# Patient Record
Sex: Male | Born: 1965 | Race: White | Hispanic: No | Marital: Single | Smoking: Current every day smoker
Health system: Southern US, Community
[De-identification: ages and names within clinical notes are randomized; demographics above are authoritative.]

## PROBLEM LIST (undated history)

## (undated) DIAGNOSIS — I509 Heart failure, unspecified: Secondary | ICD-10-CM

## (undated) DIAGNOSIS — I10 Essential (primary) hypertension: Secondary | ICD-10-CM

## (undated) DIAGNOSIS — G4730 Sleep apnea, unspecified: Secondary | ICD-10-CM

## (undated) HISTORY — DX: Heart failure, unspecified: I50.9

## (undated) HISTORY — PX: NO PAST SURGERIES: SHX2092

## (undated) HISTORY — DX: Essential (primary) hypertension: I10

## (undated) HISTORY — DX: Sleep apnea, unspecified: G47.30

---

## 2007-02-01 ENCOUNTER — Emergency Department (HOSPITAL_COMMUNITY): Admission: EM | Admit: 2007-02-01 | Discharge: 2007-02-01 | Payer: Self-pay | Admitting: Emergency Medicine

## 2007-02-01 IMAGING — CR DG HAND COMPLETE 3+V*R*
3 series · 3 of 3 positions shown · non-contrast
Comparison: none

CLINICAL DATA: Injury, pain

RIGHT HAND - 3 VIEW

[x hand pa right]
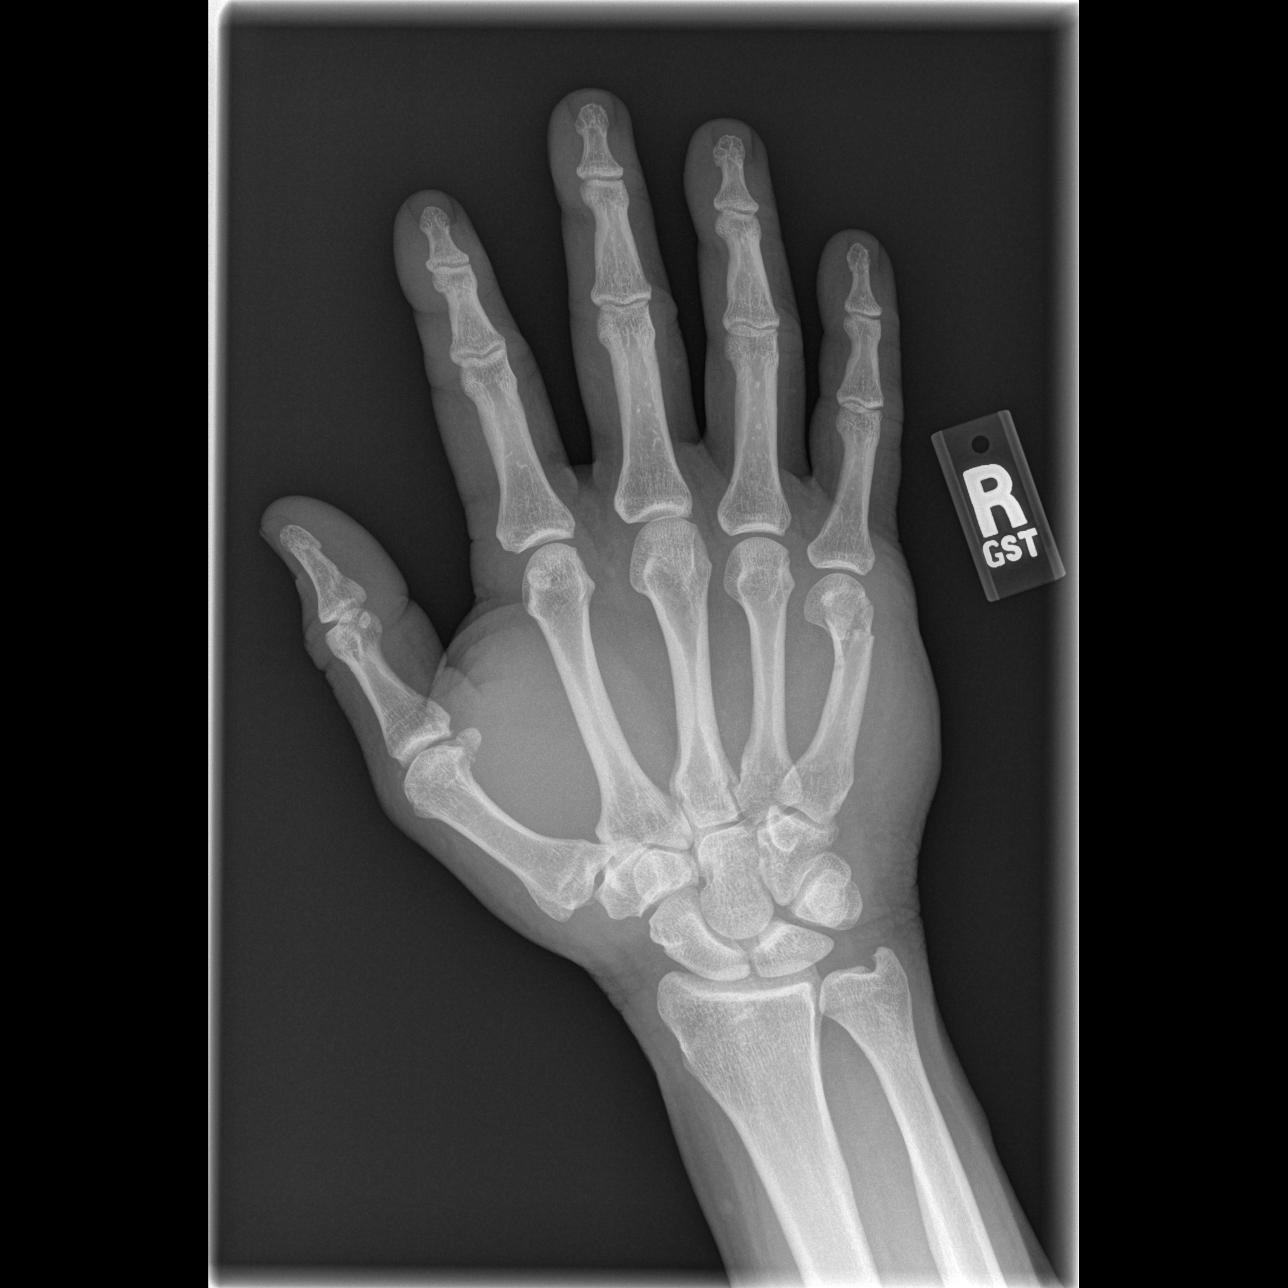

[x hand oblique right]
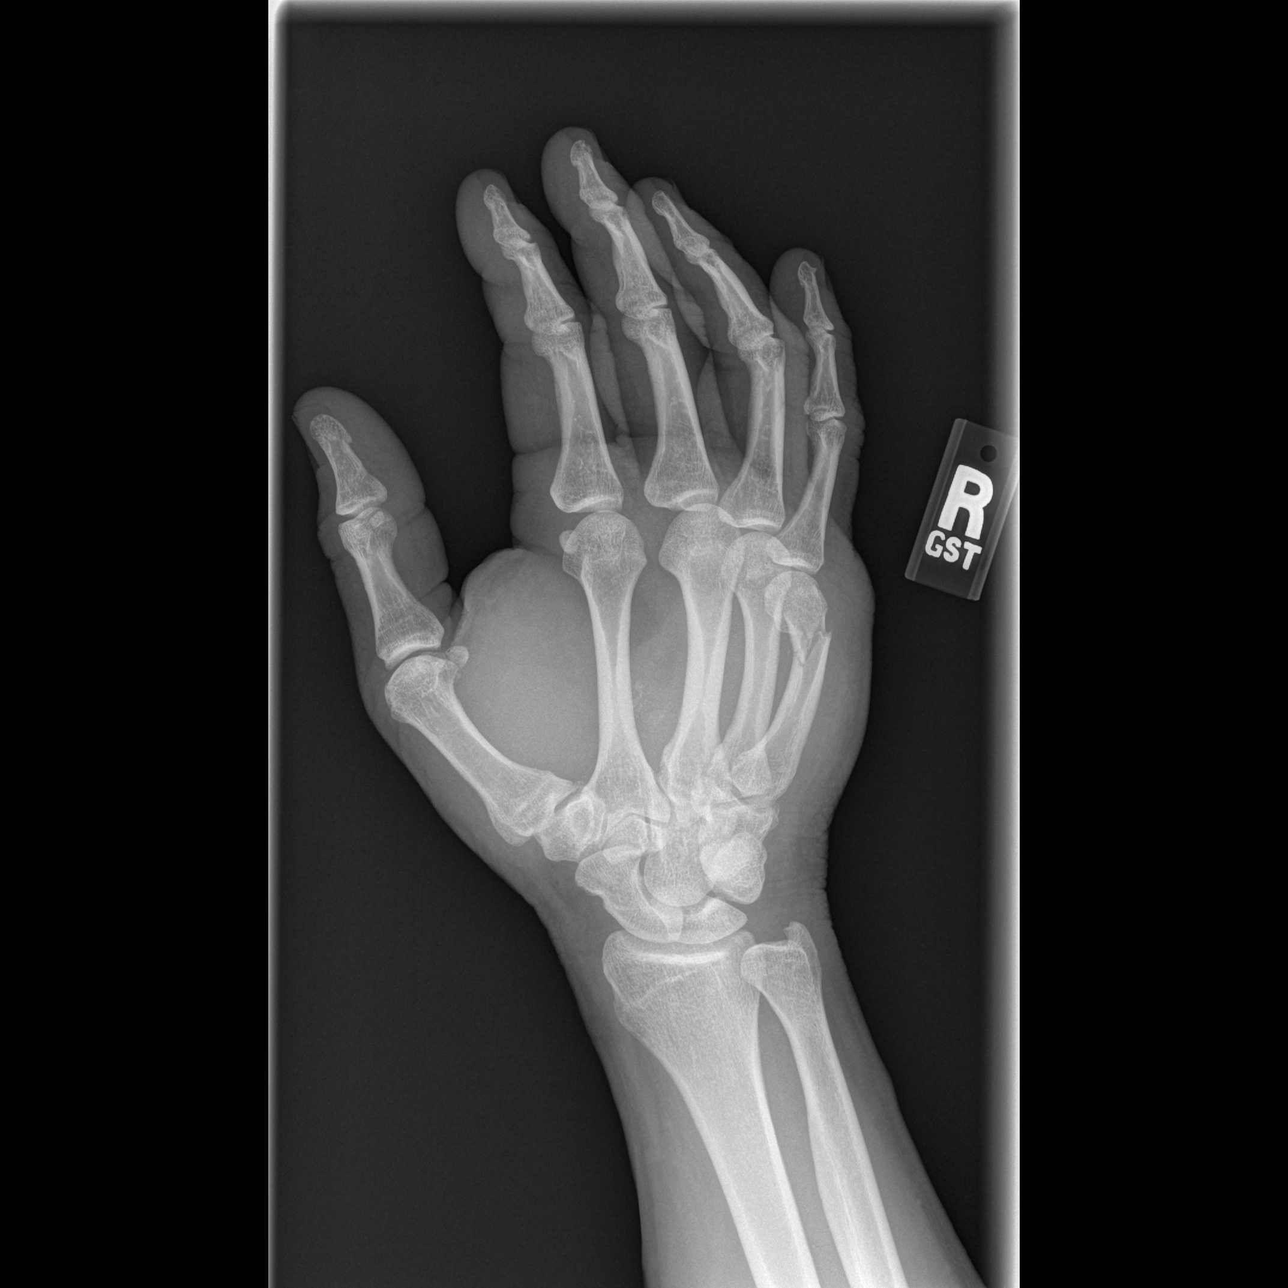

[x hand lat right]
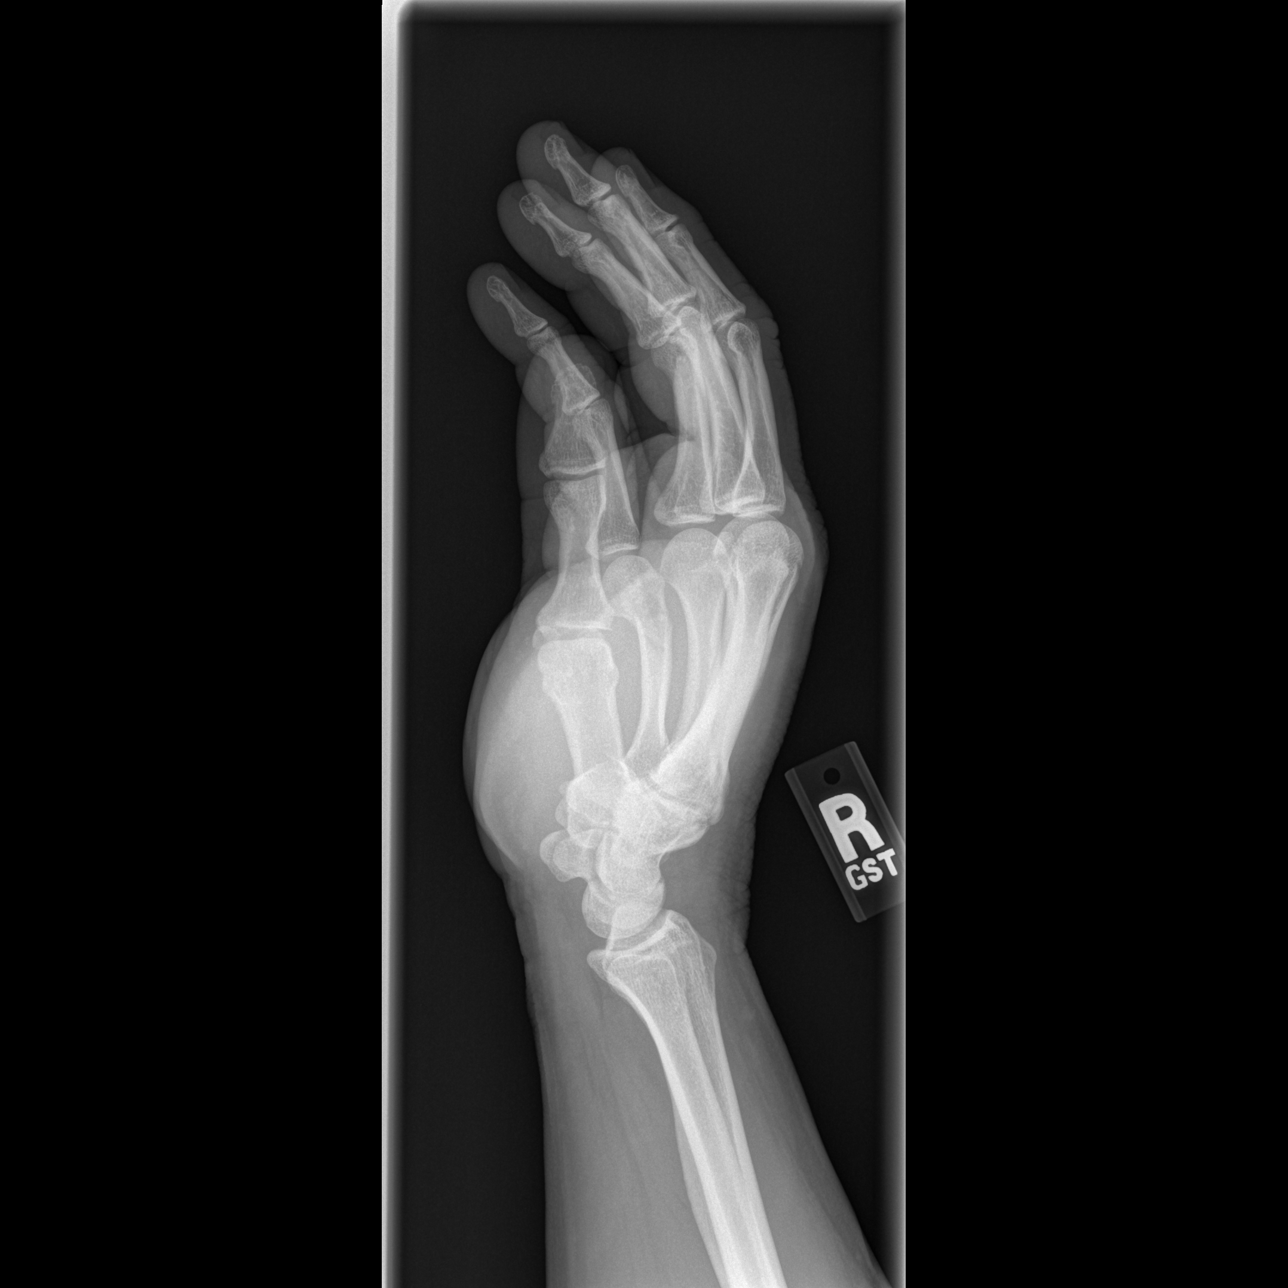

[3 of 3 positions shown; findings below may reference images not displayed]

FINDINGS: There is a comminuted fracture noted through the distal right fifth
metacarpal. Approximately 40 degrees of angulation. Overlying soft tissue
swelling. No acute bony abnormality.

IMPRESSION

Mildly comminuted and angulated distal right fifth metacarpal fracture.

## 2007-03-02 ENCOUNTER — Encounter: Payer: Self-pay | Admitting: Cardiology

## 2007-03-02 ENCOUNTER — Ambulatory Visit: Payer: Self-pay | Admitting: Cardiology

## 2007-03-02 ENCOUNTER — Inpatient Hospital Stay (HOSPITAL_COMMUNITY): Admission: EM | Admit: 2007-03-02 | Discharge: 2007-03-07 | Payer: Self-pay | Admitting: Emergency Medicine

## 2007-03-02 IMAGING — CR DG CHEST 1V PORT
1 series · 1 of 1 positions shown · non-contrast
Comparison: None available.

CLINICAL DATA: Chest pain.  
 PORTABLE SEATED AP CHEST - 1 VIEW [DATE]:

[view not recorded]
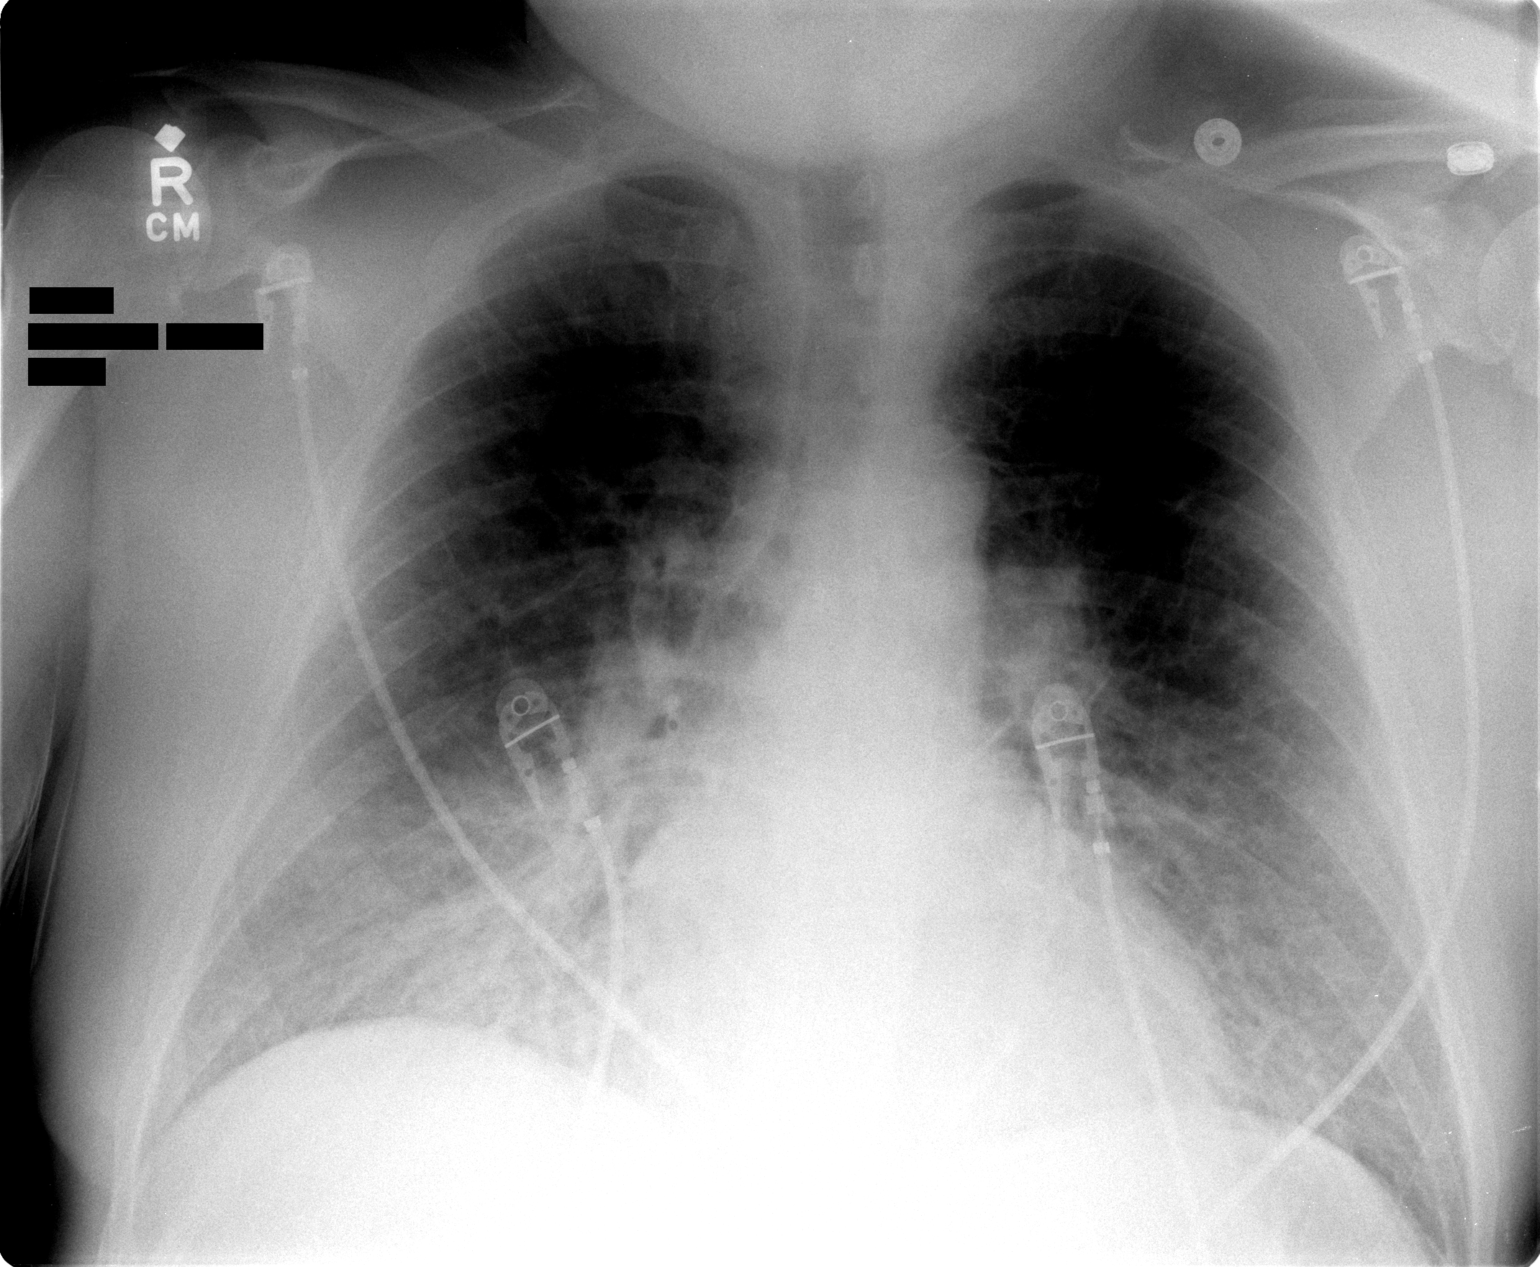

[1 of 1 positions shown; findings below may reference images not displayed]

FINDINGS: Cardio-pericardial silhouette is at upper limits of normal for projection. There is symmetric bilateral basilar airspace disease radiating out from the hila.  No pleural effusions are identified.  Left costophrenic angle excluded.
IMPRESSION: Findings most consistent with congestive heart failure with bibasilar pulmonary edema.  Findings were discussed with Dr. LAURENZZO of the Emergency Room at [82] hours on the day of exam.

## 2007-03-03 IMAGING — CR DG CHEST 2V
2 series · 2 of 2 positions shown · non-contrast
Comparison: Portable view, [DATE].

CLINICAL DATA: 40 year old male with congestive heart failure, shortness of breath, and chest pain. 
 CHEST - 2 VIEW - [DATE]:

[w chest pa]
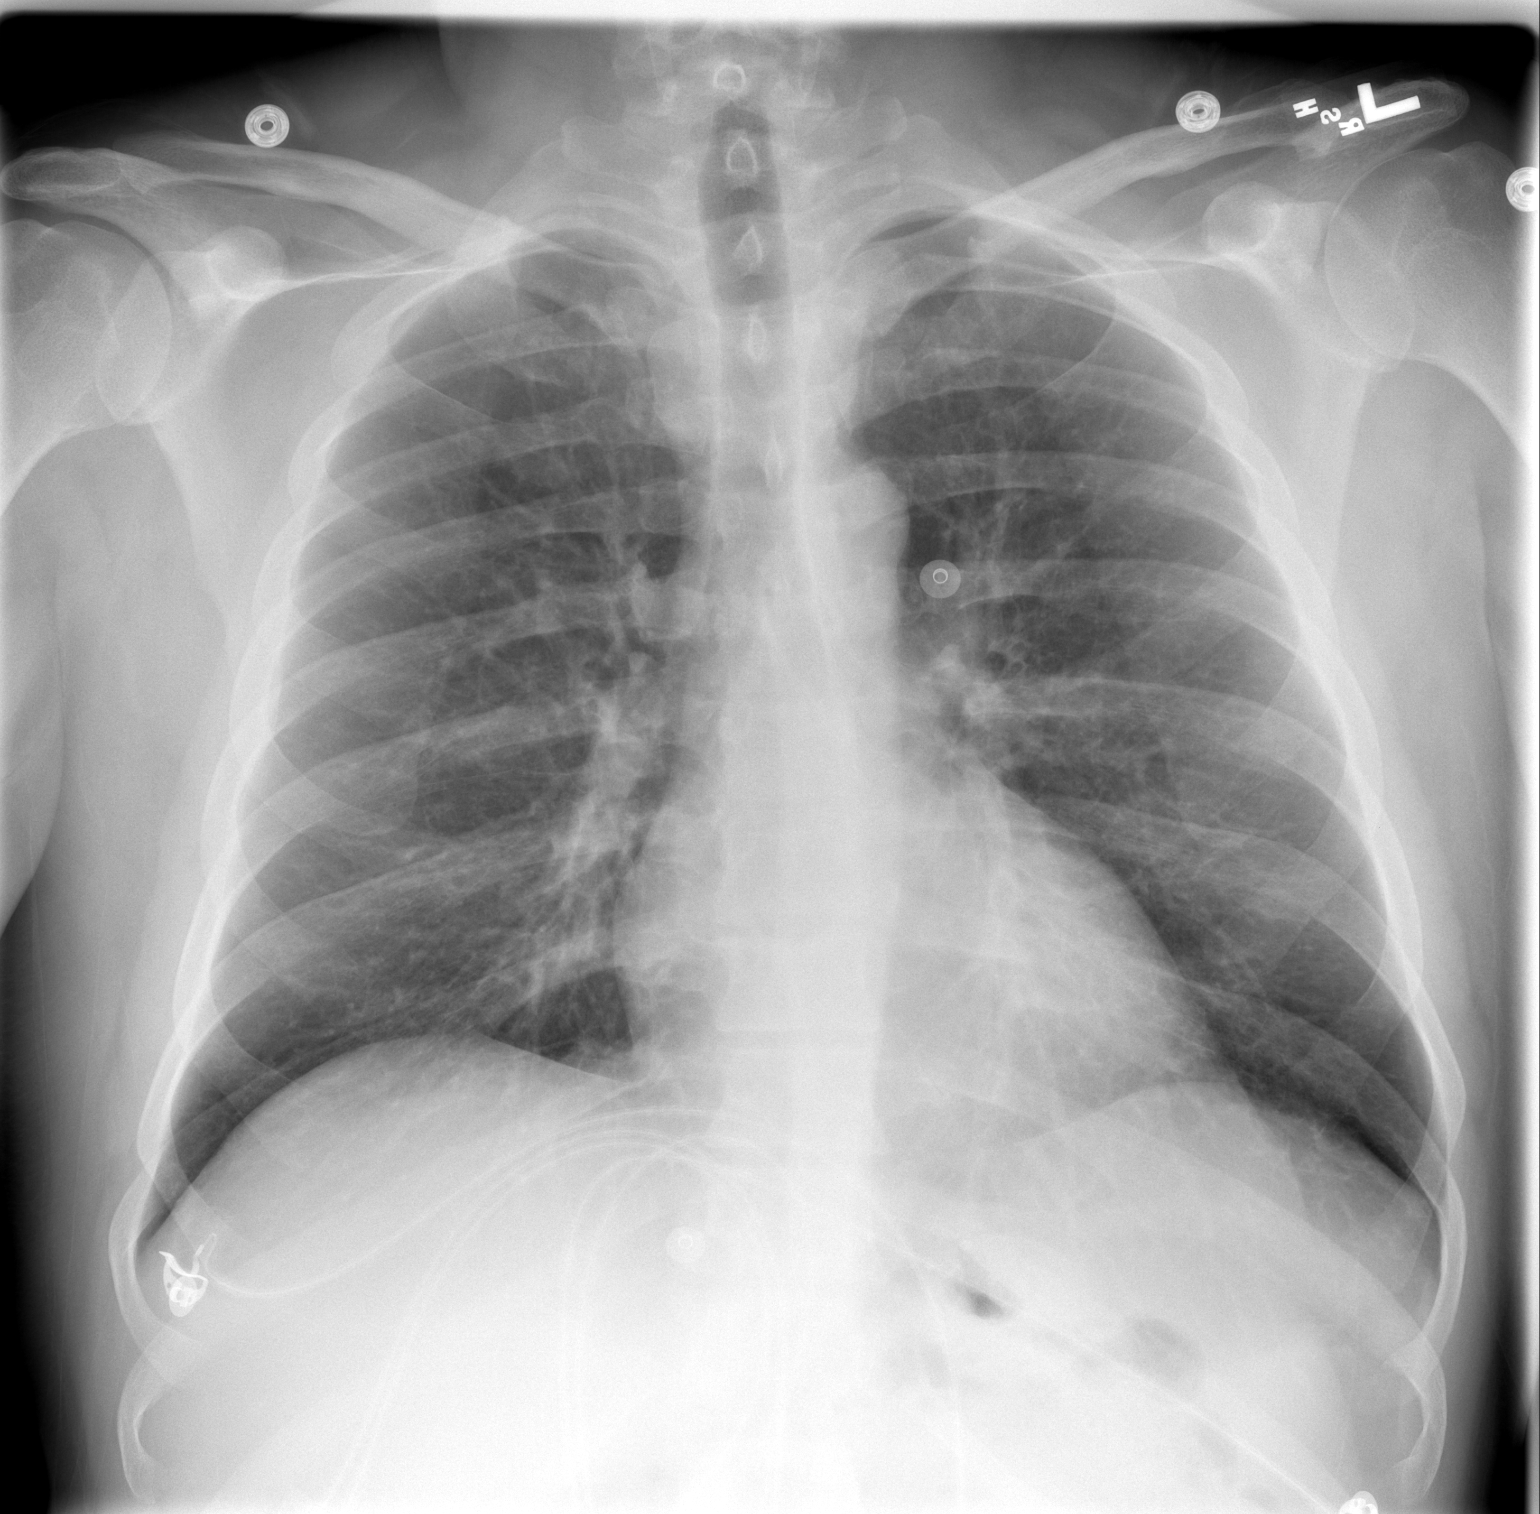

[w chest lat]
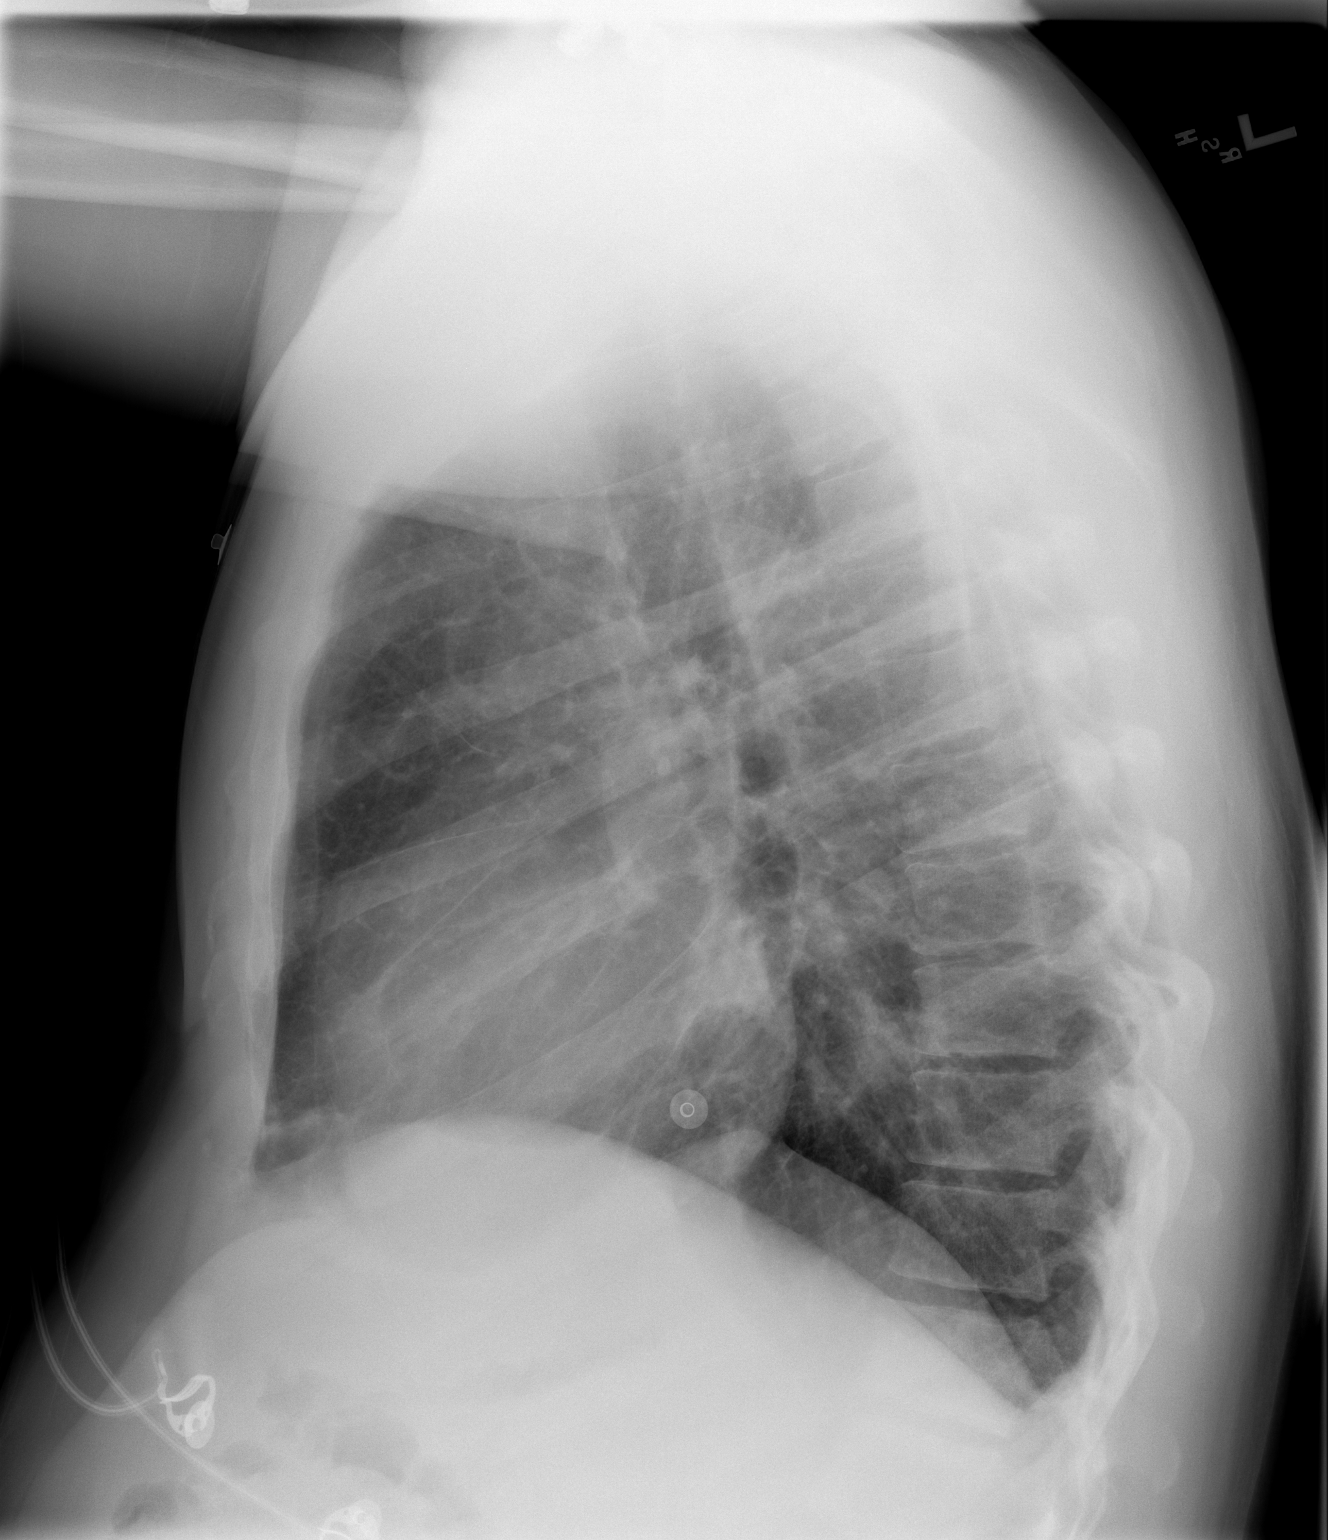

[2 of 2 positions shown; findings below may reference images not displayed]

FINDINGS: The cardiac size is within normal limits.  Normal mediastinal contour.  No pneumothorax.  Decreased pulmonary vascular congestion.  No pleural fluid.  No acute pulmonary edema evident.  No focal air space opacity.  No acute osseous abnormality seen.
IMPRESSION: Interval significantly decreased pulmonary vascular congestion, currently no acute edema or effusions.

## 2007-03-16 ENCOUNTER — Ambulatory Visit (HOSPITAL_BASED_OUTPATIENT_CLINIC_OR_DEPARTMENT_OTHER): Admission: RE | Admit: 2007-03-16 | Discharge: 2007-03-16 | Payer: Self-pay | Admitting: Cardiology

## 2007-03-16 ENCOUNTER — Encounter: Payer: Self-pay | Admitting: Pulmonary Disease

## 2007-04-07 ENCOUNTER — Ambulatory Visit: Payer: Self-pay | Admitting: Pulmonary Disease

## 2007-04-13 ENCOUNTER — Ambulatory Visit: Payer: Self-pay | Admitting: Cardiology

## 2007-05-08 ENCOUNTER — Ambulatory Visit: Payer: Self-pay | Admitting: Cardiology

## 2007-05-08 ENCOUNTER — Ambulatory Visit: Payer: Self-pay | Admitting: Pulmonary Disease

## 2007-05-08 DIAGNOSIS — G4733 Obstructive sleep apnea (adult) (pediatric): Secondary | ICD-10-CM

## 2007-05-08 DIAGNOSIS — I10 Essential (primary) hypertension: Secondary | ICD-10-CM | POA: Insufficient documentation

## 2007-05-08 DIAGNOSIS — I509 Heart failure, unspecified: Secondary | ICD-10-CM

## 2007-06-05 ENCOUNTER — Ambulatory Visit: Payer: Self-pay | Admitting: Pulmonary Disease

## 2007-09-21 ENCOUNTER — Encounter: Payer: Self-pay | Admitting: Cardiology

## 2007-09-21 ENCOUNTER — Ambulatory Visit: Payer: Self-pay

## 2007-10-16 ENCOUNTER — Ambulatory Visit: Payer: Self-pay | Admitting: Cardiology

## 2007-10-16 LAB — CONVERTED CEMR LAB
BUN: 22 mg/dL (ref 6–23)
CO2: 24 meq/L (ref 19–32)
Creatinine, Ser: 1.1 mg/dL (ref 0.4–1.5)
GFR calc non Af Amer: 79 mL/min
Glucose, Bld: 106 mg/dL — ABNORMAL HIGH (ref 70–99)
Potassium: 4.4 meq/L (ref 3.5–5.1)
Sodium: 137 meq/L (ref 135–145)

## 2008-04-17 ENCOUNTER — Ambulatory Visit: Payer: Self-pay | Admitting: Cardiology

## 2009-01-20 ENCOUNTER — Encounter (INDEPENDENT_AMBULATORY_CARE_PROVIDER_SITE_OTHER): Payer: Self-pay | Admitting: *Deleted

## 2009-02-06 ENCOUNTER — Ambulatory Visit: Payer: Self-pay | Admitting: Cardiology

## 2009-02-06 DIAGNOSIS — F17200 Nicotine dependence, unspecified, uncomplicated: Secondary | ICD-10-CM

## 2010-09-15 NOTE — H&P (Signed)
Cody Carpenter, Cody Carpenter               ACCOUNT NO.:  [id number]   MEDICAL RECORD NO.:  [id number]          PATIENT TYPE:  INP   LOCATION:  3707                         FACILITY:  [facility]   PHYSICIAN:  Rollene Rotunda, MD, FACCDATE OF BIRTH:  [birth year]   DATE OF ADMISSION:  03/02/2007  DATE OF DISCHARGE:                              HISTORY & PHYSICAL   PRIMARY CARDIOLOGIST:  New, Dr. Rollene Rotunda.   PRIMARY CARE PHYSICIAN:  The patient does not have one.   HISTORY OF PRESENT ILLNESS:  A 45 year old Caucasian male without prior  history of coronary artery disease, with complaints of one week of  progressive cold symptoms, cough and chest congestion.  Had been taking  DayQuil and Alka-Seltzer Plus Cold over the last week.  He had sinus  congestion, which was somewhat relieved, but then he states that the  congestion moved into his chest over the last few days.  Last night  around 11 p.m., he had worsening congestion, at 2 a.m., he had severe  shortness of breath, and 5:30 a.m., he had very difficult breathing,  unable to talk or lay flat, along with associated chest pressure, and  was gasping for breath.  His girlfriend brought him to the emergency  room.  At evaluation, the patient had a chest x-ray revealing acute  pulmonary edema, CHF.  He was given IV Lasix 40 mg, and has diuresed  approximately 1200 mL at the time of this evaluation.  The patient is  breathing much better, is able to talk, and is without complaints of  chest discomfort.  The patient has no prior cardiac history.  No  hypertension, diabetes.  He has no primary care physician.   REVIEW OF SYSTEMS:  Some lightheadedness this morning when he was having  trouble breathing, nasal congestion, shortness of breath, cough foamy  this morning, mild nausea.  Otherwise, negative.   PAST MEDICAL HISTORY:  None.   CURRENT MEDICATIONS:  None.   ALLERGIES:  None.   SOCIAL HISTORY:  The patient lives in [place] with his  girlfriend.  He is a Designer, fashion/clothing.  He has a 34-pack-year smoker, 2 packs a day.  He also  drinks approximately a 6-pack or more a day of beer.  No drug use.   FAMILY HISTORY:  Mother with hypertension, a brain aneurysm, prior MI  and frequent TIAs.  Father was killed when the patient was a child.  He  has a sister with congenital heart disease and surgery as an infant.   CURRENT LABORATORY DATA:  Hemoglobin 16.3, hematocrit 48.0, white blood  cells 10.2, platelets 212.  Sodium 139, potassium 4.5, chloride 110, CO2  of 40, BUN 18, creatinine 1.0, glucose 100.  Point of care markers:  Troponin less than 0.05, MB less than 1.0, CK 87.5, BNP 409, D-dimer  less than 0.40.  EKG revealing tachycardia with inferior lateral ST  depression, rhythm of 115 beats per minute, sinus tach.  Chest x-ray  reveals congestive heart failure with bibasilar pulmonary edema.   PHYSICAL EXAMINATION:  VITAL SIGNS:  Blood pressure is 147/80, pulse  104, respirations  16, temperature 97.8, O2 sat 93% on room air.  HEENT:  Head is normocephalic, atraumatic.  Eyes:  PERRLA.  Mucous  membranes, nasal congestion and runny nose is noted with frequent  sniffing and cough.  NECK:  Supple.  Positive JVD to the mandible.  There are no carotid  bruits appreciated.  CARDIOVASCULAR:  Regular rate and rhythm.  S3 murmur is auscultated.  Tachycardic.  LUNGS:  Bibasilar crackles fine without wheezes or rhonchi.  ABDOMEN:  Obese, nontender, with 2+ bowel sounds, hyperactive bowel  sounds.  No rebound or guarding. Mild hepatomegaly on palpation.  EXTREMITIES:  Without clubbing, cyanosis, edema or rash.  NEURO:  Cranial nerves II-XII are grossly intact.   IMPRESSION:  1. Acute congestive heart failure and pulmonary edema.  2. Tobacco abuse.  3. Alcohol abuse.   PLAN:  This is a 45 year old Caucasian male with symptoms of head cold  last night, positive paroxysmal nocturnal dyspnea.  Denies chest pain or  other anginal equivalent.   Denies previous cardiac history.  Positive  for alcohol and tobacco.  Chest x-ray reveals pulmonary edema with  elevation of BNP.  EKG reveals left atrial enlargement, left anterior  vesicular block, sinus tachycardia with PVCs.   We will admit the patient to rule out myocardial infarction.  Start the  patient on a heparin drip and low-dose angiotensin-converting enzyme  inhibitor.  Wait on beta blocker until echocardiogram is completed to  evaluate for decompensated congestive heart failure and for left  ventricular function.  We will place the patient on a nicotine patch and  alcohol-withdraw prophylaxis.  The patient will be followed closely with  strict I&O, daily weight, and monitoring of BMET and cardiac enzymes,  with questionable need for stress Myoview versus catheterization if  cardiac enzymes are positive.      Bettey Mare. Lyman Bishop, NP      Rollene Rotunda, MD, [facility]  Electronically Signed    KML/MEDQ  D:  03/02/2007  T:  03/02/2007  Job:  [id number]

## 2010-09-15 NOTE — Assessment & Plan Note (Signed)
[facility]                            CARDIOLOGY OFFICE NOTE   COULTER, OLDAKER                       MRN:          [id number]  DATE:05/08/2007                            DOB:          1966/04/03    PRIMARY:  None.   REASON FOR PRESENTATION:  Evaluate patient with nonischemic  cardiomyopathy.   HISTORY OF PRESENT ILLNESS:  The patient returns for follow-up of the  above.  He has a nonischemic cardiomyopathy.  At the last visit I did  increase his beta blocker.  He has felt a little bit lightheaded.  He  feels this when he stands up.  He has had no syncope or presyncope.  He  has not had any new shortness of breath.  He denies any chest  discomfort, neck or arm discomfort.  He does have dyspnea with moderate  exertion.  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  He is trying to get  disability and Medicaid.   PAST MEDICAL HISTORY:  Hypertension, nonischemic cardiomyopathy as  described, previous alcohol use (he says he is abstaining since his  admission in October), ongoing tobacco abuse (two packs per day), sleep  apnea (he saw Dr. Shelle Iron for the first time today).   ALLERGIES:  None.   MEDICATIONS:  1. Lisinopril 10 mg daily.  2. Coreg 12.5 mg daily.  3. Furosemide 40 mg b.i.d.   REVIEW OF SYSTEMS:  As stated in the HPI and otherwise negative for  other systems.   PHYSICAL EXAMINATION:  The patient is in no distress.  Blood pressure  116/73.  Heart rate: 89, irregular.  Weight: 231 pounds.  HEENT:  Eyes unremarkable.  Pupils equal, round and reactive to light.  Fundi not visualized.  Oral mucosa unremarkable.  NECK:  No jugular venous distention at 45 degrees.  Carotid upstroke  brisk and symmetric.  No bruits or thyromegaly.  LYMPHATICS:  No adenopathy.  LUNGS:  Clear to auscultation bilaterally.  BACK:  No costovertebral angle tenderness.  CHEST:  Unremarkable.  HEART:  PMI nondisplaced and sustained.  S1 and S2 within normal limits.  No S3, no S4.  No Clicks, rubs, or murmurs.  ABDOMEN:  Obese, positive bowel sounds.  Normal in frequency and pitch.  No bruits, rebound, guarding; no midline pulsatile mass, hepatomegaly,  splenomegaly.  SKIN:  No rashes.  No nodules.  EXTREMITIES:  2+ pulses.  No edema.   ASSESSMENT/PLAN:  1. Cardiomyopathy.  The patient as a dilated cardiomyopathy.  He has      class 1-2 symptoms at this point.  I am going to continue the      medications as listed.  I do not think he will be able to up-      titrate because of his dizziness.  He is having some mild      orthostatic symptoms.  When he comes back he is going to need an      echocardiogram to see if his ejection fraction is improved.  Hopefully he will be wearing the continuous positive airway      pressure, which may also lead to some improvement in his ejection      fraction.  Eventually he will need electrophysiology evaluation if      he has more than class 1 symptoms with his nonischemic      cardiomyopathy.  2. Tobacco abuse.  He is encouraged to quit smoking altogether.  3. Follow-up.  We will see him back in four months or sooner if      needed.     Rollene Rotunda, MD, [facility]  Electronically Signed    JH/MedQ  DD: 05/08/2007  DT: 05/08/2007  Job #: [id number]

## 2010-09-15 NOTE — Procedures (Signed)
NAMECARRIE, Cody Carpenter NO.:  [id number]   MEDICAL RECORD NO.:  [id number]          PATIENT TYPE:  OUT   LOCATION:  SLEEP CENTER                 FACILITY:  [facility]   PHYSICIAN:  Barbaraann Share, MD,FCCPDATE OF BIRTH:  07-14-1966   DATE OF STUDY:  03/16/2007                            NOCTURNAL POLYSOMNOGRAM   REFERRING PHYSICIAN:  Rollene Rotunda, MD, [facility]   INDICATIONS FOR PROCEDURE:  Hypersomnia with sleep apnea.   RESULTS:  Epward score 12.   SLEEP ARCHITECTURE:  The patient had total sleep time of 263 minutes  with no slow wave sleep and decreased REM. Sleep onset latency was rapid  at 9 minutes and REM onset was normal at 85 minutes. Sleep efficiency  was decreased at 68%.   RESPIRATORY DATA:  The patient was found to have 53 obstructive apnea's,  17 central apnea's, and 97 obstructive hypopnea's for an apnea/hypopnea  index of 38 events per hour. The events were not positional but there  was very loud snoring noted throughout.   OXYGEN DATA:  The patient was found to have O2 desaturation as low as  70% with the patient's obstructive events.   CARDIAC DATA:  Cardiac accelerations and decelerations were noted with  the patient's obstructive events but no clinically significant  arrhythmia.   MOVEMENT/PARASOMNIA:  None.   IMPRESSION/RECOMMENDATIONS:  1. Severe obstructive sleep apnea/hypopnea syndrome with an apnea      hypopnea index of 38 events per hour and O2 desaturation as low as      70%. Treatment for this degree of sleep apnea should focus      primarily on weight loss as well as CPAP.  2. Cardiac accelerations and decelerations were noted with the      patient's obstructive events, but no clinically significant      arrhythmias were noted.      Barbaraann Share, MD,FCCP  Diplomate, American Board of Sleep  Medicine  Electronically Signed     KMC/MEDQ  D:  04/08/2007 10:53:19  T:  04/08/2007 20:46:10  Job:  [id number]

## 2010-09-15 NOTE — Assessment & Plan Note (Signed)
[facility]                            CARDIOLOGY OFFICE NOTE   MAHIN, GUARDIA                       MRN:          [id number]  DATE:04/17/2008                            DOB:          06-27-1965    REASON FOR PRESENTATION:  Evaluate the patient with nonischemic  cardiomyopathy.   HISTORY OF PRESENT ILLNESS:  The patient is 45 years old.  Presents for  followup of the above.  His ejection fraction improved to 55% from 20%.  He has done relatively well since I last saw him.  He is back to work.  He is wearing his CPAP for his sleep apnea.  He is not having any  shortness of breath, PND, or orthopnea.  He is not having any chest  pain.  He does have some palpitations mostly at night.  He describes  skipped beats.  He may have these for few seconds.  They go away and  then come back.  He has not had any presyncope or syncope associated  with this.   PAST MEDICAL HISTORY:  Hypertension, nonischemic cardiomyopathy (EF 20%,  now 55% thought to be related to possibly to alcohol.  He is back to  drinking a little alcohol), ongoing tobacco abuse (2 packs per day),  sleep apnea, alcohol abuse.   ALLERGIES:  None.   MEDICATIONS:  1. Carvedilol 12.5 mg b.i.d.  2. Lisinopril 10 mg daily.   REVIEW OF SYSTEMS:  As stated in the HPI and otherwise negative for  other systems.   PHYSICAL EXAMINATION:  GENERAL:  The patient is in no distress.  VITAL SIGNS:  Blood pressure 124/84, heart rate 75 and regular, weight  213 pounds, body mass index 31.  NECK:  No jugular venous distention at 45 degrees.  Carotid upstroke  brisk and symmetrical.  No bruits, no thyromegaly.  LYMPHATICS:  No  cervical, axillary, or inguinal adenopathy.  LUNGS:  Decreased breath sounds with diffuse expiratory wheezes.  BACK:  No costovertebral angle tenderness.  CHEST:  Unremarkable.  HEART:  PMI not displaced or sustained.  S1 and S2 within normal limits.  No S3, no S4.  No  clicks, no rubs, no murmurs.  ABDOMEN:  Flat, positive  bowel sounds normal in frequency and pitch.  No bruits, no rebound, no  guarding.  No midline pulsatile mass.  No hepatomegaly, no splenomegaly.  SKIN:  No rashes, no nodules.  EXTREMITIES:  2+ pulse, no edema.   ASSESSMENT AND PLAN:  1. Cardiomyopathy.  The patient's ejection fraction is much improved.      I warned him against resuming alcohol as he would be likely then to      have recurrent cardiomyopathy.  He will think about this.  2. Palpitations.  He is describing continued palpitations.  They sound      like isolated premature atrial or ventricular contractions.  I am      going to increase his carvedilol to 18.75 mg twice a day.  We      discussed for quite awhile the risk of low blood pressure and  dizziness.  If he has this to any degree, he should reduce the drug      back down to 12.5 mg twice a day and let me know.  He is to avoid      climbing on any ladders or being on any roofs when he first      initiates the higher dose.  He should only go back to his activity      if he is symptom free with no dizziness, presyncope, weakness on      the higher dose.  Again, we had clear discussion about this.  We      will see if this helps the palpitations as well.  If not and there      is no change, he can go back to the 12.5 mg dose twice a day.  If      he is having increased palpitations, he should let me know.  3. Tobacco.  We discussed the need to stop smoking.  I do not know      that he can comply with this, but he understands the implications      of continued tobacco abuse.  4. Alcohol as above.  I told him he needs to abstain completely.     Rollene Rotunda, MD, [facility]  Electronically Signed    JH/MedQ  DD: 04/17/2008  DT: 04/17/2008  Job #: [id number]

## 2010-09-15 NOTE — Assessment & Plan Note (Signed)
[facility]                            CARDIOLOGY OFFICE NOTE   ANAND, TEJADA                      MRN:          [id number]  DATE:04/13/2007                            DOB:          29-Nov-1965    PRIMARY CARE PHYSICIAN:  None   REASON FOR PRESENTATION:  Evaluate patient with non-ischemic  cardiomyopathy.   HISTORY OF PRESENT ILLNESS:  The patient returns after hospitalization  on October 30 with acute systolic heart failure. He was found to have an  ejection fraction of 20%. Cardiac catheterization demonstrated no  evidence of coronary disease. He was noted to have significant alcohol  use as well as sleep apnea. These were both felt to be because of his  non-ischemic cardiomyopathy.   The patient has had problems since discharge. He has been taking the  medications listed below. He has not been as dyspneic as he was at the  time of presentation and has had no PND or orthopnea. 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 He has also had episodes  frequently where he feels his heart skipping. He gets completely drained  with this. He describes isolated beats that are somewhat frequent, but  not any sustained episodes. There is no pre-syncope or syncope. He has  no chest discomfort, neck or arm discomfort. He is under a significant  amount of stress with his finances. He is trying to get disability and  Medicaid.   PAST MEDICAL HISTORY:  1. Hypertension.  2. Non-ischemic cardiomyopathy as described.  3. Alcohol use (he has been abstaining since his admission).  4. Ongoing tobacco abuse (two pack per day).   ALLERGIES:  None.   CURRENT MEDICATIONS:  1. Lisinopril 10 mg daily.  2. Coreg 6.25 mg b.i.d.  3. Furosemide 40 mg b.i.d.   REVIEW OF SYSTEMS:  As stated in the HPI and otherwise negative for  other systems.   PHYSICAL EXAMINATION:  The  patient is in no acute distress. Blood  pressure 112/76, heart rate 88 and regular. Weight 229 pounds. Body Mass  Index is 33.  HEENT: Eyelids unremarkable. Pupils equal, round, and reactive to light.  Fundi not visualized. Oral mucosa is unremarkable.  NECK: No jugular venous distention, waveform within normal limits.  Carotid upstroke brisk and symmetrical. No bruits, no thyromegaly.  LYMPHATICS: No cervical, axillary or inguinal adenopathy.  LUNGS: Clear to auscultation bilaterally.  BACK: No costovertebral angle tenderness.  CHEST: Unremarkable.  HEART: PMI not displaced or sustained. S1, S2 within normal limits. No  S3. No S4. No murmurs.  ABDOMEN: Mildly obese, positive bowel sounds, normal in frequency and  pitch. No bruits. No rebounds. No guarding. No midline pulsatile mass.  No hepatomegaly, splenomegaly.  SKIN: No rashes, no nodules.  EXTREMITIES: 2+ pulses. No edema. No cyanosis or clubbing.  NEURO: Oriented to person, place and time. Cranial nerves II-XII grossly  intact. Motor grossly intact throughout.   ASSESSMENT/PLAN:  1. Cardiomyopathy. The patient has cardiomyopathy as described.  At      this point, he needs aggressive medication titration. I am going to      increase his Coreg to 12.5 mg b.i.d. He will let me know if he      tolerates this. He is going to continue his lisinopril and current      dose of furosemide. He is not drinking. I applaud this. We will      also get his sleep apnea treated as below.  2. Palpitations. The patient is having isolated sustained beats that      are probably ventricular in origin, but no pre-syncope or syncope.      He may eventually need a defibrillator, but we have to titrate his      medications first.  3. Sleep apnea. He did have an overnight polysomnogram in the hospital      to try to expedite the diagnosis of sleep apnea which was obvious      from history. He qualified for this and actually wore the CPAP, but      this  could not be arranged for home for reasons that are unclear to      me. I am going to set him up with Dr. Shelle Iron as soon as possible to      get this disease treated.  4. Followup. I will see the patient again in about four weeks.     Rollene Rotunda, MD, [facility]  Electronically Signed    JH/MedQ  DD: 04/13/2007  DT: 04/13/2007  Job #: [id number]   cc:   Barbaraann Share, MD,FCCP

## 2010-09-15 NOTE — Discharge Summary (Signed)
Cody Carpenter, Cody Carpenter               ACCOUNT NO.:  [id number]   MEDICAL RECORD NO.:  [id number]          PATIENT TYPE:  INP   LOCATION:  3707                         FACILITY:  [facility]   PHYSICIAN:  Jesse Sans. Wall, MD, FACCDATE OF BIRTH:  February 20, 1966   DATE OF ADMISSION:  03/02/2007  DATE OF DISCHARGE:  03/07/2007                         DISCHARGE SUMMARY - REFERRING   DISCHARGE DIAGNOSES:  1. Acute systolic congestive heart failure with ejection fraction of      approximately 20% by echocardiogram and cardiac catheterization.  2. Hypertension.  3. Tobacco use.  4. Alcohol use.  5. Hyperlipidemia.  6. Obstructive sleep apnea.   PROCEDURES PERFORMED:  Cardiac catheterization by Dr. Maurine Cane on  March 06, 2007.   SUMMARY OF HISTORY:  Mr. Cody Carpenter is a 45 year old white male who  describes at least 1 week of progressive cold symptoms which he  described as a cough/chest congestion for which he has been taking  DayQuil and Alka-Seltzer without relief.  However, on the evening prior  to admission at approximately 11:00 p.m. his symptoms worsened.  By 2:00  a.m. he had severe shortness of breath, and by 6:00 a.m. he found it  difficult to breathe and developed chest discomfort due to gasping for  breath.  His girlfriend drove him to the emergency room where he  received IV Lasix with 500 mL urine output with improvement of his  shortness of breath.  He denies prior occurrences.   The patient does not have any past medical history that has been  documented.  He does smoke approximately 2 packs per day and drinks at  least a 6-pack of beer daily.   LABORATORY:  Admission chest x-ray on October 30 showed congestive heart  failure with bibasilar pulmonary edema.  Repeat chest x-ray on October  31 showed interval significant decrease in pulmonary vascular congestion  without any overt edema or effusions.  Admission H&H was 16.3 and 48,  normal indices, platelets 212, WBCs 10.10.  On  November 3 H&H was 15.4  and 45.7, normal indices, platelets 203, WBC 7.9.  D-dimer was 0.40, PTT  34, PT 12.1.  Admission sodium 141, potassium 4.2, BUN 15, creatinine  1.09, glucose 93, normal LFTs.  On October 4 prior to discharge sodium  is 134, potassium 3.9, BUN 23, creatinine 1.07, glucose 110.  CK-MBs  relative indices and troponins were within normal limits x3.  Admission  BNP was 409, at the time of discharge on October 4 it was less than 30.  Fasting lipids showed a total cholesterol of 170, triglycerides 290, HDL  26, LDL 86.  TSH was 2.626.  EKGs showed normal sinus rhythm, left axis  deviation, delayed R-wave, PVC, slight widening of the QRS, nonspecific  ST-T wave changes.   HOSPITAL COURSE:  The patient was admitted to [facility] by  Bettey Mare. Lyman Bishop, nurse practitioner, and Dr. Daiva Nakayama.  He was  placed on IV heparin managed by pharmacy.  He was diuresed with IV  Lasix.  An echocardiogram was performed on October 30.  This showed an  ejection fraction of 20%  with anteroseptal akinesis and inferoseptal  akinesis, mild LVH.  Doppler parameters were consistent with a  restrictive physiology, decreased diastolic compliance, mild MR,  moderate left atrial enlargement, mild right atrial enlargement, RV  function was also diminished.  Nursing noted an 11-beat run of wide  complex tachycardia.  They also noted that during sleep he was having 20-  30 seconds of apnea alternating with snoring.  Dr. Antoine Poche on October  31 felt that he had probable sleep apnea.  Progression nurse assisted  with planning a sleep study.  Per outpatient CPAP management, during his  inpatient hospitalization he tolerated the CPAP well.  Progression nurse  faxed referral over for sleep study and they would be contacting him for  further evaluation.  Medications continued to be adjusted.  He was  counseled on tobacco cessation as well as alcohol cessation.  Respiratory assisted with CPAP.   After his first night of CPAP, the  patient reported that that evening was the best sleep he has had in 15  years.  On November 3 Dr. Eden Emms performed cardiac catheterization which  did not show any evidence of coronary artery disease.  His EF was 25%  with global hypokinesis.  Tobacco cessation consult was performed on  November 3 and on November 4 after review Dr. Daleen Squibb felt that the patient  could be discharged home.   DISPOSITION:  The patient is discharged home.  He is advised he may  return to work on March 13, 2007.  Wound care and activities in  regards to his catheterization are supplied per supplemental discharge  sheet.   NEW MEDICATIONS INCLUDE:  1. Aspirin 325 mg daily.  2. Coreg 6.25 mg b.i.d.  3. Lisinopril 10 mg daily.  4. Furosemide 40 mg b.i.d.  5. Chantix 0.5 mg daily for 3 days, then twice a day for 3 days, then      1 mg b.i.d.   Prior to his discharge he was meeting with financial counseling for  Medicaid application.  He was advised of daily weights and began a blood  pressure diary.  No smoking, tobacco products, or alcohol.  He was  advised to bring all medications, weights and blood pressure diary to  all follow-up appointments.  He will follow up with Dr. Antoine Poche within  the next 2-3 weeks in the office, and he will also be contacted by the  sleep study center for follow-up in regards to his obstructive sleep  apnea.   DISCHARGE TIME:  35 minutes.      Joellyn Rued, PA-C      Jesse Sans. Daleen Squibb, MD, [facility]  Electronically Signed    EW/MEDQ  D:  03/07/2007  T:  03/07/2007  Job:  [id number]   cc:   Rollene Rotunda, MD, [facility]  [facility]

## 2010-09-15 NOTE — Assessment & Plan Note (Signed)
[facility]                            CARDIOLOGY OFFICE NOTE   ED, [place]                       MRN:          [id number]  DATE:10/16/2007                            DOB:          [birth year]    PRIMARY:  None.   REASON OF PRESENTATION:  The patient with nonischemic cardiomyopathy.   HISTORY OF PRESENT ILLNESS:  The patient is 45 years old.  He presents  for followup of the above.  Since I last saw him, he had an  echocardiogram, which demonstrated that his ejection fraction is now  55%, improved from 20%.  There were no significant valvular  abnormalities.  Since that time, he continues to have fatigue, and says  he cannot go back to work.  He is having still some dyspnea despite the  improved ejection fraction.  He is having no chest discomfort, neck or  arm discomfort.  There has been no palpitation, presyncope, or syncope.  He has been diagnosed with severe sleep apnea, and is using CPAP, which  makes him feel much better, but still not back to baseline.   PAST MEDICAL HISTORY:  1. Hypertension.  2. Nonischemic cardiomyopathy (EF had been 20%, now 55%.  Thought to      be related possibly to alcohol.  He has been abstaining since his      admission in October).  3. Ongoing tobacco abuse (2 packs per day).  4. Sleep apnea.   ALLERGIES:  None.   MEDICATIONS:  1. Carvedilol 12.5 mg b.i.d.  2. Lisinopril 10 mg daily.  3. Furosemide 40 mg b.i.d.   REVIEW OF SYSTEMS:  As stated in HPI and otherwise negative for other  systems.   PHYSICAL EXAMINATION:  The patient is in no distress.  Blood pressure  111/75, heart rate 81 and regular, weight 220 pounds, body mass index  32.  HEENT:  Eyes unremarkable; pupils equal, round, and reactive to light;  fundi not visualized; oral mucosa unremarkable.  NECK:  No jugular venous distention at 45 degrees, and carotid upstroke  brisk and symmetrical.  No bruits, thyromegaly.  LYMPHATICS:  No  cervical, axillary, or inguinal nodes.  LUNGS:  Clear to auscultation bilaterally.  BACK:  No costovertebral angle tenderness.  CHEST:  Unremarkable.  HEART:  PMI not displaced or sustained.  S1 and S2 within normal limits.  No S3.  No S4.  No clicks.  No rubs.  No murmurs.  ABDOMEN:  Obese, positive bowel sounds, normal in frequency and pitch.  No bruits, rebound, or guarding.  No midline pulsatile mass.  No  hepatomegaly or splenomegaly.  SKIN:  No rashes.  No nodules.  EXTREMITIES:  2+ pulse throughout.  No edema.  No cyanosis.  No  clubbing.  NEUROLOGIC:  Oriented to person, place, and time; cranial nerves II-XII  grossly intact; motor grossly intact.   EKG:  Sinus rhythm, rate 75, left axis deviation, intervals within  normal limits, nonspecific inferior and anterolateral T-wave changes,  not significantly changed from previous.   ASSESSMENT AND PLAN:  1. Cardiomyopathy.  The patient had nonischemic  cardiomyopathy with      normal coronaries on catheterization.  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  At this point, he will      continue with carvedilol and lisinopril.  I have encouraged him to      try to cut his Lasix down to 40 mg once a day, and then 20 mg a      day.  If he tolerates this, then off.  He may be able to use it      only as needed.  I am going to continue the Coreg and lisinopril,      however.  He is to continue abstaining from alcohol.  2. Tobacco.  He understands need to stop smoking completely.  3. Sleep apnea.  This is treated by Dr. Shelle Iron with continuous      positive airway pressure.  4. Follow up will be in 6 months.  I am going to get a BMET today.     Rollene Rotunda, MD, [facility]  Electronically Signed    JH/MedQ  DD: 10/16/2007  DT: 10/17/2007  Job #: [id number]

## 2010-09-15 NOTE — Consult Note (Signed)
Cody Carpenter, Cody Carpenter               ACCOUNT NO.:  [id number]   MEDICAL RECORD NO.:  [id number]          PATIENT TYPE:  INP   LOCATION:  3707                         FACILITY:  [facility]   PHYSICIAN:  Noralyn Pick. Eden Emms, MD, FACCDATE OF BIRTH:  July 13, 1965   DATE OF CONSULTATION:  DATE OF DISCHARGE:                                 CONSULTATION   INDICATIONS:  Cardiomyopathy, rule out coronary disease.  Catheterization was done with 6-French catheter from right femoral  artery.  A 7-French sheath from right femoral vein.   Right heart catheterization was done, due to congestive heart failure  and dyspnea.   Left main coronary is normal.   Left anterior descending artery was normal.  There is one large first  diagonal branch, which was normal.  Circumflex coronary artery was  normal.  There was one large obtuse marginal branch, which was normal.   Right coronary artery was dominant, it was normal.   The patient has no significant coronary artery disease.   RAO ventriculography showed global hypokinesis with moderate left  ventricular cavity enlargement.  There is no significant MR, EF was 25%.  Heart pressures showed the following:  Aortic pressure was 100/69.  LV  pressure was 100/19.   Mean right atrial pressure was 5, RV pressure was 29/9, PA pressure was  28/17, mean pulmonary capillary wedge pressure was 17.   IMPRESSION:  The patient would appear to have a non-ischemic  cardiomyopathy, possibly due to alcohol abuse.   He will be continued be treated medically.  He will follow up on the  heart failure clinic in 2 weeks.  He also has significant sleep apnea  and will need an outpatient sleep study.   His filling pressures were currently compensated.   He will be discharged in the morning.      Noralyn Pick. Eden Emms, MD, [facility]  Electronically Signed    PCN/MEDQ  D:  03/06/2007  T:  03/06/2007  Job:  [id number]   cc:   Rollene Rotunda, MD, [facility]

## 2010-10-06 ENCOUNTER — Other Ambulatory Visit: Payer: Self-pay | Admitting: Cardiology

## 2010-12-09 ENCOUNTER — Other Ambulatory Visit: Payer: Self-pay | Admitting: Cardiology

## 2011-02-09 LAB — HEPARIN LEVEL (UNFRACTIONATED)
Heparin Unfractionated: 0.48
Heparin Unfractionated: <0.1 — ABNORMAL LOW

## 2011-02-09 LAB — POCT I-STAT 3, ART BLOOD GAS (G3+)
Acid-base deficit: 1
Bicarbonate: 22.6
O2 Saturation: 95
Operator id: 151231

## 2011-02-09 LAB — BASIC METABOLIC PANEL
CO2: 24
CO2: 26
CO2: 28
Calcium: 9.5
Calcium: 9.7
Chloride: 100
Chloride: 101
Chloride: 102
Chloride: 103
Creatinine, Ser: 1.03
GFR calc Af Amer: >60
GFR calc Af Amer: >60
GFR calc Af Amer: >60
GFR calc Af Amer: >60
GFR calc non Af Amer: >60
GFR calc non Af Amer: >60
Glucose, Bld: 99
Sodium: 134 — ABNORMAL LOW
Sodium: 134 — ABNORMAL LOW
Sodium: 136

## 2011-02-09 LAB — PROTIME-INR
INR: 0.9
Prothrombin Time: 12.1

## 2011-02-09 LAB — CBC
HCT: 45.7
Hemoglobin: 15.4
MCHC: 34
MCV: 92.5
MCV: 93.4
Platelets: 211
RBC: 4.89
WBC: 7.9
WBC: 8.5

## 2011-02-09 LAB — POCT I-STAT 3, VENOUS BLOOD GAS (G3P V)
O2 Saturation: 73
Operator id: 151231
pH, Ven: 7.362 — ABNORMAL HIGH

## 2011-02-09 LAB — B-NATRIURETIC PEPTIDE (CONVERTED LAB): Pro B Natriuretic peptide (BNP): <30

## 2011-02-10 LAB — COMPREHENSIVE METABOLIC PANEL
ALT: 28
Alkaline Phosphatase: 68
BUN: 15
CO2: 24
Chloride: 106
Glucose, Bld: 93
Potassium: 4.2
Sodium: 141
Total Bilirubin: 0.7

## 2011-02-10 LAB — CBC
HCT: 48.2
Hemoglobin: 16.4
MCHC: 34
MCV: 93.3
Platelets: 212
RDW: 13.1
RDW: 13.7
WBC: 10.2

## 2011-02-10 LAB — I-STAT 8, (EC8 V) (CONVERTED LAB)
BUN: 18
Chloride: 110
Glucose, Bld: 100 — ABNORMAL HIGH
HCT: 52
Operator id: 282201
Potassium: 4.5
Sodium: 139
TCO2: 25
pCO2, Ven: 40 — ABNORMAL LOW

## 2011-02-10 LAB — HEPARIN LEVEL (UNFRACTIONATED): Heparin Unfractionated: 0.19 — ABNORMAL LOW

## 2011-02-10 LAB — BASIC METABOLIC PANEL
Calcium: 9.4
GFR calc non Af Amer: >60
Glucose, Bld: 111 — ABNORMAL HIGH
Sodium: 139

## 2011-02-10 LAB — DIFFERENTIAL
Basophils Absolute: 0.1
Basophils Relative: 1
Eosinophils Absolute: 0.5
Lymphs Abs: 2.3
Neutrophils Relative %: 66

## 2011-02-10 LAB — LIPID PANEL
Cholesterol: 170
LDL Cholesterol: 86
VLDL: 58 — ABNORMAL HIGH

## 2011-02-10 LAB — POCT I-STAT CREATININE: Creatinine, Ser: 1

## 2011-02-10 LAB — CARDIAC PANEL(CRET KIN+CKTOT+MB+TROPI)
CK, MB: 1.2
Total CK: 67
Total CK: 73
Troponin I: 0.03

## 2011-02-10 LAB — TSH: TSH: 2.626

## 2011-02-10 LAB — D-DIMER, QUANTITATIVE: D-Dimer, Quant: 0.4

## 2011-02-10 LAB — B-NATRIURETIC PEPTIDE (CONVERTED LAB): Pro B Natriuretic peptide (BNP): 409 — ABNORMAL HIGH

## 2011-08-24 ENCOUNTER — Other Ambulatory Visit: Payer: Self-pay | Admitting: Cardiology

## 2011-08-31 ENCOUNTER — Other Ambulatory Visit: Payer: Self-pay | Admitting: Cardiology

## 2011-08-31 NOTE — Telephone Encounter (Signed)
..   Requested Prescriptions   Pending Prescriptions Disp Refills  . lisinopril (PRINIVIL,ZESTRIL) 10 MG tablet [Pharmacy Med Name: LISINOPRIL 10MG      TAB] 30 tablet 3    Sig: TAKE ONE TABLET BY MOUTH EVERY DAY

## 2011-11-04 ENCOUNTER — Other Ambulatory Visit: Payer: Self-pay | Admitting: Cardiology

## 2011-11-15 ENCOUNTER — Other Ambulatory Visit: Payer: Self-pay | Admitting: Cardiology

## 2011-11-15 ENCOUNTER — Telehealth: Payer: Self-pay

## 2011-11-15 NOTE — Telephone Encounter (Signed)
Call patient to call Dr Antoine Poche Office to make appointment, To get refills on medication

## 2013-04-16 ENCOUNTER — Ambulatory Visit: Payer: Self-pay | Admitting: Cardiology

## 2013-05-02 ENCOUNTER — Encounter: Payer: Self-pay | Admitting: *Deleted

## 2013-05-11 ENCOUNTER — Encounter (INDEPENDENT_AMBULATORY_CARE_PROVIDER_SITE_OTHER): Payer: Self-pay

## 2013-05-11 ENCOUNTER — Ambulatory Visit (INDEPENDENT_AMBULATORY_CARE_PROVIDER_SITE_OTHER): Payer: Self-pay | Admitting: Cardiology

## 2013-05-11 ENCOUNTER — Encounter: Payer: Self-pay | Admitting: Cardiology

## 2013-05-11 VITALS — BP 156/103 | HR 100 | Ht 72.0 in | Wt 251.6 lb

## 2013-05-11 DIAGNOSIS — I471 Supraventricular tachycardia: Secondary | ICD-10-CM

## 2013-05-11 DIAGNOSIS — I509 Heart failure, unspecified: Secondary | ICD-10-CM

## 2013-05-11 DIAGNOSIS — I10 Essential (primary) hypertension: Secondary | ICD-10-CM

## 2013-05-11 LAB — TSH: TSH: 0.43 u[IU]/mL (ref 0.35–5.50)

## 2013-05-11 MED ORDER — CARVEDILOL 6.25 MG PO TABS: 6.2500 mg | ORAL_TABLET | Freq: Two times a day (BID) | ORAL | Status: DC

## 2013-05-11 NOTE — Progress Notes (Signed)
Re-establish care and f/u on apnea and HTN  HPI:   HTN: Pt reports seeing dots for the last 1 year when gets too hot or stands too fast or gets stressed. He also reports a racing heart the last 6 months and feels like it's beating harder. This sensation is intermittent and not associated with dizziness/syncope. Denies chest pain/discomfort/pressure, dyspnea, swelling, blurred vision, nausea, vomiting, diaphoresis, or dizziness/syncope. In the past was on "heart medication and a BP med" but stopped due to cost of "something that cost $400."   Sleep apnea: Pt reports using CPAP nightly and "it really helps."  CHF: Pt denies sudden increased weight but reports slow weight gain "due to food" over the past 3 years. He denies PND or orthopnea. He does not take anything for CHF. He feels his extremities are no more swollen than normal.  Tobacco use: He continues smoking 3/4 ppd, which is down from 2ppd a few years ago. He tried nicotine patches before which made him nauseated. He is currently trying vapor/e-cigs.  No Known Allergies  No current meds  No current facility-administered medications for this visit.   Past Medical History  Diagnosis Date  . CHF (congestive heart failure)   HTN Sleep apnea Tobacco abuse  Past Surgical History  Procedure Laterality Date  . None      History reviewed. No pertinent family history.  History   Social History  . Marital Status: Single    Spouse Name: N/A    Number of Children: N/A  . Years of Education: N/A   Occupational History  . Not on file.   Social: 3/4 pack per day x 25, down from 2 packs per day, cut back 2 years ago. Corporate investment bankerConstruction worker Lives with GF Alcohol - None since Oct 2013. Drugs - none. No children.  Other Topics Concern  . Not on file   Social History Narrative  . No narrative on file   ROS:  Past year bottom of feet have been throbbing/feeling bruised with no skin changes.  PHYSICAL EXAM BP 156/103   Pulse 100  Ht 6' (1.829 m)  Wt 251 lb 9.6 oz (114.125 kg)  BMI 34.12 kg/m2 GENERAL:  Well appearing HEENT:  Pupils equal round and reactive, fundi not visualized, oral mucosa unremarkable NECK:  No jugular venous distention, no bruits, no thyromegaly LYMPHATICS:  No cervical, inguinal adenopathy  LUNGS:  Clear to auscultation bilaterally BACK:  No CVA tenderness CHEST:  Unremarkable HEART:  PMI not displaced or sustained,S1 and S2 within normal limits, no S3, S4 is present, no clicks, no rubs, no murmurs ABD:  Flat, positive bowel sounds normal in frequency in pitch, no bruits, no rebound, no guarding, no midline pulsatile mass, no hepatomegaly, no splenomegaly EXT:  2 plus pulses throughout, trace LE edema right>left, nontender; no cyanosis no clubbing SKIN:  No rashes no nodules NEURO:  Cranial nerves II through XII grossly intact, motor grossly intact throughout PSYCH:  Cognitively intact, oriented to person place and time   EKG: Sinus tachycardia, LVH, LAD, poor R wave progression likely lead placement with no q waves.  05/11/2013   ASSESSMENT AND PLAN  CHF: Uncertain if current edema is baseline. Patient denies symptoms of CHF exacerbation. Echocardiogram 2008 EF 20%, 2009 EF 55%.  - Will start carvedilol 6.25mg  PO BID. - F/u in 1 month, at which point will also perform echocardiogram.  HTN: BP elevated in clinic today and seeing spots are likely from elevated pressures at home. - Restart  b-blocker, consider asa and statin - At f/u consider checking lipids if not done recently. - Pt needs to establish with PCP.  TOBACCO ABUSE: - Trying e-cigarettes. Doesn't stop craving much  But slows them down. - Nicotine patch in past has made him nauseated  Because "too strong". - Encouraged continued tobacco cessation efforts.  APNEA: No symptoms, uses CPAP machine. - Continue using CPAP machine at night.   RACING HEART: Current sinus tachycardia on EKG.  - Coreg likely to help with  this. - Will check TSH today.   Leona Singleton, MD [facility] resident   History and all data above reviewed.  Patient examined.  I agree with the findings as above.  The patient exam reveals [id number], positive S4 ,  Lungs: Clear  ,  Abd: Positive bowel sounds, no rebound no guarding, Ext Trace edeam  .  All available labs, radiology testing, previous records reviewed. Agree with documented assessment and plan. The patient presents predominantly with tachycardia. At this point I'm going to start with evaluation to include an echocardiogram given his past history of cardiomyopathy. I am going to check some blood work to include a TSH. I am going to start a low dose beta blocker for management of this and his hypertension. Further evaluation treatment are as above.  Fayrene Fearing Dev Dhondt  5:19 PM  05/11/2013

## 2013-05-11 NOTE — Patient Instructions (Signed)
Please start carvedilol 6.25 mg one twice a day. Continue all other medications as listed  Please have blood work today  (TSH)  Your physician has requested that you have an echocardiogram in 1 month. Echocardiography is a painless test that uses sound waves to create images of your heart. It provides your doctor with information about the size and shape of your heart and how well your heart's chambers and valves are working. This procedure takes approximately one hour. There are no restrictions for this procedure.  Follow up in 1 month with Dr Antoine Poche.

## 2013-05-21 ENCOUNTER — Encounter: Payer: Self-pay | Admitting: Cardiology

## 2013-06-12 ENCOUNTER — Ambulatory Visit (HOSPITAL_COMMUNITY): Payer: Self-pay | Attending: Internal Medicine | Admitting: Cardiology

## 2013-06-12 ENCOUNTER — Ambulatory Visit: Payer: Self-pay | Admitting: Cardiology

## 2013-06-12 DIAGNOSIS — I10 Essential (primary) hypertension: Secondary | ICD-10-CM | POA: Insufficient documentation

## 2013-06-12 DIAGNOSIS — F17200 Nicotine dependence, unspecified, uncomplicated: Secondary | ICD-10-CM | POA: Insufficient documentation

## 2013-06-12 DIAGNOSIS — I471 Supraventricular tachycardia: Secondary | ICD-10-CM

## 2013-06-12 DIAGNOSIS — I509 Heart failure, unspecified: Secondary | ICD-10-CM | POA: Insufficient documentation

## 2013-06-12 NOTE — Progress Notes (Signed)
Echo performed. 

## 2013-06-13 ENCOUNTER — Telehealth: Payer: Self-pay | Admitting: Cardiology

## 2013-06-13 NOTE — Telephone Encounter (Signed)
New Problem: ° °Pt is requesting a call back to hear recent test results.  °

## 2013-06-13 NOTE — Telephone Encounter (Signed)
Left pt a message to call back. 

## 2013-06-14 ENCOUNTER — Encounter: Payer: Self-pay | Admitting: *Deleted

## 2013-06-14 NOTE — Telephone Encounter (Signed)
Pt aware of echo results 

## 2013-06-18 ENCOUNTER — Ambulatory Visit (INDEPENDENT_AMBULATORY_CARE_PROVIDER_SITE_OTHER): Payer: Self-pay | Admitting: Cardiology

## 2013-06-18 ENCOUNTER — Encounter: Payer: Self-pay | Admitting: Cardiology

## 2013-06-18 VITALS — BP 132/84 | HR 83 | Ht 72.0 in | Wt 253.4 lb

## 2013-06-18 DIAGNOSIS — I509 Heart failure, unspecified: Secondary | ICD-10-CM

## 2013-06-18 DIAGNOSIS — I10 Essential (primary) hypertension: Secondary | ICD-10-CM

## 2013-06-18 MED ORDER — LISINOPRIL 5 MG PO TABS: 5.0000 mg | ORAL_TABLET | Freq: Every day | ORAL | Status: DC

## 2013-06-18 NOTE — Patient Instructions (Signed)
Your physician has recommended you make the following change in your medication:   1. Start lisinopril 5 mg 1 tablet by mouth daily.  Your physician recommends that you schedule a follow-up appointment in: 1 month with Dr. Antoine Poche.

## 2013-06-18 NOTE — Progress Notes (Signed)
   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  His EF had been 280% in 2008 but improved with medical management. Unfortunately when he came back to see [place] most recently he was off of all of his medications. A followup echo demonstrated his EF to now be back down from 55% to about 25-30%. He's been describing some rapid heart rate. He's not been having any PND or orthopnea. He's not been having any chest pressure, neck or arm discomfort. We did start low-dose beta blocker he seems to have tolerated this. He's not describing any presyncope or syncope.  No Known Allergies  Current Outpatient Prescriptions  Medication Sig Dispense Refill  . carvedilol (COREG) 6.25 MG tablet Take 1 tablet (6.25 mg total) by mouth 2 (two) times daily.  60 tablet  11   No current facility-administered medications for this visit.    Past Medical History  Diagnosis Date  . CHF (congestive heart failure)     Past Surgical History  Procedure Laterality Date  . None      ROS:  As stated in the HPI and negative for all other systems.  PHYSICAL EXAM BP 132/84  Pulse 83  Ht 6' (1.829 m)  Wt 253 lb 6.4 oz (114.941 kg)  BMI 34.36 kg/m2 GENERAL:  Well appearing HEENT:  Pupils equal round and reactive, fundi not visualized, oral mucosa unremarkable NECK:  No jugular venous distention, waveform within normal limits, carotid upstroke brisk and symmetric, no bruits, no thyromegaly LYMPHATICS:  No cervical, inguinal adenopathy LUNGS:  Clear to auscultation bilaterally BACK:  No CVA tenderness CHEST:  Unremarkable HEART:  PMI not displaced or sustained,S1 and S2 within normal limits, no S3, no S4, no clicks, no rubs, no murmurs ABD:  Flat, positive bowel sounds normal in frequency in pitch, no bruits, no rebound, no guarding, no midline pulsatile mass, no hepatomegaly, no splenomegaly EXT:  2 plus pulses throughout, no edema, no cyanosis no clubbing SKIN:  No  rashes no nodules NEURO:  Cranial nerves II through XII grossly intact, motor grossly intact throughout PSYCH:  Cognitively intact, oriented to person place and time  EKG:  Sinus rhythm, rate 83, left axis deviation, interventricular conduction delay, nonspecific inferior and lateral T-wave changes unchanged from previous. 06/18/2013  ASSESSMENT AND PLAN  HTN:  This is being managed in the context of treating his CHF  CHF:  Today I am going to titrate his medications by adding lisinopril 5 mg daily. I'll see him back in about one month with plans to likely increase the beta blocker.  TOBACCO ABUSE:  We have educated him and he is in the process of trying to quit.  SLEEP APNEA:  He uses a CPAP.

## 2013-07-19 ENCOUNTER — Ambulatory Visit: Payer: Self-pay | Admitting: Cardiology

## 2013-08-30 ENCOUNTER — Ambulatory Visit: Payer: Self-pay | Admitting: Cardiology

## 2013-09-18 ENCOUNTER — Encounter: Payer: Self-pay | Admitting: Cardiology

## 2013-09-18 ENCOUNTER — Ambulatory Visit (INDEPENDENT_AMBULATORY_CARE_PROVIDER_SITE_OTHER): Payer: Self-pay | Admitting: Cardiology

## 2013-09-18 VITALS — BP 110/78 | HR 70 | Ht 67.0 in | Wt 242.0 lb

## 2013-09-18 DIAGNOSIS — I509 Heart failure, unspecified: Secondary | ICD-10-CM

## 2013-09-18 DIAGNOSIS — F17200 Nicotine dependence, unspecified, uncomplicated: Secondary | ICD-10-CM

## 2013-09-18 DIAGNOSIS — G4733 Obstructive sleep apnea (adult) (pediatric): Secondary | ICD-10-CM

## 2013-09-18 MED ORDER — CARVEDILOL 6.25 MG PO TABS: 9.3750 mg | ORAL_TABLET | Freq: Two times a day (BID) | ORAL | Status: DC

## 2013-09-18 NOTE — Progress Notes (Signed)
   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  His EF had been 20% in 2008 but improved with medical management. Unfortunately when he came back to see [place] most recently he was off of all of his medications. A followup echo demonstrated his EF to now be back down from 55% to about 25-30%.   He returns from med titration. He's having some rare palpitations when he becomes emotionally stressed. He's not having any dyspnea with exertion, PND or orthopnea. He's not been having any chest pressure, neck or arm discomfort. He doesn't describe any orthostatic symptoms. Not having any presyncope or syncope. He tolerated the addition of an ACE inhibitor at the last appointment.  No Known Allergies  Current Outpatient Prescriptions  Medication Sig Dispense Refill  . carvedilol (COREG) 6.25 MG tablet Take 1 tablet (6.25 mg total) by mouth 2 (two) times daily.  60 tablet  11  . lisinopril (PRINIVIL,ZESTRIL) 5 MG tablet Take 1 tablet (5 mg total) by mouth daily.  90 tablet  3   No current facility-administered medications for this visit.    Past Medical History  Diagnosis Date  . CHF (congestive heart failure)     Past Surgical History  Procedure Laterality Date  . None      ROS:  As stated in the HPI and negative for all other systems.  PHYSICAL EXAM BP 110/78  Pulse 70  Ht 5\' 7"  (1.702 m)  Wt 242 lb (109.77 kg)  BMI 37.89 kg/m2 GENERAL:  Well appearing NECK:  No jugular venous distention, waveform within normal limits, carotid upstroke brisk and symmetric, no bruits, no thyromegaly LUNGS:  Clear to auscultation bilaterally CHEST:  Unremarkable HEART:  PMI not displaced or sustained,S1 and S2 within normal limits, no S3, no S4, no clicks, no rubs, no murmurs ABD:  Flat, positive bowel sounds normal in frequency in pitch, no bruits, no rebound, no guarding, no midline pulsatile mass, no hepatomegaly, no splenomegaly EXT:  2 plus pulses  throughout, no edema, no cyanosis no clubbing   EKG:  Sinus rhythm, rate 72, nonspecific inferior and lateral T-wave flattening.  He has had axis shift and narrowing of his QRS since his most recent EKG. 09/18/2013  ASSESSMENT AND PLAN  HTN:  This is being managed in the context of treating his CHF  CHF:  Today I am going to titrate his medications by increasing the Coreg very slightly.  I have to move slowly with this as he does climb ladders and I don't want him to be lightheaded.  TOBACCO ABUSE:  We have educated him and he is in the process of trying to quit.  SLEEP APNEA:  He uses a CPAP.  He would like a follow up with Dr. Shelle Iron and we will help him with this.

## 2013-09-18 NOTE — Patient Instructions (Addendum)
INCREASE YOUR CARVDILOL TO 6.25 MG 1 AND 1/2 TABLETS TWICE A DAY, NEW RX SENT TO [facility]   APPOINTMENT WITH DR [facility] ON June 17 AT 10:30. Arrive around 15-30 minutes early with your insurance cards, photo ID and list of medications. If you need to reschedule call them at [phone]  Your physician recommends that you schedule a follow-up appointment in: 4 MONTHS

## 2013-10-17 ENCOUNTER — Institutional Professional Consult (permissible substitution): Payer: Self-pay | Admitting: Pulmonary Disease

## 2013-11-07 ENCOUNTER — Encounter: Payer: Self-pay | Admitting: Pulmonary Disease

## 2013-11-07 ENCOUNTER — Ambulatory Visit (INDEPENDENT_AMBULATORY_CARE_PROVIDER_SITE_OTHER): Payer: Self-pay | Admitting: Pulmonary Disease

## 2013-11-07 VITALS — BP 120/84 | HR 88 | Temp 98.6°F | Ht 68.0 in | Wt 240.6 lb

## 2013-11-07 DIAGNOSIS — G4733 Obstructive sleep apnea (adult) (pediatric): Secondary | ICD-10-CM

## 2013-11-07 NOTE — Patient Instructions (Signed)
Will have your current machine checked to make sure it is working properly.   You need to get a new mask and head gear. Keep working on weight loss If you are doing well after getting a new mask, come back and see me in a year.  If you are still not doing well, please call so we can make some adjustments to your pressure.

## 2013-11-07 NOTE — Assessment & Plan Note (Signed)
The patient has a history of severe obstructive sleep apnea, and is currently having breakthrough apneas and nonrestorative sleep. He is also having worsening daytime sleepiness. It is unclear whether this is a mask and headgear issue, or whether he needs more pressure. We also need to make sure that his machine is working properly. Will get him back to a home care company to have all of this evaluated, and I have encouraged him to work aggressively on weight loss. He will followup with me in one year if doing well, but will continue to trouble shoot his CPAP if he still has issues.

## 2013-11-07 NOTE — Progress Notes (Signed)
   Subjective:    Patient ID: Cody Carpenter, male    DOB: 1965/09/05, 48 y.o.   MRN: [id number]  HPI The patient is a 48 year old male who comes in today to reestablish for obstructive sleep apnea. He was last seen back in 2008 where he was found to have severe OSA, and was started on CPAP. He did very well with the device initially, but unfortunately has not followed up because of a lack of insurance. He is now having worsening sleep with breakthrough apneas. He is sleeping very poorly, and has significant sleepiness during the day with inactivity. He has gained some weight from his last sleep study, but most recently has been losing weight. He is wearing his CPAP compliantly, but he has not changed his mask in almost 3 years and his headgear is being held together by safety pins.    Review of Systems  Constitutional: Negative for fever and unexpected weight change.  HENT: Negative for congestion, dental problem, ear pain, nosebleeds, postnasal drip, rhinorrhea, sinus pressure, sneezing, sore throat and trouble swallowing.   Eyes: Negative for redness and itching.  Respiratory: Positive for shortness of breath. Negative for cough, chest tightness and wheezing.   Cardiovascular: Negative for palpitations and leg swelling.  Gastrointestinal: Negative for nausea and vomiting.  Genitourinary: Negative for dysuria.  Musculoskeletal: Negative for joint swelling.  Skin: Negative for rash.  Neurological: Negative for headaches.  Hematological: Does not bruise/bleed easily.  Psychiatric/Behavioral: Negative for dysphoric mood. The patient is not nervous/anxious.        Objective:   Physical Exam Overweight male in no acute distress Nose without purulence or discharge noted, but severe septal deviation to the left with near obstruction Neck without lymphadenopathy or thyromegaly No skin breakdown or pressure necrosis from the CPAP mask Chest with rhonchi and a few crackles Cardiac exam with  regular rate and rhythm Lower extremities without significant edema, no cyanosis Awake, but does appear mildly sleepy, moves all 4 extremities.       Assessment & Plan:

## 2013-11-14 ENCOUNTER — Telehealth: Payer: Self-pay | Admitting: Pulmonary Disease

## 2013-11-14 NOTE — Telephone Encounter (Signed)
Per Order 11/07/13 1) check machine and make sure is functioning properly 2) needs new mask and head gear.  I have sent [facility] a staff message to check on this. Pt aware will keep him updated

## 2013-11-16 NOTE — Telephone Encounter (Signed)
Melissa called back. They do not see any insurance on file. They will process his order but if he has no insurance then he will be private pay. They will also call Cody Carpenter and make him aware.   Spoke with Cody Carpenter and he does not have insurance. He will call back if this is too expensive. Nothing further needed

## 2013-11-16 NOTE — Telephone Encounter (Signed)
I called spoke with Melissa. She reports it states it is holding more information but is going to check with Barbara Cower to make this has been received. Will await call back

## 2013-11-28 ENCOUNTER — Telehealth: Payer: Self-pay | Admitting: Pulmonary Disease

## 2013-11-28 NOTE — Telephone Encounter (Signed)
Spoke with pt, he is aware that [facility] is out of the office until Monday but will address this paperwork next week.  The paperwork is for a cpap pt assistance program.  In pt's last ov note, it states that the pt has a cpap already.  Pt states that he took his machine to [facility] and it was evaluated, [facility] told him that his machine's engine was failing and it would not be able to be repaired.  He wishes to pick up this paperwork when it is completed.  Will place in [facility] look-at and forward to nurse to follow up on.

## 2013-12-03 NOTE — Telephone Encounter (Signed)
Forms placed in [facility] green folder. Cire Deyarmin, CMA  

## 2013-12-06 ENCOUNTER — Other Ambulatory Visit: Payer: Self-pay | Admitting: Pulmonary Disease

## 2013-12-06 DIAGNOSIS — G4733 Obstructive sleep apnea (adult) (pediatric): Secondary | ICD-10-CM

## 2013-12-06 NOTE — Telephone Encounter (Signed)
done

## 2013-12-06 NOTE — Telephone Encounter (Signed)
Cody Carpenter, has the pt been contacted? Do you have the completed forms?

## 2013-12-06 NOTE — Telephone Encounter (Signed)
Cody Carpenter for the pt explaining that we have faxed the forms. We do need to have the pt give an email address, this is required for the program. I have placed the forms at the front. [name] advised the pt on the message that we do need an email address and that he needs to speak with someone when he comes to pick up the forms. Carron Curie, CMA

## 2013-12-06 NOTE — Telephone Encounter (Addendum)
[id number] to speak with the pt. These forms need to be processed through our officew not given back to the pt. I have spoken with Cody Carpenter and she is going to look into this and let me know what needs to be done.  Carron Curie, CMA

## 2014-01-22 ENCOUNTER — Ambulatory Visit (INDEPENDENT_AMBULATORY_CARE_PROVIDER_SITE_OTHER): Payer: Self-pay | Admitting: Cardiology

## 2014-01-22 ENCOUNTER — Encounter: Payer: Self-pay | Admitting: Cardiology

## 2014-01-22 VITALS — BP 114/80 | HR 73 | Ht 67.0 in | Wt 240.8 lb

## 2014-01-22 DIAGNOSIS — I10 Essential (primary) hypertension: Secondary | ICD-10-CM

## 2014-01-22 MED ORDER — CARVEDILOL 12.5 MG PO TABS: 12.5000 mg | ORAL_TABLET | Freq: Two times a day (BID) | ORAL | Status: DC

## 2014-01-22 NOTE — Patient Instructions (Signed)
Your physician recommends that you schedule a follow-up appointment in: one month with an extender  Take Coreg 12.5g two times a day as directed

## 2014-01-22 NOTE — Progress Notes (Signed)
   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  His EF had been 20% in 2008 but improved with medical management. Unfortunately when he came back to see [place] most recently he was off of all of his medications. A followup echo demonstrated his EF to now be back down from 55% to about 25-30%.     He returns from med titration.  At the last visit I felt up on his beta blocker dose he did well with this. He's had no lightheadedness. He's not having any dyspnea with exertion, PND or orthopnea. He's not been having any chest pressure, neck or arm discomfort. He doesn't describe any orthostatic symptoms. He is not having any presyncope or syncope.  No Known Allergies  Current Outpatient Prescriptions  Medication Sig Dispense Refill  . carvedilol (COREG) 6.25 MG tablet Take 1.5 tablets (9.375 mg total) by mouth 2 (two) times daily.  270 tablet  3  . lisinopril (PRINIVIL,ZESTRIL) 5 MG tablet Take 1 tablet (5 mg total) by mouth daily.  90 tablet  3   No current facility-administered medications for this visit.    Past Medical History  Diagnosis Date  . CHF (congestive heart failure)   . Hypertension     Past Surgical History  Procedure Laterality Date  . No past surgeries      ROS:  As stated in the HPI and negative for all other systems.  PHYSICAL EXAM BP 114/80  Pulse 73  Ht 5\' 7"  (1.702 m)  Wt 240 lb 12.8 oz (109.226 kg)  BMI 37.71 kg/m2 GENERAL:  Well appearing NECK:  No jugular venous distention, waveform within normal limits, carotid upstroke brisk and symmetric, no bruits, no thyromegaly LUNGS:  Clear to auscultation bilaterally HEART:  PMI not displaced or sustained,S1 and S2 within normal limits, no S3, no S4, no clicks, no rubs, no murmurs ABD:  Flat, positive bowel sounds normal in frequency in pitch, no bruits, no rebound, no guarding, no midline pulsatile mass, no hepatomegaly, no splenomegaly EXT:  2 plus pulses throughout,  no edema, no cyanosis no clubbing   EKG:  Sinus rhythm, rate 73, nonspecific inferior and lateral T-wave flattening and lateral person. Left axis deviation, interventricular conduction delay, no change this.   01/22/2014  ASSESSMENT AND PLAN  HTN:  This is being managed in the context of treating his CHF  CHF:  Today I am going to titrate his medications by increasing the Coreg very slightly to 12.5 bid.  I have to move slowly with this as he does climb ladders and I don't want him to be lightheaded.  TOBACCO ABUSE:  We have educated him and he is in the process of trying to quit but he finds this very hard.  SLEEP APNEA:  He had follow up with Dr. Shelle Iron and has a new CPAP.

## 2014-01-28 ENCOUNTER — Other Ambulatory Visit: Payer: Self-pay | Admitting: *Deleted

## 2014-01-28 ENCOUNTER — Telehealth: Payer: Self-pay | Admitting: Cardiology

## 2014-01-28 NOTE — Telephone Encounter (Signed)
He can not afford the medicine that was called in last week. Please see if Dr Cortland Lions can presctibe a feneric,something less expensive.

## 2014-01-28 NOTE — Telephone Encounter (Signed)
I have talked to The pharmacist at  [address] [place] and they also told me that the 12.5 mg will be $40.00 and the 6.25 mg would be $4.00 . Pt. Stated he could not afford $40.00, Haig Prophet informed of this matter and is going to work on getting something something cheaper tomorrow, pt. informed

## 2014-01-30 ENCOUNTER — Other Ambulatory Visit: Payer: Self-pay | Admitting: Pharmacist Clinician (PhC)/ Clinical Pharmacy Specialist

## 2014-01-30 MED ORDER — CARVEDILOL 12.5 MG PO TABS: 12.5000 mg | ORAL_TABLET | Freq: Two times a day (BID) | ORAL | Status: DC

## 2014-02-21 ENCOUNTER — Ambulatory Visit: Payer: Self-pay | Admitting: Cardiology

## 2014-03-07 ENCOUNTER — Ambulatory Visit: Payer: Self-pay | Admitting: Cardiology

## 2014-03-27 ENCOUNTER — Encounter: Payer: Self-pay | Admitting: Physician Assistant

## 2014-03-27 ENCOUNTER — Ambulatory Visit (INDEPENDENT_AMBULATORY_CARE_PROVIDER_SITE_OTHER): Payer: Self-pay | Admitting: Physician Assistant

## 2014-03-27 VITALS — BP 126/80 | HR 68 | Ht 67.0 in | Wt 239.0 lb

## 2014-03-27 DIAGNOSIS — I5020 Unspecified systolic (congestive) heart failure: Secondary | ICD-10-CM

## 2014-03-27 NOTE — Progress Notes (Signed)
Patient ID: Cody Carpenter, male   DOB: 16-Nov-1965, 48 y.o.   MRN: [id number]    Date:  03/27/2014   ID:  Cody Carpenter, DOB [birth year], MRN [id number]  PCP:  No PCP Per Patient  Primary Cardiologist:  Hochrein     History of Present Illness: Cody Carpenter is a 48 y.o. 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 His EF had been 20% in 2008 but improved with medical management. Unfortunately when he came back to see [place] most recently he was off of all of his medications. A followup echo demonstrated his EF to now be back down from 55% to about 25-30%.  The patient presents for follow up after his coreg increase.  He reports no dizziness, orthopnea, LEE, SOB.  He feels good.  Wears his CPAP.  Can't sem to give up the cigarettes.    The patient currently denies nausea, vomiting, fever, chest pain,  PND, cough, congestion, abdominal pain, hematochezia, melena.  Wt Readings from Last 3 Encounters:  03/27/14 239 lb (108.41 kg)  01/22/14 240 lb 12.8 oz (109.226 kg)  11/07/13 240 lb 9.6 oz (109.135 kg)     Past Medical History  Diagnosis Date  . CHF (congestive heart failure)   . Hypertension     Current Outpatient Prescriptions  Medication Sig Dispense Refill  . carvedilol (COREG) 12.5 MG tablet Take 1 tablet (12.5 mg total) by mouth 2 (two) times daily. 180 tablet 3  . lisinopril (PRINIVIL,ZESTRIL) 5 MG tablet Take 1 tablet (5 mg total) by mouth daily. 90 tablet 3   No current facility-administered medications for this visit.    Allergies:   No Known Allergies  Social History:  The patient  reports that he has been smoking Cigarettes.  He has a 25 pack-year smoking history. He does not have any smokeless tobacco history on file. He reports that he does not drink alcohol or use illicit drugs.   Family history:   Family History  Problem Relation Age of Onset  . Heart disease Maternal Grandfather   . Lung cancer Maternal Grandmother     ROS:   Please see the history of present illness.  All other systems reviewed and negative.   PHYSICAL EXAM: VS:  BP 126/80 mmHg  Pulse 68  Ht 5\' 7"  (1.702 m)  Wt 239 lb (108.41 kg)  BMI 37.42 kg/m2 Obese, well developed, in no acute distress HEENT: Pupils are equal round react to light accommodation extraocular movements are intact.  Cardiac: Regular rate and rhythm without murmurs rubs or gallops. Lungs:  clear to auscultation bilaterally, no wheezing, rhonchi or rales Ext: no lower extremity edema.  2+ radial pulses. Skin: warm and dry Neuro:  Grossly normal       ASSESSMENT AND PLAN:   HTN:  Well controlled.  Coreg and Ace  CHF:  Coreg was increased to 12.5 bid at his last OV.  His weight is stable.  He appears eucvolemic.  No dizziness.  Continue current dose.    TOBACCO ABUSE: Cessation discussed.  It does not sound like it will ever happen. 3/4 of a pack per day.  He has tried patches and chantix.  SLEEP APNEA: Follow up with Dr. Shelle Iron.  Has a new CPAP.   Obesity:   Reduced carbohydrate diet discussed.  Resources provided to help with food choices.

## 2014-03-27 NOTE — Patient Instructions (Signed)
Your physician wants you to follow-up in: 6 Months with Dr Hochrein. You will receive a reminder letter in the mail two months in advance. If you don't receive a letter, please call our office to schedule the follow-up appointment.  

## 2014-05-20 ENCOUNTER — Telehealth: Payer: Self-pay | Admitting: Cardiology

## 2014-05-20 NOTE — Telephone Encounter (Signed)
Pt need a prescription for his Lisinopril #90.Please call to [facility] on [address],he did not know the phone number.

## 2014-05-22 NOTE — Telephone Encounter (Signed)
°  1. Which medications need to be refilled? Lisinopril  2. Which pharmacy is medication to be sent to? [facility] on [address]  3. Do they need a 30 day or 90 day supply? 30  4. Would they like a call back once the medication has been sent to the pharmacy? yes

## 2014-05-29 ENCOUNTER — Other Ambulatory Visit: Payer: Self-pay

## 2014-05-29 MED ORDER — LISINOPRIL 5 MG PO TABS: 5.0000 mg | ORAL_TABLET | Freq: Every day | ORAL | Status: DC

## 2014-06-06 ENCOUNTER — Encounter (HOSPITAL_COMMUNITY): Payer: Self-pay | Admitting: Emergency Medicine

## 2014-06-06 ENCOUNTER — Emergency Department (HOSPITAL_COMMUNITY)
Admission: EM | Admit: 2014-06-06 | Discharge: 2014-06-07 | Disposition: A | Discharge status: Home / Self Care | Payer: Self-pay | Attending: Emergency Medicine | Admitting: Emergency Medicine

## 2014-06-06 DIAGNOSIS — K625 Hemorrhage of anus and rectum: Secondary | ICD-10-CM

## 2014-06-06 DIAGNOSIS — K602 Anal fissure, unspecified: Secondary | ICD-10-CM | POA: Insufficient documentation

## 2014-06-06 DIAGNOSIS — I509 Heart failure, unspecified: Secondary | ICD-10-CM | POA: Insufficient documentation

## 2014-06-06 DIAGNOSIS — I10 Essential (primary) hypertension: Secondary | ICD-10-CM | POA: Insufficient documentation

## 2014-06-06 DIAGNOSIS — K649 Unspecified hemorrhoids: Secondary | ICD-10-CM | POA: Insufficient documentation

## 2014-06-06 DIAGNOSIS — Z720 Tobacco use: Secondary | ICD-10-CM | POA: Insufficient documentation

## 2014-06-06 DIAGNOSIS — Z79899 Other long term (current) drug therapy: Secondary | ICD-10-CM | POA: Insufficient documentation

## 2014-06-06 LAB — COMPREHENSIVE METABOLIC PANEL
ALBUMIN: 3.8 g/dL (ref 3.5–5.2)
ALT: 24 U/L (ref 0–53)
ANION GAP: 8 (ref 5–15)
AST: 22 U/L (ref 0–37)
Alkaline Phosphatase: 84 U/L (ref 39–117)
BUN: 10 mg/dL (ref 6–23)
CHLORIDE: 106 mmol/L (ref 96–112)
CO2: 25 mmol/L (ref 19–32)
Calcium: 9.2 mg/dL (ref 8.4–10.5)
Creatinine, Ser: 0.95 mg/dL (ref 0.50–1.35)
GFR calc Af Amer: >90 mL/min (ref >90)
GFR calc non Af Amer: >90 mL/min (ref >90)
Glucose, Bld: 95 mg/dL (ref 70–99)
Potassium: 3.9 mmol/L (ref 3.5–5.1)
SODIUM: 139 mmol/L (ref 135–145)
Total Bilirubin: 0.5 mg/dL (ref 0.3–1.2)
Total Protein: 8 g/dL (ref 6.0–8.3)

## 2014-06-06 LAB — CBC WITH DIFFERENTIAL/PLATELET
BASOS ABS: 0 10*3/uL (ref 0.0–0.1)
BASOS PCT: 0 % (ref 0–1)
EOS ABS: 0.4 10*3/uL (ref 0.0–0.7)
Eosinophils Relative: 5 % (ref 0–5)
HCT: 43.8 % (ref 39.0–52.0)
Hemoglobin: 14.6 g/dL (ref 13.0–17.0)
LYMPHS ABS: 3.4 10*3/uL (ref 0.7–4.0)
LYMPHS PCT: 39 % (ref 12–46)
MCH: 29.7 pg (ref 26.0–34.0)
MCHC: 33.3 g/dL (ref 30.0–36.0)
MCV: 89 fL (ref 78.0–100.0)
Monocytes Absolute: 0.7 10*3/uL (ref 0.1–1.0)
Monocytes Relative: 8 % (ref 3–12)
NEUTROS PCT: 48 % (ref 43–77)
Neutro Abs: 4.1 10*3/uL (ref 1.7–7.7)
Platelets: 211 10*3/uL (ref 150–400)
RBC: 4.92 MIL/uL (ref 4.22–5.81)
RDW: 14.7 % (ref 11.5–15.5)
WBC: 8.7 10*3/uL (ref 4.0–10.5)

## 2014-06-06 LAB — POC OCCULT BLOOD, ED: Fecal Occult Bld: POSITIVE — AB

## 2014-06-06 LAB — SAMPLE TO BLOOD BANK

## 2014-06-06 NOTE — ED Provider Notes (Signed)
CSN: [id number]     Arrival date & time 06/06/14  2120 History   First MD Initiated Contact with Patient 06/06/14 2246     Chief Complaint  Patient presents with  . Rectal Bleeding     (Consider location/radiation/quality/duration/timing/severity/associated sxs/prior Treatment) Patient is a 49 y.o. male presenting with hematochezia. The history is provided by the patient.  Rectal Bleeding He noted onset this evening of bright red blood from the rectum. Bleeding was painless. Bleeding started while he was having sex but he denies any rectal trauma. He states that he has had problems with occasional rectal spotting for several years. He thinks that the problems hemorrhoid. He denies abdominal pain and nausea and vomiting. He denies pain but states he does sometimes have some rectal itching. Denies dizziness or lightheadedness. He is not on any aspirin or other anticoagulants.  Past Medical History  Diagnosis Date  . CHF (congestive heart failure)   . Hypertension    Past Surgical History  Procedure Laterality Date  . No past surgeries     Family History  Problem Relation Age of Onset  . Heart disease Maternal Grandfather   . Lung cancer Maternal Grandmother    History  Substance Use Topics  . Smoking status: Current Every Day Smoker -- 1.00 packs/day for 25 years    Types: Cigarettes  . Smokeless tobacco: Not on file  . Alcohol Use: Yes    Review of Systems  Gastrointestinal: Positive for hematochezia.  All other systems reviewed and are negative.     Allergies  Review of patient's allergies indicates no known allergies.  Home Medications   Prior to Admission medications   Medication Sig Start Date End Date Taking? Authorizing Provider  carvedilol (COREG) 12.5 MG tablet Take 1 tablet (12.5 mg total) by mouth 2 (two) times daily. 01/30/14   Rollene Rotunda, MD  lisinopril (PRINIVIL,ZESTRIL) 5 MG tablet Take 1 tablet (5 mg total) by mouth daily. 05/29/14   Rollene Rotunda,  MD   BP 119/78 mmHg  Pulse 106  Temp(Src) 98.2 F (36.8 C) (Oral)  Resp 16  Ht 5\' 9"  (1.753 m)  Wt 238 lb 3.2 oz (108.047 kg)  BMI 35.16 kg/m2  SpO2 97% Physical Exam  Nursing note and vitals reviewed.  49 year old male, resting comfortably and in no acute distress. Vital signs are significant for borderline tachycardia. Oxygen saturation is 97%, which is normal. Head is normocephalic and atraumatic. PERRLA, EOMI. Oropharynx is clear. Neck is nontender and supple without adenopathy or JVD. Back is nontender and there is no CVA tenderness. Lungs are clear without rales, wheezes, or rhonchi. Chest is nontender. Heart has regular rate and rhythm without murmur. Abdomen is soft, flat, nontender without masses or hepatosplenomegaly and peristalsis is normoactive. Rectal: No active bleeding currently. There is an anal fissure at the 6:00 position which is mildly tender. There are a few small hemorrhoids and it is not clear if this fissure actually was a hemorrhoid that had spontaneously ruptured. Digital exam shows normal sphincter tone, normal size prostate, no gross blood in the rectal vault. Extremities have no cyanosis or edema, full range of motion is present. Skin is warm and dry without rash. Neurologic: Mental status is normal, cranial nerves are intact, there are no motor or sensory deficits.  ED Course  Procedures (including critical care time) Labs Review Results for orders placed or performed during the hospital encounter of 06/06/14  CBC with Differential  Result Value Ref Range   WBC  8.7 4.0 - 10.5 K/uL   RBC 4.92 4.22 - 5.81 MIL/uL   Hemoglobin 14.6 13.0 - 17.0 g/dL   HCT 86.5 78.4 - 69.6 %   MCV 89.0 78.0 - 100.0 fL   MCH 29.7 26.0 - 34.0 pg   MCHC 33.3 30.0 - 36.0 g/dL   RDW 29.5 28.4 - 13.2 %   Platelets 211 150 - 400 K/uL   Neutrophils Relative % 48 43 - 77 %   Neutro Abs 4.1 1.7 - 7.7 K/uL   Lymphocytes Relative 39 12 - 46 %   Lymphs Abs 3.4 0.7 - 4.0 K/uL    Monocytes Relative 8 3 - 12 %   Monocytes Absolute 0.7 0.1 - 1.0 K/uL   Eosinophils Relative 5 0 - 5 %   Eosinophils Absolute 0.4 0.0 - 0.7 K/uL   Basophils Relative 0 0 - 1 %   Basophils Absolute 0.0 0.0 - 0.1 K/uL  Comprehensive metabolic panel  Result Value Ref Range   Sodium 139 135 - 145 mmol/L   Potassium 3.9 3.5 - 5.1 mmol/L   Chloride 106 96 - 112 mmol/L   CO2 25 19 - 32 mmol/L   Glucose, Bld 95 70 - 99 mg/dL   BUN 10 6 - 23 mg/dL   Creatinine, Ser 4.40 0.50 - 1.35 mg/dL   Calcium 9.2 8.4 - 10.2 mg/dL   Total Protein 8.0 6.0 - 8.3 g/dL   Albumin 3.8 3.5 - 5.2 g/dL   AST 22 0 - 37 U/L   ALT 24 0 - 53 U/L   Alkaline Phosphatase 84 39 - 117 U/L   Total Bilirubin 0.5 0.3 - 1.2 mg/dL   GFR calc non Af Amer >90 >90 mL/min   GFR calc Af Amer >90 >90 mL/min   Anion gap 8 5 - 15  POC occult blood, ED  Result Value Ref Range   Fecal Occult Bld POSITIVE (A) NEGATIVE  Sample to Blood Bank  Result Value Ref Range   Blood Bank Specimen SAMPLE AVAILABLE FOR TESTING    Sample Expiration 06/07/2014    MDM   Final diagnoses:  Rectal bleeding  Anal fissure  Hemorrhoids, unspecified hemorrhoid type    Rectal bleeding which appears to be from either a spontaneous rupture hemorrhoid or anal fissure. Orthostatic vital signs will be checked and, if normal, he will be referred to [facility] urology for follow-up.  The static vital signs were unremarkable. He is discharged with a prescription for insulin HC suppository and is referred to [facility] for follow-up. I anticipate he will need, at minimum, flexible sigmoidoscopy to makes her that there is not another source for his bleeding.  Dione Booze, MD 06/07/14 Jorje Guild

## 2014-06-06 NOTE — ED Notes (Signed)
Pt. reports rectal bleeding onset today , denies injury , no abdominal pain or diarrhea.

## 2014-06-06 NOTE — ED Notes (Signed)
The patient said him and his finacee were having sex and he started bleeding from his rectum.  He said it came out like "water" and even dropped on his feet.  He still has blood on his feet.

## 2014-06-07 MED ORDER — HYDROCORTISONE ACETATE 25 MG RE SUPP: 25.0000 mg | Freq: Two times a day (BID) | RECTAL | Status: DC

## 2014-06-07 NOTE — ED Notes (Signed)
Patient is alert and orientedx4.  Patient was explained discharge instructions and they understood them with no questions.  The patient's fiancee, Cody Carpenter is taking the patient home.

## 2014-06-07 NOTE — Discharge Instructions (Signed)
Anal Fissure, Adult An anal fissure is a small tear or crack in the skin around the anus. Bleeding from a fissure usually stops on its own within a few minutes. However, bleeding will often reoccur with each bowel movement until the crack heals.  CAUSES   Passing large, hard stools.  Frequent diarrheal stools.  Constipation.  Inflammatory bowel disease (Crohn's disease or ulcerative colitis).  Infections.  Anal sex. SYMPTOMS   Small amounts of blood seen on your stools, on toilet paper, or in the toilet after a bowel movement.  Rectal bleeding.  Painful bowel movements.  Itching or irritation around the anus. DIAGNOSIS Your caregiver will examine the anal area. An anal fissure can usually be seen with careful inspection. A rectal exam may be performed and a short tube (anoscope) may be used to examine the anal canal. TREATMENT   You may be instructed to take fiber supplements. These supplements can soften your stool to help make bowel movements easier.  Sitz baths may be recommended to help heal the tear. Do not use soap in the sitz baths.  A medicated cream or ointment may be prescribed to lessen discomfort. HOME CARE INSTRUCTIONS   Maintain a diet high in fruits, whole grains, and vegetables. Avoid constipating foods like bananas and dairy products.  Take sitz baths as directed by your caregiver.  Drink enough fluids to keep your urine clear or pale yellow.  Only take over-the-counter or prescription medicines for pain, discomfort, or fever as directed by your caregiver. Do not take aspirin as this may increase bleeding.  Do not use ointments containing numbing medications (anesthetics) or hydrocortisone. They could slow healing. SEEK MEDICAL CARE IF:   Your fissure is not completely healed within 3 days.  You have further bleeding.  You have a fever.  You have diarrhea mixed with blood.  You have pain.  Your problem is getting worse rather than  better. MAKE SURE YOU:   Understand these instructions.  Will watch your condition.  Will get help right away if you are not doing well or get worse. Document Released: 04/19/2005 Document Revised: 07/12/2011 Document Reviewed: 10/04/2010 [facility] Patient Information 2015 [place], [place]. This information is not intended to replace advice given to you by your health care provider. Make sure you discuss any questions you have with your health care provider.  Hemorrhoids Hemorrhoids are swollen veins around the rectum or anus. There are two types of hemorrhoids:   Internal hemorrhoids. These occur in the veins just inside the rectum. They may poke through to the outside and become irritated and painful.  External hemorrhoids. These occur in the veins outside the anus and can be felt as a painful swelling or hard lump near the anus. CAUSES  Pregnancy.   Obesity.   Constipation or diarrhea.   Straining to have a bowel movement.   Sitting for long periods on the toilet.  Heavy lifting or other activity that caused you to strain.  Anal intercourse. SYMPTOMS   Pain.   Anal itching or irritation.   Rectal bleeding.   Fecal leakage.   Anal swelling.   One or more lumps around the anus.  DIAGNOSIS  Your caregiver may be able to diagnose hemorrhoids by visual examination. Other examinations or tests that may be performed include:   Examination of the rectal area with a gloved hand (digital rectal exam).   Examination of anal canal using a small tube (scope).   A blood test if you have lost a  significant amount of blood.  A test to look inside the colon (sigmoidoscopy or colonoscopy). TREATMENT Most hemorrhoids can be treated at home. However, if symptoms do not seem to be getting better or if you have a lot of rectal bleeding, your caregiver may perform a procedure to help make the hemorrhoids get smaller or remove them completely. Possible treatments include:    Placing a rubber band at the base of the hemorrhoid to cut off the circulation (rubber band ligation).   Injecting a chemical to shrink the hemorrhoid (sclerotherapy).   Using a tool to burn the hemorrhoid (infrared light therapy).   Surgically removing the hemorrhoid (hemorrhoidectomy).   Stapling the hemorrhoid to block blood flow to the tissue (hemorrhoid stapling).  HOME CARE INSTRUCTIONS   Eat foods with fiber, such as whole grains, beans, nuts, fruits, and vegetables. Ask your doctor about taking products with added fiber in them (fibersupplements).  Increase fluid intake. Drink enough water and fluids to keep your urine clear or pale yellow.   Exercise regularly.   Go to the bathroom when you have the urge to have a bowel movement. Do not wait.   Avoid straining to have bowel movements.   Keep the anal area dry and clean. Use wet toilet paper or moist towelettes after a bowel movement.   Medicated creams and suppositories may be used or applied as directed.   Only take over-the-counter or prescription medicines as directed by your caregiver.   Take warm sitz baths for 15-20 minutes, 3-4 times a day to ease pain and discomfort.   Place ice packs on the hemorrhoids if they are tender and swollen. Using ice packs between sitz baths may be helpful.   Put ice in a plastic bag.   Place a towel between your skin and the bag.   Leave the ice on for 15-20 minutes, 3-4 times a day.   Do not use a donut-shaped pillow or sit on the toilet for long periods. This increases blood pooling and pain.  SEEK MEDICAL CARE IF:  You have increasing pain and swelling that is not controlled by treatment or medicine.  You have uncontrolled bleeding.  You have difficulty or you are unable to have a bowel movement.  You have pain or inflammation outside the area of the hemorrhoids. MAKE SURE YOU:  Understand these instructions.  Will watch your condition.  Will  get help right away if you are not doing well or get worse. Document Released: 04/16/2000 Document Revised: 04/05/2012 Document Reviewed: 02/22/2012 [facility] Patient Information 2015 [place], [place]. This information is not intended to replace advice given to you by your health care provider. Make sure you discuss any questions you have with your health care provider.  Hydrocortisone suppositories What is this medicine? HYDROCORTISONE (hye droe KOR ti sone) is a corticosteroid. It is used to decrease swelling, itching, and pain that is caused by minor skin irritations or by hemorrhoids. This medicine may be used for other purposes; ask your health care provider or pharmacist if you have questions. COMMON BRAND NAME(S): Anucort-HC, Anumed-HC, Anusol HC, Encort, GRx HiCort, Hemorrhoidal-HC, Hemril, Proctocort, Proctosert HC, Proctosol-HC, Rectacort HC, Rectasol-HC What should I tell my health care provider before I take this medicine? They need to know if you have any of these conditions: -an unusual or allergic reaction to hydrocortisone, corticosteroids, other medicines, foods, dyes, or preservatives -pregnant or trying to get pregnant -breast-feeding How should I use this medicine? This medicine is for rectal use only. Do  not take by mouth. Wash your hands before and after use. Take off the foil wrapping. Wet the tip of the suppository with cold tap water to make it easier to use. Lie on your side with your lower leg straightened out and your upper leg bent forward toward your stomach. Lift upper buttock to expose the rectal area. Apply gentle pressure to insert the suppository completely into the rectum, pointed end first. Hold buttocks together for a few seconds. Remain lying down for about 15 minutes to avoid having the suppository come out. Do not use more often than directed. Talk to your pediatrician regarding the use of this medicine in children. Special care may be needed. Overdosage: If  you think you have taken too much of this medicine contact a poison control center or emergency room at once. NOTE: This medicine is only for you. Do not share this medicine with others. What if I miss a dose? If you miss a dose, use it as soon as you can. If it is almost time for your next dose, use only that dose. Do not use double or extra doses. What may interact with this medicine? Interactions are not expected. Do not use any other rectal products on the affected area without telling your doctor or health care professional. This list may not describe all possible interactions. Give your health care provider a list of all the medicines, herbs, non-prescription drugs, or dietary supplements you use. Also tell them if you smoke, drink alcohol, or use illegal drugs. Some items may interact with your medicine. What should I watch for while using this medicine? Visit your doctor or health care professional for regular checks on your progress. Tell your doctor or health care professional if your symptoms do not improve after a few days of use. Do not use if there is blood in your stools. If you get any type of infection while using this medicine, you may need to stop using this medicine until our infections clears up. Ask your doctor or health care professional for advice. What side effects may I notice from receiving this medicine? Side effects that you should report to your doctor or health care professional as soon as possible: -bloody or black, tarry stools -painful, red, pus filled blisters in hair follicles -rectal pain, burning or bleeding after use of medicine Side effects that usually do not require medical attention (report to your doctor or health care professional if they continue or are bothersome): -changes in skin color -dry skin -itching or irritation This list may not describe all possible side effects. Call your doctor for medical advice about side effects. You may report side  effects to FDA at 1-800-FDA-1088. Where should I keep my medicine? Keep out of the reach of children. Store at room temperature between 20 and 25 degrees C (68 and 77 degrees F). Protect from heat and freezing. Throw away any unused medicine after the expiration date. NOTE: This sheet is a summary. It may not cover all possible information. If you have questions about this medicine, talk to your doctor, pharmacist, or health care provider.  2015, [employer]. (2007-09-01 16:07:24)

## 2014-10-28 ENCOUNTER — Encounter: Payer: Self-pay | Admitting: Pulmonary Disease

## 2014-11-08 ENCOUNTER — Ambulatory Visit: Payer: Self-pay | Admitting: Pulmonary Disease

## 2015-02-03 ENCOUNTER — Other Ambulatory Visit: Payer: Self-pay | Admitting: Cardiology

## 2015-02-03 NOTE — Telephone Encounter (Signed)
°  1. Which medications need to be refilled? Carvedilol and Lisinopril   2. Which pharmacy is medication to be sent to? [facility]   3. Do they need a 30 day or 90 day supply? 90  4. Would they like a call back once the medication has been sent to the pharmacy? Yes

## 2015-02-03 NOTE — Telephone Encounter (Signed)
LMTCB - patient needs OV with Dr. Antoine Poche

## 2015-02-04 MED ORDER — LISINOPRIL 5 MG PO TABS: 5.0000 mg | ORAL_TABLET | Freq: Every day | ORAL | Status: DC

## 2015-02-04 MED ORDER — CARVEDILOL 12.5 MG PO TABS: 12.5000 mg | ORAL_TABLET | Freq: Two times a day (BID) | ORAL | Status: DC

## 2015-02-04 NOTE — Telephone Encounter (Signed)
LM that meds will be refilled for 30 day supply as patient needs appointment and that he should call our office to arrange this.  Rx(s) sent to pharmacy electronically.

## 2015-03-04 ENCOUNTER — Ambulatory Visit: Payer: Self-pay | Admitting: Cardiology

## 2015-03-14 ENCOUNTER — Encounter: Payer: Self-pay | Admitting: Cardiology

## 2015-03-14 ENCOUNTER — Ambulatory Visit (INDEPENDENT_AMBULATORY_CARE_PROVIDER_SITE_OTHER): Payer: Self-pay | Admitting: Cardiology

## 2015-03-14 VITALS — BP 126/72 | HR 86 | Ht 67.0 in | Wt 239.0 lb

## 2015-03-14 DIAGNOSIS — I429 Cardiomyopathy, unspecified: Secondary | ICD-10-CM

## 2015-03-14 MED ORDER — LISINOPRIL 5 MG PO TABS
5.0000 mg | ORAL_TABLET | Freq: Every day | ORAL | Status: DC
Start: 2015-03-14 — End: 2016-04-27

## 2015-03-14 MED ORDER — CARVEDILOL 12.5 MG PO TABS: 12.5000 mg | ORAL_TABLET | Freq: Two times a day (BID) | ORAL | Status: DC

## 2015-03-14 NOTE — Progress Notes (Signed)
   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  His EF had been 20% in 2008 but improved with medical management. Unfortunately when he came back to see [place] most recently he was off of all of his medications. A followup echo demonstrated his EF to be back down from 55% to about 25-30%.   He has had meds titrated.  He feels well.  The patient denies any new symptoms such as chest discomfort, neck or arm discomfort. There has been no new shortness of breath, PND or orthopnea. There have been no reported palpitations, presyncope or syncope.    No Known Allergies  Current Outpatient Prescriptions  Medication Sig Dispense Refill  . carvedilol (COREG) 12.5 MG tablet Take 1 tablet (12.5 mg total) by mouth 2 (two) times daily with a meal. <PLEASE MAKE APPOINTMENT FOR REFILLS> 60 tablet 0  . lisinopril (PRINIVIL,ZESTRIL) 5 MG tablet Take 1 tablet (5 mg total) by mouth daily. <PLEASE MAKE APPOINTMENT FOR REFILLS> 30 tablet 0   No current facility-administered medications for this visit.    Past Medical History  Diagnosis Date  . CHF (congestive heart failure) (HCC)   . Hypertension     Past Surgical History  Procedure Laterality Date  . No past surgeries      ROS:  As stated in the HPI and negative for all other systems.  PHYSICAL EXAM BP 126/72 mmHg  Pulse 86  Ht 5\' 7"  (1.702 m)  Wt 239 lb (108.41 kg)  BMI 37.42 kg/m2 GENERAL:  Well appearing NECK:  No jugular venous distention, waveform within normal limits, carotid upstroke brisk and symmetric, no bruits, no thyromegaly LUNGS:  Clear to auscultation bilaterally HEART:  PMI not displaced or sustained,S1 and S2 within normal limits, no S3, no S4, no clicks, no rubs, no murmurs ABD:  Flat, positive bowel sounds normal in frequency in pitch, no bruits, no rebound, no guarding, no midline pulsatile mass, no hepatomegaly, no splenomegaly EXT:  2 plus pulses throughout, no edema, no cyanosis no  clubbing   EKG:  Sinus rhythm, rate 86, nonspecific inferior and lateral T-wave flattening and lateral person. Left axis deviation, interventricular conduction delay, no change this.   03/14/2015  ASSESSMENT AND PLAN  HTN:  This is being managed in the context of treating his CHF  CHF:  He seems to be euvolemic.is having no symptoms.  I will check an echocardiogram. He will otherwise continue the meds as listed.  TOBACCO ABUSE:  We have educated him and he is again encouraged to quit  SLEEP APNEA:  He continues with his CPAP.

## 2015-03-14 NOTE — Patient Instructions (Signed)

## 2015-03-17 ENCOUNTER — Encounter: Payer: Self-pay | Admitting: Cardiology

## 2015-03-21 ENCOUNTER — Other Ambulatory Visit (HOSPITAL_COMMUNITY): Payer: Self-pay

## 2015-03-26 NOTE — Addendum Note (Signed)
Addended by: Ronnell Guadalajara A on: 03/26/2015 08:26 AM   Modules accepted: Orders

## 2015-04-01 ENCOUNTER — Ambulatory Visit (HOSPITAL_COMMUNITY): Payer: Self-pay | Attending: Cardiovascular Disease

## 2015-04-01 ENCOUNTER — Other Ambulatory Visit: Payer: Self-pay

## 2015-04-01 DIAGNOSIS — F17200 Nicotine dependence, unspecified, uncomplicated: Secondary | ICD-10-CM | POA: Insufficient documentation

## 2015-04-01 DIAGNOSIS — I517 Cardiomegaly: Secondary | ICD-10-CM | POA: Insufficient documentation

## 2015-04-01 DIAGNOSIS — I429 Cardiomyopathy, unspecified: Secondary | ICD-10-CM

## 2015-04-01 DIAGNOSIS — I10 Essential (primary) hypertension: Secondary | ICD-10-CM | POA: Insufficient documentation

## 2015-04-01 DIAGNOSIS — I371 Nonrheumatic pulmonary valve insufficiency: Secondary | ICD-10-CM | POA: Insufficient documentation

## 2015-04-25 ENCOUNTER — Telehealth: Payer: Self-pay | Admitting: *Deleted

## 2015-04-25 DIAGNOSIS — Z79899 Other long term (current) drug therapy: Secondary | ICD-10-CM

## 2015-04-25 MED ORDER — SPIRONOLACTONE 25 MG PO TABS: 12.5000 mg | ORAL_TABLET | Freq: Every day | ORAL | Status: DC

## 2015-04-25 NOTE — Telephone Encounter (Signed)
-----   Message from Rollene Rotunda, MD sent at 04/12/2015  1:09 AM EST ----- His EF remains low.  I would like to add a low dose of spironolactone 12.5 mg po daily disp number 90 with 3 refills.  I will follow guidelines for spironolactone follow up in heart failure.  (Check potassium levels and  renal function 3--4 days and 1 week, then at least monthly for first 3 months and every 3 months thereafter after initiation of spironolactone.)  Call Mr. Akens with the results

## 2015-04-25 NOTE — Telephone Encounter (Signed)
Spoke with pt about his blood work, BMP was order for pt to have done  And spironolactone was send into pt pharmacy

## 2015-05-08 MED FILL — LISINOPRIL 5 MG TABLET: 5 | 30 days supply | Qty: 30 | Fill #1

## 2015-05-28 MED FILL — CARVEDILOL 12.5 MG TABLET: 12.5 | 30 days supply | Qty: 60 | Fill #2

## 2015-06-09 MED FILL — LISINOPRIL 5 MG TABLET: 5 | 30 days supply | Qty: 30 | Fill #2

## 2015-07-03 MED FILL — CARVEDILOL 12.5 MG TABLET: 12.5 | 30 days supply | Qty: 60 | Fill #3

## 2015-07-11 MED FILL — SPIRONOLACTONE 25 MG TABLET: 25 | 90 days supply | Qty: 45 | Fill #1

## 2015-07-11 MED FILL — LISINOPRIL 5 MG TABLET: 5 | 30 days supply | Qty: 30 | Fill #3

## 2015-07-14 ENCOUNTER — Telehealth: Payer: Self-pay | Admitting: *Deleted

## 2015-07-14 DIAGNOSIS — Z79899 Other long term (current) drug therapy: Secondary | ICD-10-CM

## 2015-07-14 NOTE — Telephone Encounter (Signed)
BMP labs mailed to patient

## 2015-08-11 MED FILL — CARVEDILOL 12.5 MG TABLET: 12.5 | 30 days supply | Qty: 60 | Fill #4

## 2015-08-11 MED FILL — LISINOPRIL 5 MG TABLET: 5 | 30 days supply | Qty: 30 | Fill #4

## 2015-09-15 MED FILL — LISINOPRIL 5 MG TABLET: 5 | 30 days supply | Qty: 30 | Fill #5

## 2015-09-15 MED FILL — CARVEDILOL 12.5 MG TABLET: 12.5 | 30 days supply | Qty: 60 | Fill #5

## 2015-09-25 LAB — BASIC METABOLIC PANEL
BUN: 18 mg/dL (ref 7–25)
CALCIUM: 8.8 mg/dL (ref 8.6–10.3)
CO2: 20 mmol/L (ref 20–31)
Chloride: 106 mmol/L (ref 98–110)
Creat: 0.97 mg/dL (ref 0.60–1.35)
GLUCOSE: 106 mg/dL — AB (ref 65–99)
POTASSIUM: 4.7 mmol/L (ref 3.5–5.3)
Sodium: 136 mmol/L (ref 135–146)

## 2015-10-17 MED FILL — SPIRONOLACTONE 25 MG TABLET: 25 | 90 days supply | Qty: 45 | Fill #2

## 2015-10-17 MED FILL — LISINOPRIL 5 MG TABLET: 5 | 90 days supply | Qty: 90 | Fill #6

## 2015-10-30 MED FILL — CARVEDILOL 12.5 MG TABLET: 12.5 | 30 days supply | Qty: 60 | Fill #6

## 2015-12-05 MED FILL — CARVEDILOL 12.5 MG TABLET: 12.5 | 30 days supply | Qty: 60 | Fill #7

## 2016-01-16 MED FILL — CARVEDILOL 12.5 MG TABLET: 12.5 | 30 days supply | Qty: 60 | Fill #8

## 2016-01-20 MED FILL — LISINOPRIL 5 MG TABLET: 5 | 90 days supply | Qty: 90 | Fill #7

## 2016-01-20 MED FILL — SPIRONOLACTONE 25 MG TABLET: 25 | 90 days supply | Qty: 45 | Fill #3

## 2016-02-25 MED FILL — CARVEDILOL 12.5 MG TABLET: 12.5 | 30 days supply | Qty: 60 | Fill #9

## 2016-04-05 ENCOUNTER — Other Ambulatory Visit: Payer: Self-pay | Admitting: Cardiology

## 2016-04-05 MED FILL — CARVEDILOL 12.5 MG TABLET: 12.5 | 90 days supply | Qty: 180 | Fill #0

## 2016-04-27 ENCOUNTER — Other Ambulatory Visit: Payer: Self-pay | Admitting: Cardiology

## 2016-04-27 MED FILL — SPIRONOLACTONE 25 MG TABLET: 25 | 180 days supply | Qty: 90 | Fill #0

## 2016-04-27 MED FILL — LISINOPRIL 5 MG TABLET: 5 | 90 days supply | Qty: 90 | Fill #0

## 2016-04-27 NOTE — Telephone Encounter (Signed)
REFILL 

## 2016-05-23 NOTE — Progress Notes (Deleted)
   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  His EF had been 20% in 2008 but improved with medical management. Unfortunately when he came back to see [place] most recently he was off of all of his medications. A followup echo demonstrated his EF to be back down from 55% to about 25-30%.   He has had meds titrated.  However, a repeat echo in Nov 2016 demonstrated his EF was still about 30%.  He has not been back since that echo.  ***    He feels well.  The patient denies any new symptoms such as chest discomfort, neck or arm discomfort. There has been no new shortness of breath, PND or orthopnea. There have been no reported palpitations, presyncope or syncope.    No Known Allergies  Current Outpatient Prescriptions  Medication Sig Dispense Refill  . carvedilol (COREG) 12.5 MG tablet TAKE 1 TABLET BY MOUTH 2 TIMES DAILY WITH A MEAL. 180 tablet PRN  . lisinopril (PRINIVIL,ZESTRIL) 5 MG tablet Take 1 tablet (5 mg total) by mouth daily. NEED OV. 90 tablet 0  . spironolactone (ALDACTONE) 25 MG tablet Take 0.5 tablets (12.5 mg total) by mouth daily. NEED OV. 90 tablet 0   No current facility-administered medications for this visit.     Past Medical History:  Diagnosis Date  . CHF (congestive heart failure) (HCC)   . Hypertension   . Sleep apnea    CPAP    Past Surgical History:  Procedure Laterality Date  . NO PAST SURGERIES      ROS:  *** As stated in the HPI and negative for all other systems.  PHYSICAL EXAM There were no vitals taken for this visit. GENERAL:  Well appearing NECK:  No jugular venous distention, waveform within normal limits, carotid upstroke brisk and symmetric, no bruits, no thyromegaly LUNGS:  Clear to auscultation bilaterally HEART:  PMI not displaced or sustained,S1 and S2 within normal limits, no S3, no S4, no clicks, no rubs, no murmurs ABD:  Flat, positive bowel sounds normal in frequency in pitch, no bruits, no  rebound, no guarding, no midline pulsatile mass, no hepatomegaly, no splenomegaly EXT:  2 plus pulses throughout, no edema, no cyanosis no clubbing   EKG:  Sinus rhythm, rate ***, nonspecific inferior and lateral T-wave flattening and lateral person. Left axis deviation, interventricular conduction delay, no change this.   05/23/2016   ASSESSMENT AND PLAN  HTN:  This is being managed in the context of treating his CHF  CHF:  He seems to be euvolemic.is having no symptoms.  I will check an echocardiogram. He will otherwise continue the meds as listed.  TOBACCO ABUSE:  We have educated him and he is again encouraged to quit  SLEEP APNEA:  He continues with his CPAP.

## 2016-05-25 ENCOUNTER — Ambulatory Visit: Payer: Self-pay | Admitting: Cardiology

## 2016-06-15 ENCOUNTER — Ambulatory Visit: Payer: Self-pay | Admitting: Cardiology

## 2016-06-20 NOTE — Progress Notes (Signed)
   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  His EF had been 20% in 2008 but improved with medical management. Unfortunately when he came back to see [place] most recently he was off of all of his medications. A followup echo demonstrated his EF to be back down from 55% to about 25-30%.   He has had meds titrated.  However, a repeat echo in Nov 2016 demonstrated his EF was still about 30%.  He has not been back since that echo.  He reports that he feels well.  The patient denies any new symptoms such as chest discomfort, neck or arm discomfort. There has been no new shortness of breath, PND or orthopnea. There have been no reported palpitations, presyncope or syncope.  He has gained weight because "I ate too much through the holidays."     No Known Allergies  Current Outpatient Prescriptions  Medication Sig Dispense Refill  . carvedilol (COREG) 12.5 MG tablet TAKE 1 TABLET BY MOUTH 2 TIMES DAILY WITH A MEAL. 180 tablet PRN  . lisinopril (PRINIVIL,ZESTRIL) 5 MG tablet Take 1 tablet (5 mg total) by mouth daily. NEED OV. 90 tablet 0  . spironolactone (ALDACTONE) 25 MG tablet Take 0.5 tablets (12.5 mg total) by mouth daily. NEED OV. 90 tablet 0  . carvedilol (COREG) 3.125 MG tablet Take 1 tablet (3.125 mg total) by mouth 2 (two) times daily. To take with 12.5 mg twice a day 180 tablet 3   No current facility-administered medications for this visit.     Past Medical History:  Diagnosis Date  . CHF (congestive heart failure) (HCC)   . Hypertension   . Sleep apnea    CPAP    Past Surgical History:  Procedure Laterality Date  . NO PAST SURGERIES      ROS:   As stated in the HPI and negative for all other systems.  PHYSICAL EXAM BP 116/70   Pulse 90   Ht 5\' 7"  (1.702 m)   Wt 253 lb 6.4 oz (114.9 kg)   BMI 39.69 kg/m  GENERAL:  Well appearing NECK:  No jugular venous distention, waveform within normal limits, carotid upstroke brisk and  symmetric, no bruits, no thyromegaly LUNGS:  Clear to auscultation bilaterally HEART:  PMI not displaced or sustained,S1 and S2 within normal limits, no S3, no S4, no clicks, no rubs, no murmurs ABD:  Flat, positive bowel sounds normal in frequency in pitch, no bruits, no rebound, no guarding, no midline pulsatile mass, no hepatomegaly, no splenomegaly EXT:  2 plus pulses throughout, no edema, no cyanosis no clubbing   EKG:  Sinus rhythm, rate 90, nonspecific inferior and lateral T-wave flattening and lateral person. Left axis deviation, interventricular conduction delay, no change this.   06/23/2016   ASSESSMENT AND PLAN  HTN:  This is being managed in the context of treating his CHF  CHF:  He seems to be euvolemic is having no symptoms.  He hasn't tolerated higher dose beta blockers in the past but I would like to try to creep back up on his carvedilol by adding 3.125 twice daily. I've had to reduce this in the past.  TOBACCO ABUSE:  We have educated him and he is again encouraged to quit.  He has reduced this.    SLEEP APNEA:  He continues with his CPAP.

## 2016-06-21 ENCOUNTER — Encounter: Payer: Self-pay | Admitting: Cardiology

## 2016-06-21 ENCOUNTER — Ambulatory Visit (INDEPENDENT_AMBULATORY_CARE_PROVIDER_SITE_OTHER): Payer: Self-pay | Admitting: Cardiology

## 2016-06-21 VITALS — BP 116/70 | HR 90 | Ht 67.0 in | Wt 253.4 lb

## 2016-06-21 DIAGNOSIS — I10 Essential (primary) hypertension: Secondary | ICD-10-CM

## 2016-06-21 DIAGNOSIS — I420 Dilated cardiomyopathy: Secondary | ICD-10-CM

## 2016-06-21 MED ORDER — CARVEDILOL 3.125 MG PO TABS: 3.1250 mg | ORAL_TABLET | Freq: Two times a day (BID) | ORAL | 3 refills | Status: DC

## 2016-06-21 NOTE — Patient Instructions (Signed)
Medication Instructions:  START-Coreg 3.125 mg to take with your 12.5 mg twice a day  Labwork: None Ordered  Testing/Procedures: None Ordered  Follow-Up: Your physician recommends that you schedule a follow-up appointment in: 4 Months.   Any Other Special Instructions Will Be Listed Below (If Applicable).   If you need a refill on your cardiac medications before your next appointment, please call your pharmacy.

## 2016-06-22 MED FILL — CARVEDILOL 3.125 MG TABLET: 3.125 | 90 days supply | Qty: 180 | Fill #0

## 2016-06-23 ENCOUNTER — Encounter: Payer: Self-pay | Admitting: Cardiology

## 2016-07-26 ENCOUNTER — Other Ambulatory Visit: Payer: Self-pay | Admitting: Cardiology

## 2016-07-26 MED FILL — LISINOPRIL 5 MG TABLET: 5 | 90 days supply | Qty: 90 | Fill #0

## 2016-07-26 MED FILL — CARVEDILOL 12.5 MG TABLET: 12.5 | 90 days supply | Qty: 180 | Fill #1

## 2016-10-18 ENCOUNTER — Other Ambulatory Visit: Payer: Self-pay | Admitting: Cardiology

## 2016-10-18 MED FILL — CARVEDILOL 3.125 MG TABLET: 3.125 | 90 days supply | Qty: 180 | Fill #1

## 2016-10-18 MED FILL — LISINOPRIL 5 MG TABLET: 5 | 90 days supply | Qty: 90 | Fill #1

## 2016-10-18 MED FILL — SPIRONOLACTONE 25 MG TABLET: 25 | 90 days supply | Qty: 45 | Fill #0

## 2016-11-22 MED FILL — CARVEDILOL 12.5 MG TABLET: 12.5 | 90 days supply | Qty: 180 | Fill #2

## 2017-01-13 MED FILL — LISINOPRIL 5 MG TABLET: 5 | 90 days supply | Qty: 90 | Fill #2

## 2017-01-13 MED FILL — SPIRONOLACTONE 25 MG TABLET: 25 | 90 days supply | Qty: 45 | Fill #1

## 2017-02-14 MED FILL — CARVEDILOL 3.125 MG TABLET: 3.125 | 90 days supply | Qty: 180 | Fill #2

## 2017-03-22 MED FILL — CARVEDILOL 12.5 MG TABLET: 12.5 | 90 days supply | Qty: 180 | Fill #3

## 2017-04-20 MED FILL — SPIRONOLACTONE 25 MG TABLET: 25 | 90 days supply | Qty: 45 | Fill #2

## 2017-04-20 MED FILL — LISINOPRIL 5 MG TABLET: 5 | 90 days supply | Qty: 90 | Fill #3

## 2017-06-13 MED FILL — CARVEDILOL 3.125 MG TABLET: 3.125 | 90 days supply | Qty: 180 | Fill #3

## 2017-07-08 ENCOUNTER — Other Ambulatory Visit: Payer: Self-pay | Admitting: Cardiology

## 2017-07-08 MED FILL — SPIRONOLACTONE 25 MG TABLET: 25 | 90 days supply | Qty: 45 | Fill #3

## 2017-07-08 MED FILL — CARVEDILOL 12.5 MG TABLET: 12.5 | 90 days supply | Qty: 180 | Fill #0

## 2017-08-01 ENCOUNTER — Other Ambulatory Visit: Payer: Self-pay | Admitting: Cardiology

## 2017-08-01 MED FILL — LISINOPRIL 5 MG TABLET: 5 | 90 days supply | Qty: 90 | Fill #0

## 2017-08-01 NOTE — Telephone Encounter (Signed)
REFILL 

## 2017-09-28 ENCOUNTER — Other Ambulatory Visit: Payer: Self-pay | Admitting: Cardiology

## 2017-09-28 MED FILL — CARVEDILOL 3.125 MG TABLET: 3.125 | 60 days supply | Qty: 120 | Fill #0

## 2017-09-28 NOTE — Telephone Encounter (Signed)
Rx sent to pharmacy   

## 2017-10-17 MED FILL — SPIRONOLACTONE 25 MG TABLET: 25 | 90 days supply | Qty: 45 | Fill #4

## 2017-10-26 ENCOUNTER — Other Ambulatory Visit: Payer: Self-pay | Admitting: Cardiology

## 2017-10-26 MED FILL — CARVEDILOL 12.5 MG TABLET: 12.5 | 90 days supply | Qty: 180 | Fill #1

## 2017-10-27 MED FILL — LISINOPRIL 5 MG TABLET: 5 | 90 days supply | Qty: 90 | Fill #0

## 2017-12-05 ENCOUNTER — Other Ambulatory Visit: Payer: Self-pay | Admitting: Cardiology

## 2017-12-05 MED FILL — CARVEDILOL 3.125 MG TABLET: 3.125 | 60 days supply | Qty: 120 | Fill #0

## 2017-12-22 NOTE — Progress Notes (Signed)
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  His EF had been 20% in 2008 but improved with medical management. Unfortunately he eventually stopped his meds and his EF went from a high of 55% to 25% in 2015 and after med titration only went up to 30% in 2016.  I did try to titrate meds when I last saw him but he has not tolerated med titration and I have tried to move slowly but he has not been back to see me since  Feb of last year.  He returns for follow up.   He realizes that he has not been back for follow-up but he does say that he is been compliant with his medications at this time.  He is been watching his salt a little bit better.  He still works as a Theme park manager.  Unfortunately he has been getting short of breath when he bends over.  He gets a little panicked when this happens.  He feels a little lightheaded when he stands back up.  He is denying any PND or orthopnea.  Is not having any palpitations, presyncope or syncope.  Chest pressure, neck or arm discomfort.  He reports no swelling.   No Known Allergies  Current Outpatient Medications  Medication Sig Dispense Refill  . carvedilol (COREG) 12.5 MG tablet TAKE 1 TABLET BY MOUTH 2 TIMES DAILY WITH A MEAL. 180 tablet PRN  . carvedilol (COREG) 3.125 MG tablet TAKE 1 TABLET BY MOUTH TWICE A DAY. TO TAKE WITH 12.5 MG TABLETS 120 tablet 0  . lisinopril (PRINIVIL,ZESTRIL) 5 MG tablet Take 1 tablet (5 mg total) by mouth 2 (two) times daily. 180 tablet 3  . spironolactone (ALDACTONE) 25 MG tablet Take 0.5 tablets (12.5 mg total) by mouth daily. Please call to schedule follow up apointment, thanks! 90 tablet 3   No current facility-administered medications for this visit.     Past Medical History:  Diagnosis Date  . CHF (congestive heart failure) ([place])   . Hypertension   . Sleep apnea    CPAP    Past Surgical History:  Procedure Laterality Date  . NO PAST SURGERIES      ROS:   As  stated in the HPI and negative for all other systems.  PHYSICAL EXAM BP 120/74 (BP Location: Left Arm, Patient Position: Sitting, Cuff Size: Normal)   Pulse 81   Ht _0  (1.702 m)   Wt 248 lb (112.5 kg)   BMI 38.84 kg/m  GENERAL:  Well appearing NECK:  No jugular venous distention, waveform within normal limits, carotid upstroke brisk and symmetric, no bruits, no thyromegaly LUNGS:  Clear to auscultation bilaterally CHEST:  Unremarkable HEART:  PMI not displaced or sustained,S1 and S2 within normal limits, no S3, no S4, no clicks, no rubs, no murmurs ABD:  Flat, positive bowel sounds normal in frequency in pitch, no bruits, no rebound, no guarding, no midline pulsatile mass, no hepatomegaly, no splenomegaly EXT:  2 plus pulses throughout, no edema, no cyanosis no clubbing    EKG:  Sinus rhythm, rate 81, nonspecific inferior and lateral T-wave flattening and lateral person. 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  We talked about making sure he breathes when he  bends over instead of holding his breath.  I am to check a be met in an echocardiogram.  Try to increase his lisinopril to 5 mg twice daily.  He has not tolerated much in the way of med titration previously so I will move slowly.  We talked about low salt to include avoiding processed meats and bread.  He will work harder on this.  We also talked about weight loss.   TOBACCO ABUSE:  He has been encouraged to stop smoking.    SLEEP APNEA:   He continue with CPAP.

## 2017-12-23 ENCOUNTER — Ambulatory Visit (INDEPENDENT_AMBULATORY_CARE_PROVIDER_SITE_OTHER): Payer: Self-pay | Admitting: Cardiology

## 2017-12-23 ENCOUNTER — Encounter: Payer: Self-pay | Admitting: Cardiology

## 2017-12-23 VITALS — BP 120/74 | HR 81 | Ht 67.0 in | Wt 248.0 lb

## 2017-12-23 DIAGNOSIS — Z79899 Other long term (current) drug therapy: Secondary | ICD-10-CM

## 2017-12-23 DIAGNOSIS — R0602 Shortness of breath: Secondary | ICD-10-CM

## 2017-12-23 DIAGNOSIS — I429 Cardiomyopathy, unspecified: Secondary | ICD-10-CM

## 2017-12-23 MED ORDER — LISINOPRIL 5 MG PO TABS: 5.0000 mg | ORAL_TABLET | Freq: Two times a day (BID) | ORAL | 3 refills | Status: DC

## 2017-12-23 MED FILL — LISINOPRIL 5 MG TABLET: 5 | 90 days supply | Qty: 180 | Fill #0

## 2017-12-23 NOTE — Patient Instructions (Addendum)
Medication Instructions:  INCREASE- Lisinopril 5 mg twice a day  If you need a refill on your cardiac medications before your next appointment, please call your pharmacy.  Labwork: BNP and BMP HERE IN OUR OFFICE AT [facility]  Take the provided lab slips with you to the lab for your blood draw.   You will NOT need to fast    Testing/Procedures: Your physician has requested that you have an echocardiogram. Echocardiography is a painless test that uses sound waves to create images of your heart. It provides your doctor with information about the size and shape of your heart and how well your heart's chambers and valves are working. This procedure takes approximately one hour. There are no restrictions for this procedure.   Follow-Up: Your physician wants you to follow-up in: 3 Months.     Thank you for choosing [facility] at [facility]!!

## 2017-12-28 ENCOUNTER — Ambulatory Visit (HOSPITAL_COMMUNITY): Payer: Self-pay | Attending: Cardiology

## 2017-12-28 ENCOUNTER — Other Ambulatory Visit: Payer: Self-pay

## 2017-12-28 DIAGNOSIS — I509 Heart failure, unspecified: Secondary | ICD-10-CM | POA: Insufficient documentation

## 2017-12-28 DIAGNOSIS — I110 Hypertensive heart disease with heart failure: Secondary | ICD-10-CM | POA: Insufficient documentation

## 2017-12-28 DIAGNOSIS — G4730 Sleep apnea, unspecified: Secondary | ICD-10-CM | POA: Insufficient documentation

## 2017-12-28 DIAGNOSIS — I429 Cardiomyopathy, unspecified: Secondary | ICD-10-CM | POA: Insufficient documentation

## 2017-12-28 DIAGNOSIS — Z720 Tobacco use: Secondary | ICD-10-CM | POA: Insufficient documentation

## 2018-01-03 LAB — BASIC METABOLIC PANEL
BUN / CREAT RATIO: 15 (ref 9–20)
BUN: 21 mg/dL (ref 6–24)
CO2: 19 mmol/L — ABNORMAL LOW (ref 20–29)
Calcium: 9.5 mg/dL (ref 8.7–10.2)
Chloride: 105 mmol/L (ref 96–106)
Creatinine, Ser: 1.41 mg/dL — ABNORMAL HIGH (ref 0.76–1.27)
GFR calc Af Amer: 67 mL/min/{1.73_m2} (ref >59)
GFR, EST NON AFRICAN AMERICAN: 58 mL/min/{1.73_m2} — AB (ref >59)
GLUCOSE: 101 mg/dL — AB (ref 65–99)
POTASSIUM: 4.5 mmol/L (ref 3.5–5.2)
SODIUM: 141 mmol/L (ref 134–144)

## 2018-01-03 LAB — BRAIN NATRIURETIC PEPTIDE: BNP: 97.8 pg/mL (ref 0.0–100.0)

## 2018-01-04 ENCOUNTER — Telehealth: Payer: Self-pay | Admitting: *Deleted

## 2018-01-04 DIAGNOSIS — I519 Heart disease, unspecified: Secondary | ICD-10-CM

## 2018-01-04 NOTE — Telephone Encounter (Signed)
CPX ordered and send to scheduling to be schedule Referral for HF placed in Epic

## 2018-01-04 NOTE — Telephone Encounter (Signed)
-----   Message from Rollene Rotunda, MD sent at 12/28/2017  8:12 PM EDT ----- His EF is very low and I have been unable to titrate meds.  I would like to send him for a cardiopulmonary stress test.  He might need to be sent to the HF clinic for advance therapies.

## 2018-01-11 ENCOUNTER — Other Ambulatory Visit: Payer: Self-pay | Admitting: Cardiology

## 2018-01-11 MED FILL — SPIRONOLACTONE 25 MG TABLET: 25 | 90 days supply | Qty: 45 | Fill #0

## 2018-01-11 NOTE — Telephone Encounter (Signed)
Rx request sent to pharmacy.  

## 2018-01-19 ENCOUNTER — Ambulatory Visit (HOSPITAL_COMMUNITY): Payer: Self-pay | Attending: Cardiology

## 2018-01-19 DIAGNOSIS — R0600 Dyspnea, unspecified: Secondary | ICD-10-CM

## 2018-01-19 DIAGNOSIS — I519 Heart disease, unspecified: Secondary | ICD-10-CM | POA: Insufficient documentation

## 2018-01-27 ENCOUNTER — Telehealth: Payer: Self-pay | Admitting: Cardiology

## 2018-01-27 NOTE — Telephone Encounter (Signed)
New Message ° ° °Patient is returning call in reference to test results. Please call.  °

## 2018-01-27 NOTE — Telephone Encounter (Signed)
Returned call to patient CPX results given.Appointment scheduled with Dr.Hochrein 02/10/18 at 9:20 am to discuss.

## 2018-01-31 MED FILL — CARVEDILOL 12.5 MG TABLET: 12.5 | 90 days supply | Qty: 180 | Fill #2

## 2018-02-09 ENCOUNTER — Other Ambulatory Visit: Payer: Self-pay | Admitting: Cardiology

## 2018-02-09 MED FILL — CARVEDILOL 3.125 MG TABLET: 3.125 | 60 days supply | Qty: 120 | Fill #0

## 2018-02-09 NOTE — Progress Notes (Signed)
     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  His EF had been 20% in 2008 but improved with medical management. Unfortunately he eventually stopped his meds and his EF went from a high of 55% to 25% in 2015 and after med titration only went up to 30% in 2016.  However, in August his EF was 20 - 25%.   I have had a difficult time titrating his meds because of hypotension.  I did send him for a CPX recently.  He had a submax effort.  Peak VO2 was 17.5.     He says that he is not getting any new symptoms.  He is tolerated the meds as we slowly titrated.  He denies any chest pressure, neck or arm discomfort.  He said no palpitations, presyncope or syncope.  He said no weight gain or edema.   No Known Allergies  Current Outpatient Medications  Medication Sig Dispense Refill  . carvedilol (COREG) 12.5 MG tablet TAKE 1 TABLET BY MOUTH 2 TIMES DAILY WITH A MEAL. 180 tablet PRN  . carvedilol (COREG) 3.125 MG tablet TAKE 1 TABLET BY MOUTH TWICE A DAY (TAKE WITH 12.5 MG TABLETS) 120 tablet 0  . spironolactone (ALDACTONE) 25 MG tablet Take 0.5 tablets (12.5 mg total) by mouth daily. 90 tablet 1  . sacubitril-valsartan (ENTRESTO) 24-26 MG Take 1 tablet by mouth 2 (two) times daily. 60 tablet 0   No current facility-administered medications for this visit.     Past Medical History:  Diagnosis Date  . CHF (congestive heart failure) ([facility])   . Hypertension   . Sleep apnea    CPAP    Past Surgical History:  Procedure Laterality Date  . NO PAST SURGERIES      ROS:   As stated in the HPI and negative for all other systems.  PHYSICAL EXAM BP 126/66   Pulse 77   Ht 5\' 7"  (1.702 m)   Wt 240 lb (108.9 kg)   SpO2 98%   BMI 37.59 kg/m  GENERAL:  Well appearing NECK:  No jugular venous distention, waveform within normal limits, carotid upstroke brisk and symmetric, no bruits, no thyromegaly LUNGS:  Clear to auscultation bilaterally CHEST:   Unremarkable HEART:  PMI not displaced or sustained,S1 and S2 within normal limits, no S3, no S4, no clicks, no rubs, no murmurs ABD:  Flat, positive bowel sounds normal in frequency in pitch, no bruits, no rebound, no guarding, no midline pulsatile mass, no hepatomegaly, no splenomegaly EXT:  2 plus pulses throughout, no edema, no cyanosis no clubbing   EKG:  NA  ASSESSMENT AND PLAN  HTN:   This is being managed in the context of treating his CHF  CHF:     I am going to try to get him on Entresto but cost will be the biggest issue.  I will start 24/26 mg bid and stop the lisinopril for 36 hours prior.  And she is uninsured were going to need to get some help for him to afford this.  TOBACCO ABUSE:  He is encouraged to stop smoking.   SLEEP APNEA:    He tolerates this.   OVERWEIGHT:  He has a specific plan and has lost some weight.  He was eating at [employer] every morning and he has stopped this.  He drinks 3 or 4 beers on the weekends and we talked about this.

## 2018-02-10 ENCOUNTER — Ambulatory Visit (INDEPENDENT_AMBULATORY_CARE_PROVIDER_SITE_OTHER): Payer: Self-pay | Admitting: Cardiology

## 2018-02-10 ENCOUNTER — Encounter: Payer: Self-pay | Admitting: Cardiology

## 2018-02-10 VITALS — BP 126/66 | HR 77 | Ht 67.0 in | Wt 240.0 lb

## 2018-02-10 DIAGNOSIS — I420 Dilated cardiomyopathy: Secondary | ICD-10-CM

## 2018-02-10 DIAGNOSIS — Z720 Tobacco use: Secondary | ICD-10-CM

## 2018-02-10 DIAGNOSIS — E663 Overweight: Secondary | ICD-10-CM

## 2018-02-10 MED ORDER — SACUBITRIL-VALSARTAN 24-26 MG PO TABS: 1.0000 | ORAL_TABLET | Freq: Two times a day (BID) | ORAL | 0 refills | Status: DC

## 2018-02-10 MED FILL — ENTRESTO 24 MG-26 MG TABLET: 24-26 | 30 days supply | Qty: 60 | Fill #0

## 2018-02-10 NOTE — Patient Instructions (Addendum)
Medication Instructions:  START- Entresto 24/26 mg 1 tablet twice a day  STOP- Lisinopril  If you need a refill on your cardiac medications before your next appointment, please call your pharmacy.  Labwork: None Ordered   If you have labs (blood work) drawn today and your tests are completely normal, you will receive your results only by: Marland Kitchen MyChart Message (if you have MyChart) OR . A paper copy in the mail If you have any lab test that is abnormal or we need to change your treatment, we will call you to review the results.  Testing/Procedures: None Ordered   Follow-Up: You will need a follow up appointment in 1 Months with Dr Antoine Poche..    . Rhonda Barrett, PA-C . Joni Reining, DNP, ANP  At [facility], you and your health needs are our priority.  As part of our continuing mission to provide you with exceptional heart care, we have created designated Provider Care Teams.  These Care Teams include your primary Cardiologist (physician) and Advanced Practice Providers (APPs -  Physician Assistants and Nurse Practitioners) who all work together to provide you with the care you need, when you need it.   Thank you for choosing [facility] at [facility]!!

## 2018-03-10 ENCOUNTER — Telehealth (HOSPITAL_COMMUNITY): Payer: Self-pay | Admitting: Surgery

## 2018-03-10 NOTE — Telephone Encounter (Signed)
I called Cody Carpenter regarding his referral to the AHF Clinic and to make an appt.  I left a message for a return call.

## 2018-03-17 ENCOUNTER — Ambulatory Visit: Payer: Self-pay | Admitting: Cardiology

## 2018-03-18 NOTE — Progress Notes (Signed)
     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  His EF had been 20% in 2008 but improved with medical management. Unfortunately he eventually stopped his meds and his EF went from a high of 55% to 25% in 2015 and after med titration only went up to 30% in 2016.  However, in August his EF was 20 - 25%.   I have had a difficult time titrating his meds because of hypotension.  I did send him for a CPX recently.  He had a submax effort.  Peak VO2 was 17.5.   At the last visit I started [facility].    He did well with this.  The patient denies any new symptoms such as chest discomfort, neck or arm discomfort. There has been no new shortness of breath, PND or orthopnea. There have been no reported palpitations, presyncope or syncope.  No Known Allergies  Current Outpatient Medications  Medication Sig Dispense Refill  . carvedilol (COREG) 12.5 MG tablet TAKE 1 TABLET BY MOUTH 2 TIMES DAILY WITH A MEAL. 180 tablet PRN  . carvedilol (COREG) 3.125 MG tablet TAKE 1 TABLET BY MOUTH TWICE A DAY (TAKE WITH 12.5 MG TABLETS) 120 tablet 0  . sacubitril-valsartan (ENTRESTO) 24-26 MG Take 1 tablet by mouth 2 (two) times daily. 60 tablet 0  . spironolactone (ALDACTONE) 25 MG tablet Take 0.5 tablets (12.5 mg total) by mouth daily. 90 tablet 1   No current facility-administered medications for this visit.     Past Medical History:  Diagnosis Date  . CHF (congestive heart failure) ([facility])   . Hypertension   . Sleep apnea    CPAP    Past Surgical History:  Procedure Laterality Date  . NO PAST SURGERIES      ROS:   As stated in the HPI and negative for all other systems.  PHYSICAL EXAM BP 102/60   Pulse 72   Ht 5\' 7"  (1.702 m)   Wt 236 lb (107 kg)   SpO2 97%   BMI 36.96 kg/m  GENERAL:  Well appearing NECK:  No jugular venous distention, waveform within normal limits, carotid upstroke brisk and symmetric, no bruits, no thyromegaly LUNGS:  Clear to  auscultation bilaterally CHEST:  Unremarkable HEART:  PMI not displaced or sustained,S1 and S2 within normal limits, no S3, no S4, no clicks, no rubs, no murmurs ABD:  Flat, positive bowel sounds normal in frequency in pitch, no bruits, no rebound, no guarding, no midline pulsatile mass, no hepatomegaly, no splenomegaly EXT:  2 plus pulses throughout, no edema, no cyanosis no clubbing   EKG:  NA  ASSESSMENT AND PLAN  HTN:   This is being managed in the context of treating his CHF  CHF:     Today I will increase the Entresto.  I suspect that I will not be able to move more quickly with his meds.  I will wait to see him back for two months to see if I can make any further med titration.  Otherwise, he will continue meds as listed.  Follow up BMET in two weeks.   TOBACCO ABUSE:   He is again encouraged to stop smoking.   SLEEP APNEA:    He is treated for this.   OVERWEIGHT:  He continues to lose weight.  I encourage more of this.

## 2018-03-20 ENCOUNTER — Ambulatory Visit (INDEPENDENT_AMBULATORY_CARE_PROVIDER_SITE_OTHER): Payer: Self-pay | Admitting: Cardiology

## 2018-03-20 ENCOUNTER — Encounter: Payer: Self-pay | Admitting: Cardiology

## 2018-03-20 VITALS — BP 102/60 | HR 72 | Ht 67.0 in | Wt 236.0 lb

## 2018-03-20 DIAGNOSIS — Z79899 Other long term (current) drug therapy: Secondary | ICD-10-CM

## 2018-03-20 DIAGNOSIS — I420 Dilated cardiomyopathy: Secondary | ICD-10-CM

## 2018-03-20 DIAGNOSIS — Z720 Tobacco use: Secondary | ICD-10-CM

## 2018-03-20 MED ORDER — SACUBITRIL-VALSARTAN 49-51 MG PO TABS: 1.0000 | ORAL_TABLET | Freq: Two times a day (BID) | ORAL | 11 refills | Status: DC

## 2018-03-20 NOTE — Patient Instructions (Signed)
Medication Instructions:  INCREASE- Entresto 49/51 mg twice a day  If you need a refill on your cardiac medications before your next appointment, please call your pharmacy.  Labwork: BMP in 2 weeks HERE IN OUR OFFICE AT [facility]  Take the provided lab slips with you to the lab for your blood draw.   You will NOT need to fast   If you have labs (blood work) drawn today and your tests are completely normal, you will receive your results only by: Marland Kitchen MyChart Message (if you have MyChart) OR . A paper copy in the mail If you have any lab test that is abnormal or we need to change your treatment, we will call you to review the results.  Testing/Procedures: None Ordered   Follow-Up: . You will need a follow up appointment in 2 Month with Dr Antoine Poche.    At [facility], you and your health needs are our priority.  As part of our continuing mission to provide you with exceptional heart care, we have created designated Provider Care Teams.  These Care Teams include your primary Cardiologist (physician) and Advanced Practice Providers (APPs -  Physician Assistants and Nurse Practitioners) who all work together to provide you with the care you need, when you need it.   Thank you for choosing [facility] at [facility]!!

## 2018-03-21 ENCOUNTER — Telehealth (HOSPITAL_COMMUNITY): Payer: Self-pay | Admitting: Surgery

## 2018-03-21 NOTE — Telephone Encounter (Signed)
I spoke with Mr. Dockter and he now has appt scheduled in the AHF Clinic 04/06/18 at 3:00pm.

## 2018-03-21 NOTE — Telephone Encounter (Signed)
I attempted to reach Cody Carpenter in reference to his referral and to schedule an appt in [facility] Clinic.  I have left a message and asked that he call back.

## 2018-04-06 ENCOUNTER — Ambulatory Visit (HOSPITAL_COMMUNITY): Payer: Self-pay

## 2018-04-10 ENCOUNTER — Telehealth: Payer: Self-pay | Admitting: Cardiology

## 2018-04-10 ENCOUNTER — Other Ambulatory Visit: Payer: Self-pay | Admitting: Cardiology

## 2018-04-10 MED ORDER — SACUBITRIL-VALSARTAN 49-51 MG PO TABS: 1.0000 | ORAL_TABLET | Freq: Two times a day (BID) | ORAL | 11 refills | Status: DC

## 2018-04-10 MED FILL — SPIRONOLACTONE 25 MG TABLET: 25 | 90 days supply | Qty: 45 | Fill #1

## 2018-04-10 NOTE — Telephone Encounter (Signed)
New Message   Pt state his medications was sent to the wrong pharmacy and he is needing it sent to the [facility] outpatient pharmacy on [address]   *STAT* If patient is at the pharmacy, call can be transferred to refill team.   1. Which medications need to be refilled? (please list name of each medication and dose if known) sacubitril-valsartan (ENTRESTO) 49-51 MG  2. Which pharmacy/location (including street and city if local pharmacy) is medication to be sent to? [facility] - [place], [place] - [address]  3. Do they need a 30 day or 90 day supply?

## 2018-04-10 NOTE — Telephone Encounter (Signed)
Medication sent to pharmacy. Patient notified.

## 2018-04-10 NOTE — Telephone Encounter (Signed)
Left message for pt to call ? What meds?

## 2018-04-10 NOTE — Telephone Encounter (Signed)
Spoke with pt, aware samples are at the front desk for pick up. He reports he filled out patient assistance paperwork 3 weeks ago and has not heard. Will forward to nya, dr hochrein's medical assist.

## 2018-04-10 NOTE — Telephone Encounter (Signed)
Follow up   Patient only needs the sacubitril-valsartan (ENTRESTO) 49-51 MG to be filled.

## 2018-04-10 NOTE — Telephone Encounter (Signed)
  Pt c/o medication issue:  1. Name of Medication:sacubitril-valsartan (ENTRESTO) 49-51 MG  2. How are you currently taking this medication (dosage and times per day)? Take 1 tablet by mouth 2 (two) times daily.  3. Are you having a reaction (difficulty breathing--STAT)?  No  4. What is your medication issue? The coupon our office gave him to help with the cost of the prescription could not be used per pharmacist. He cannot get the medicine due to the cost. Do we have samples or is there another medication we can give him?

## 2018-04-12 MED FILL — CARVEDILOL 3.125 MG TABLET: 3.125 | 90 days supply | Qty: 180 | Fill #0

## 2018-04-14 ENCOUNTER — Telehealth: Payer: Self-pay | Admitting: Cardiology

## 2018-04-14 NOTE — Telephone Encounter (Signed)
New Message    *STAT* If patient is at the pharmacy, call can be transferred to refill team.   1. Which medications need to be refilled? (please list name of each medication and dose if known)  sacubitril-valsartan (ENTRESTO) 49-51 MG Take 1 tablet by mouth 2 (two) times daily.     2. Which pharmacy/location (including street and city if local pharmacy) is medication to be sent to? See below fax hard copy with patient name and dob - on it to pharmacy   3. Do they need a 30 day or 90 day supply? 30 or 90      [phone] [facility] [address] [facility]

## 2018-04-14 NOTE — Telephone Encounter (Signed)
Call and spoke with pt to let him know he has been accepted for [facility], and to let him know he need to call and set up shipping arrangement.

## 2018-04-17 MED ORDER — SACUBITRIL-VALSARTAN 49-51 MG PO TABS: 1.0000 | ORAL_TABLET | Freq: Two times a day (BID) | ORAL | 6 refills | Status: DC

## 2018-04-17 NOTE — Telephone Encounter (Signed)
CALLED PT HE STATES THAT HE CAN GET HIS RX FOR FREE AT THIS PHARMACY PLEASE HAVE [facility] SIGN AND FAX AND LET PT KNOW WHEN IT HAS BEEN FAXED

## 2018-04-19 NOTE — Telephone Encounter (Signed)
Called [employer] pt assistance they state that pt does not need a new rx this current rx is good until 02-10-2019 and he is approved until then. They state that pt needs to call them ar [phone] option #2 and arrange a time when he will be home to ACCEPT their package. They have been trying to get ahold of him since some time after October. I have left a detailed message stating this and the phone number to call to get his medication shipped.

## 2018-04-21 ENCOUNTER — Telehealth: Payer: Self-pay | Admitting: Cardiology

## 2018-04-21 MED ORDER — SACUBITRIL-VALSARTAN 49-51 MG PO TABS: 1.0000 | ORAL_TABLET | Freq: Two times a day (BID) | ORAL | 2 refills | Status: DC

## 2018-04-21 NOTE — Telephone Encounter (Signed)
  *  STAT* If patient is at the pharmacy, call can be transferred to refill team.   1. Which medications need to be refilled? (please list name of each medication and dose if known) sacubitril-valsartan (ENTRESTO) 49-51 MG  2. Which pharmacy/location (including street and city if local pharmacy) is medication to be sent to? [facility] in [place] patient states he gave fax number to nurse  3. Do they need a 30 day or 90 day supply? 90  Patient has used all the samples and needs this script called in to pharmacy.

## 2018-04-24 ENCOUNTER — Telehealth: Payer: Self-pay | Admitting: Cardiology

## 2018-04-24 MED ORDER — SACUBITRIL-VALSARTAN 49-51 MG PO TABS: 1.0000 | ORAL_TABLET | Freq: Two times a day (BID) | ORAL | 2 refills | Status: DC

## 2018-04-24 NOTE — Telephone Encounter (Signed)
Spoke with pt who requests Rx for Entresto 49-51 mg to be sent to [facility] in [place]. Pt states that he receives the medication at no cost and the Rx needs to be sent to this pharmacy in order for his med to be mailed to him. Pt states he only has 3 tablets left. Informed pt that a sample bottle will be left for him at the front desk; pt verbalized understanding

## 2018-04-24 NOTE — Telephone Encounter (Signed)
New message   Pt c/o medication issue:  1. Name of Medication: sacubitril-valsartan (ENTRESTO) 49-51 MG  2. How are you currently taking this medication (dosage and times per day)? 1 time daily  3. Are you having a reaction (difficulty breathing--STAT)? No   4. What is your medication issue? Patient states that a prescription needs to faxed for a higher dosage to [facility] in [place]. Fax [phone]

## 2018-05-01 ENCOUNTER — Ambulatory Visit (HOSPITAL_COMMUNITY): Payer: Self-pay

## 2018-05-04 ENCOUNTER — Other Ambulatory Visit: Payer: Self-pay | Admitting: Cardiology

## 2018-05-04 MED ORDER — SACUBITRIL-VALSARTAN 49-51 MG PO TABS: 1.0000 | ORAL_TABLET | Freq: Two times a day (BID) | ORAL | 3 refills | Status: DC

## 2018-05-04 NOTE — Telephone Encounter (Signed)
New Message     *STAT* If patient is at the pharmacy, call can be transferred to refill team.   1. Which medications need to be refilled? (please list name of each medication and dose if known) Entresto 49-51mg    2. Which pharmacy/location (including street and city if local pharmacy) is medication to be sent to?[facility] in [place], [place]  3. Do they need a 30 day or 90 day supply? 90 day supply   Pharmacy calling stating the prescription sent to wrong pharmacy would like a nurse to call them back to make sure it gets sent to correct pharmacy

## 2018-05-04 NOTE — Telephone Encounter (Signed)
Spoke with pt to confirm Rx is sent to right pharmacy. Pt stated that Rx should go to [facility] in [place] with a 90 day prescription. Pt verbalized confirmation and thanked me for the call.

## 2018-05-05 ENCOUNTER — Telehealth: Payer: Self-pay | Admitting: Cardiology

## 2018-05-05 MED ORDER — SACUBITRIL-VALSARTAN 49-51 MG PO TABS: 1.0000 | ORAL_TABLET | Freq: Two times a day (BID) | ORAL | 3 refills | Status: DC

## 2018-05-05 NOTE — Telephone Encounter (Signed)
New Message   Pt c/o medication issue:  1. Name of Medication: sacubitril-valsartan (ENTRESTO) 49-51 MG  2. How are you currently taking this medication (dosage and times per day)? Take 1 tablet by mouth 2 (two) times daily.  3. Are you having a reaction (difficulty breathing--STAT)? no  4. What is your medication issue? [facility] is calling to get verbals on the dosage

## 2018-05-05 NOTE — Telephone Encounter (Signed)
Call received from pt mail order pharmacy [facility]. They are needing verbal order to refill the pt Entresto. Spoke with the pharmacist [place]. Verbal order given to refill Entresto 49-51 mg bid. #[id number] Also spoke with the rep Kat who sts that they will get the pt Rx mailed to him asap.

## 2018-05-11 MED FILL — CARVEDILOL 12.5 MG TABLET: 12.5 | 90 days supply | Qty: 180 | Fill #3

## 2018-05-22 ENCOUNTER — Ambulatory Visit (HOSPITAL_COMMUNITY): Payer: Self-pay

## 2018-06-01 NOTE — Progress Notes (Deleted)
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  His EF had been 20% in 2008 but improved with medical management. Unfortunately he eventually stopped his meds and his EF went from a high of 55% to 25% in 2015 and after med titration only went up to 30% in 2016.  However, in August his EF was 20 - 25%.  He had a CPX.  He had a submax effort.  Peak VO2 was 17.5.   At the last visit I started [facility].  ***  He did well with this.  The patient denies any new symptoms such as chest discomfort, neck or arm discomfort. There has been no new shortness of breath, PND or orthopnea. There have been no reported palpitations, presyncope or syncope.  No Known Allergies  Current Outpatient Medications  Medication Sig Dispense Refill  . carvedilol (COREG) 12.5 MG tablet TAKE 1 TABLET BY MOUTH 2 TIMES DAILY WITH A MEAL. 180 tablet PRN  . carvedilol (COREG) 3.125 MG tablet TAKE 1 TABLET BY MOUTH TWICE A DAY (TAKE WITH 12.5 MG TABLETS) 180 tablet 3  . sacubitril-valsartan (ENTRESTO) 49-51 MG Take 1 tablet by mouth 2 (two) times daily. 180 tablet 3  . spironolactone (ALDACTONE) 25 MG tablet Take 0.5 tablets (12.5 mg total) by mouth daily. 90 tablet 1   No current facility-administered medications for this visit.     Past Medical History:  Diagnosis Date  . CHF (congestive heart failure) (HCC)   . Hypertension   . Sleep apnea    CPAP    Past Surgical History:  Procedure Laterality Date  . NO PAST SURGERIES      ROS:   ***  PHYSICAL EXAM There were no vitals taken for this visit. GENERAL:  Well appearing NECK:  No jugular venous distention, waveform within normal limits, carotid upstroke brisk and symmetric, no bruits, no thyromegaly LUNGS:  Clear to auscultation bilaterally CHEST:  Unremarkable HEART:  PMI not displaced or sustained,S1 and S2 within normal limits, no S3, no S4, no clicks, no rubs, *** murmurs ABD:  Flat, positive bowel sounds  normal in frequency in pitch, no bruits, no rebound, no guarding, no midline pulsatile mass, no hepatomegaly, no splenomegaly EXT:  2 plus pulses throughout, no edema, no cyanosis no clubbing    ***GENERAL:  Well appearing NECK:  No jugular venous distention, waveform within normal limits, carotid upstroke brisk and symmetric, no bruits, no thyromegaly LUNGS:  Clear to auscultation bilaterally CHEST:  Unremarkable HEART:  PMI not displaced or sustained,S1 and S2 within normal limits, no S3, no S4, no clicks, no rubs, no murmurs ABD:  Flat, positive bowel sounds normal in frequency in pitch, no bruits, no rebound, no guarding, no midline pulsatile mass, no hepatomegaly, no splenomegaly EXT:  2 plus pulses throughout, no edema, no cyanosis no clubbing   EKG:  ***  ASSESSMENT AND PLAN  HTN:    *** This is being managed in the context of treating his CHF  CHF:   ***   Today I will increase the Entresto.  I suspect that I will not be able to move more quickly with his meds.  I will wait to see him back for two months to see if I can make any further med titration.  Otherwise, he will continue meds as listed.  Follow up BMET in two weeks.   TOBACCO ABUSE:  ***  He is again encouraged to stop smoking.  SLEEP APNEA:    ***  He is treated for this.   OVERWEIGHT:  ***   He continues to lose weight.  I encourage more of this.

## 2018-06-02 ENCOUNTER — Ambulatory Visit: Payer: Self-pay | Admitting: Cardiology

## 2018-06-02 DIAGNOSIS — R0989 Other specified symptoms and signs involving the circulatory and respiratory systems: Secondary | ICD-10-CM

## 2018-06-05 ENCOUNTER — Encounter: Payer: Self-pay | Admitting: *Deleted

## 2018-06-07 ENCOUNTER — Ambulatory Visit (HOSPITAL_COMMUNITY): Payer: Self-pay

## 2018-06-07 ENCOUNTER — Telehealth (HOSPITAL_COMMUNITY): Payer: Self-pay | Admitting: Licensed Clinical Social Worker

## 2018-06-07 NOTE — Telephone Encounter (Signed)
CSW consulted to speak with pt regarding multiple no shows and cancellations   CSW spoke with pt who reports there were not any preventable issues that caused him to cancel or no come to appointments- he states he is aware of appt scheduled for next week and does not forsee any issues coming to that appt.  CSW encouraged pt to call the clinic if there is an issues making it next week and we would help however we could- pt voiced understanding  CSW will continue to follow and assist as needed  Burna Sis, LCSW Clinical Social Worker [facility] [phone]

## 2018-06-15 ENCOUNTER — Ambulatory Visit (HOSPITAL_COMMUNITY): Payer: Self-pay

## 2018-06-15 ENCOUNTER — Other Ambulatory Visit: Payer: Self-pay

## 2018-06-15 ENCOUNTER — Encounter (HOSPITAL_COMMUNITY): Payer: Self-pay

## 2018-06-15 ENCOUNTER — Ambulatory Visit (HOSPITAL_COMMUNITY)
Admission: RE | Admit: 2018-06-15 | Discharge: 2018-06-15 | Disposition: A | Discharge status: Home / Self Care | Payer: Self-pay | Source: Ambulatory Visit | Attending: Cardiology | Admitting: Cardiology

## 2018-06-15 VITALS — BP 128/84 | HR 64 | Wt 238.0 lb

## 2018-06-15 DIAGNOSIS — I10 Essential (primary) hypertension: Secondary | ICD-10-CM

## 2018-06-15 DIAGNOSIS — Z6837 Body mass index (BMI) 37.0-37.9, adult: Secondary | ICD-10-CM | POA: Insufficient documentation

## 2018-06-15 DIAGNOSIS — E663 Overweight: Secondary | ICD-10-CM

## 2018-06-15 DIAGNOSIS — Z720 Tobacco use: Secondary | ICD-10-CM

## 2018-06-15 DIAGNOSIS — G4730 Sleep apnea, unspecified: Secondary | ICD-10-CM | POA: Insufficient documentation

## 2018-06-15 DIAGNOSIS — I509 Heart failure, unspecified: Secondary | ICD-10-CM

## 2018-06-15 DIAGNOSIS — I5022 Chronic systolic (congestive) heart failure: Secondary | ICD-10-CM | POA: Insufficient documentation

## 2018-06-15 DIAGNOSIS — I428 Other cardiomyopathies: Secondary | ICD-10-CM | POA: Insufficient documentation

## 2018-06-15 DIAGNOSIS — I110 Hypertensive heart disease with heart failure: Secondary | ICD-10-CM | POA: Insufficient documentation

## 2018-06-15 DIAGNOSIS — Z79899 Other long term (current) drug therapy: Secondary | ICD-10-CM | POA: Insufficient documentation

## 2018-06-15 DIAGNOSIS — F17210 Nicotine dependence, cigarettes, uncomplicated: Secondary | ICD-10-CM | POA: Insufficient documentation

## 2018-06-15 DIAGNOSIS — F1010 Alcohol abuse, uncomplicated: Secondary | ICD-10-CM | POA: Insufficient documentation

## 2018-06-15 DIAGNOSIS — E669 Obesity, unspecified: Secondary | ICD-10-CM | POA: Insufficient documentation

## 2018-06-15 DIAGNOSIS — Z8249 Family history of ischemic heart disease and other diseases of the circulatory system: Secondary | ICD-10-CM | POA: Insufficient documentation

## 2018-06-15 LAB — BASIC METABOLIC PANEL
Anion gap: 7 (ref 5–15)
BUN: 22 mg/dL — ABNORMAL HIGH (ref 6–20)
CO2: 22 mmol/L (ref 22–32)
Calcium: 9.4 mg/dL (ref 8.9–10.3)
Chloride: 107 mmol/L (ref 98–111)
Creatinine, Ser: 1.06 mg/dL (ref 0.61–1.24)
GFR calc Af Amer: >60 mL/min (ref >60)
GFR calc non Af Amer: >60 mL/min (ref >60)
Glucose, Bld: 98 mg/dL (ref 70–99)
Potassium: 4.6 mmol/L (ref 3.5–5.1)
Sodium: 136 mmol/L (ref 135–145)

## 2018-06-15 MED ORDER — SPIRONOLACTONE 25 MG PO TABS: 25.0000 mg | ORAL_TABLET | Freq: Every day | ORAL | 1 refills | Status: DC

## 2018-06-15 NOTE — Progress Notes (Signed)
[facility] Note   Referring Physician: PCP: Patient, No Pcp Per PCP-Cardiologist: No primary care provider on file.   HPI:  Cody Carpenter is a 53 y.o. male with chronic systolic CHF thought to be non-ischemic, HTN, Tobacco abuse, Sleep apnea, and obesity.   Followed by Dr. Antoine Poche. Last seen 03/20/18. Most recent Echo 12/2017 LVEF 20-25%, Grade 3 DD, Mod LAE, Mild RV dilation. Previously had improved from 20% to 55% on medications. He stopped his medicines, and EF came back down to 25-30%. It has not improved with continued med titration, so he was referred to the HF clinic.  CPX text with submax effort, and no significant circulatory limitation.   He presents today to establish in the HF clinic. Doing well overall. He works part time with a [employer]. He does not have insurance, and hasn't tried to file for disability in nearly 15 years. Lives at home alone. Smokes 1 ppd. Drinks "8-9 beers" "a couple of days a week". He denies any undue SOB. Not very active. He denies lightheadedness or dizziness. He did get lightheaded with attempts to increase his coreg. He is not on a diuretic. He denies family history of cardiac disease or sudden cardiac death. He is taking all medication as directed.   Past Medical History 1. Chronic systolic HF - Cath in 2008 with "normal coronaries" (Report not available) - Echo 12/2017 LVEF 20-25%, Grade 3 DD, Mod LAE, Mild RV dilation.  - Echo 03/2015 LVEF 25-30%, Grade 1 DD, Mod LAE, Normal RV, Trivial PI - Echo 06/2013 LVEF 25-30%, Grade 1 DD, Mod LAE - Echo 09/2007 LVEF 55% - Echo 02/2007 LVEF 20% - No ischemic work up. 2. HTN 3. Tobacco abuse - Smokes 1 ppd 4. ETOH abuse - Drinks "8-9" beers, twice weekly.  5. Sleep apnea 6. Obesity.   CPX 01/2018 Pre-Exercise PFTs  FVC 3.31 (73%), FEV1 2.45 (68%) ,FEV1/FVC 74 (93%)     MVV 93 (64%)   Exercise Time:  7:15  Speed (mph): 2.0    Grade (%): 10.5   RPE: 18 Reason  stopped: Patient ended test due to dyspnea (10/10) Additional symptoms: lightheaded (8/10) and chest tightness (6/10) Resting HR: 67 Peak HR: 116  (68% age predicted max HR) BP rest: 106/62 BP peak: 138/76 Peak VO2: 17.5 (67% predicted peak VO2) VE/VCO2 slope: 31 OUES: 2.46 Peak RER: 0.89 Ventilatory Threshold: 15.5 (59% predicted and 89% measured peak VO2) Peak RR 32 Peak Ventilation: 58.6 VE/MVV: 63% PETCO2 at peak: 32 O2pulse: 17  (100% predicted O2pulse) Interpretation: Per Dr. Gala Romney: Markedly submaximal effort. Based on available data patitent has a mild to moderate functional limitation due to his obesity and related ventilatory limitation. Despite very low EF, I do not see a significant circulatory limitation on this test.  Review of systems complete and found to be negative unless listed in HPI.    Past Medical History:  Diagnosis Date  . CHF (congestive heart failure) (HCC)   . Hypertension   . Sleep apnea    CPAP    Current Outpatient Medications  Medication Sig Dispense Refill  . carvedilol (COREG) 12.5 MG tablet TAKE 1 TABLET BY MOUTH 2 TIMES DAILY WITH A MEAL. 180 tablet PRN  . carvedilol (COREG) 3.125 MG tablet TAKE 1 TABLET BY MOUTH TWICE A DAY (TAKE WITH 12.5 MG TABLETS) 180 tablet 3  . sacubitril-valsartan (ENTRESTO) 49-51 MG Take 1 tablet by mouth 2 (two) times daily. 180 tablet 3  . spironolactone (ALDACTONE) 25  MG tablet Take 1 tablet (25 mg total) by mouth at bedtime. 90 tablet 1   No current facility-administered medications for this encounter.     No Known Allergies    Social History   Socioeconomic History  . Marital status: Single    Spouse name: Not on file  . Number of children: 3  . Years of education: Not on file  . Highest education level: Not on file  Occupational History  . Occupation: roofer  Social Needs  . Financial resource strain: Not on file  . Food insecurity:    Worry: Not on file    Inability: Not on file  .  Transportation needs:    Medical: Not on file    Non-medical: Not on file  Tobacco Use  . Smoking status: Current Every Day Smoker    Packs/day: 1.00    Years: 25.00    Pack years: 25.00    Types: Cigarettes  . Smokeless tobacco: Never Used  Substance and Sexual Activity  . Alcohol use: Yes  . Drug use: No  . Sexual activity: Not on file  Lifestyle  . Physical activity:    Days per week: Not on file    Minutes per session: Not on file  . Stress: Not on file  Relationships  . Social connections:    Talks on phone: Not on file    Gets together: Not on file    Attends religious service: Not on file    Active member of club or organization: Not on file    Attends meetings of clubs or organizations: Not on file    Relationship status: Not on file  . Intimate partner violence:    Fear of current or ex partner: Not on file    Emotionally abused: Not on file    Physically abused: Not on file    Forced sexual activity: Not on file  Other Topics Concern  . Not on file  Social History Narrative  . Not on file      Family History  Problem Relation Age of Onset  . Heart disease Maternal Grandfather   . Lung cancer Maternal Grandmother     Vitals:   06/15/18 1401  BP: 128/84  Pulse: 64  SpO2: 96%  Weight: 108 kg (238 lb)   Wt Readings from Last 3 Encounters:  06/15/18 108 kg (238 lb)  03/20/18 107 kg (236 lb)  02/10/18 108.9 kg (240 lb)    PHYSICAL EXAM: General:  Well appearing. No respiratory difficulty HEENT: normal Neck: supple. no JVD. Carotids 2+ bilat; no bruits. No lymphadenopathy or thyromegaly appreciated. Cor: PMI nondisplaced. Regular rate & rhythm. No rubs, gallops or murmurs. Lungs: clear Abdomen: soft, nontender, nondistended. No hepatosplenomegaly. No bruits or masses. Good bowel sounds. Extremities: no cyanosis, clubbing, rash, edema Neuro: alert & oriented x 3, cranial nerves grossly intact. moves all 4 extremities w/o difficulty. Affect  pleasant.  ECG: NSR 73 bpm with occasional PVCs, QRS 146 ms, personally reviewed.  ASSESSMENT & PLAN:  1. Chronic systolic HF, ? NICM  - Initial cath in 2008 with normal coronaries. EF improved to 55% with med titration. He came off of his medications, and his EF back to 25-30%. It has not come back up despite med titration. NYHA II symptoms. Volume status stable on exam. He does not take lasix.  - Echo 12/2017 LVEF 20-25%, Grade 3 DD, Mod LAE, Mild RV dilation.  - He has no insurance, so currently cannot do cMRI as  planned, or catheterization. Will continue to titrate meds and have SW see to work on Community education officer. - Continue coreg 15.625 mg BID (He takes one 12.5 mg tablet and one 3.125 mg tablet twice daily) He has been intolerant to any up-titration.  - Continue Entresto 49/51 mg BID - Increase spironolactone to 25 mg daily. BMET today and 10-14 days.  - Reinforced fluid restriction to < 2 L daily, sodium restriction to less than 2000 mg daily, and the importance of daily weights.   2. HTN - Meds as above.  3. Tobacco abuse - Smoking 1 ppd. Encouraged complete cessation.  4. Sleep apnea - Continue nightly CPAP.  5. Obesity - Body mass index is 37.28 kg/m.  6. ETOH abuse - Drinks "8-9 beers twice a week" - Encouraged cessation.   Increase spiro as above and move to bedtime. No insurance, so cannot get cMRI or cath at this time. Will ask SW to see, and will titrate meds in the mean time. Follow renal function carefully. RTC 6 weeks. Sooner with symptoms.   Graciella Freer, PA-C 06/15/18   Greater than 50% of the 45 minute visit was spent in counseling/coordination of care regarding disease state education, salt/fluid restriction, sliding scale diuretics, and medication compliance.

## 2018-06-15 NOTE — Patient Instructions (Signed)
Labs done today. We will contact you for any abnormal lab work.  Labs will need to be done again in 10-14 days  INCREASE Spironolactone 25mg  (1 tab) daily at bedtime  Your physician has requested that you have a cardiac MRI. Cardiac MRI uses a computer to create images of your heart as its beating, producing both still and moving pictures of your heart and major blood vessels. For further information please visit [email or web address]. Please follow the instruction sheet given to you today for more information.  Follow up with the Advanced Practice Provider in 6 weeks.

## 2018-06-16 NOTE — Addendum Note (Signed)
Encounter addended by: Burna Sis, LCSW on: 06/16/2018 7:31 AM  Actions taken: Clinical Note Signed

## 2018-06-16 NOTE — Progress Notes (Signed)
CSW consulted to speak with pt regarding lack of insurance.  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  Sites his medical problems as why he has not been working full time for this long but states last time he applied for disability was 15 years ago and he was denied at that time.  CSW provided with packet regarding how to apply for disability and gave pt phone number to call and set up an appointment at his local office to file disability claim.  CSW also discussed how to apply for Medicaid.  Provided pt with direct phone number to contact me in case of further questions or concerns or if he is assigned a case worker so CSW can be in contact with them to help speed up the process.  CSW will continue to follow and assist as needed  Burna Sis, LCSW Clinical Social Worker [facility] [phone]

## 2018-06-26 MED FILL — SPIRONOLACTONE 25 MG TABLET: 25 | 90 days supply | Qty: 45 | Fill #2

## 2018-06-29 ENCOUNTER — Ambulatory Visit (HOSPITAL_COMMUNITY)
Admission: RE | Admit: 2018-06-29 | Discharge: 2018-06-29 | Disposition: A | Discharge status: Home / Self Care | Payer: Self-pay | Source: Ambulatory Visit | Attending: Internal Medicine | Admitting: Internal Medicine

## 2018-06-29 DIAGNOSIS — I509 Heart failure, unspecified: Secondary | ICD-10-CM | POA: Insufficient documentation

## 2018-06-29 LAB — BASIC METABOLIC PANEL
Anion gap: 7 (ref 5–15)
BUN: 22 mg/dL — ABNORMAL HIGH (ref 6–20)
CO2: 21 mmol/L — AB (ref 22–32)
Calcium: 9.1 mg/dL (ref 8.9–10.3)
Chloride: 109 mmol/L (ref 98–111)
Creatinine, Ser: 1.06 mg/dL (ref 0.61–1.24)
GFR calc Af Amer: >60 mL/min (ref >60)
GFR calc non Af Amer: >60 mL/min (ref >60)
GLUCOSE: 98 mg/dL (ref 70–99)
Potassium: 4.3 mmol/L (ref 3.5–5.1)
Sodium: 137 mmol/L (ref 135–145)

## 2018-07-26 ENCOUNTER — Other Ambulatory Visit: Payer: Self-pay | Admitting: Cardiology

## 2018-07-26 MED FILL — CARVEDILOL 12.5 MG TABLET: 12.5 | 90 days supply | Qty: 180 | Fill #0

## 2018-07-27 ENCOUNTER — Encounter (HOSPITAL_COMMUNITY): Payer: Self-pay

## 2018-08-14 ENCOUNTER — Telehealth: Payer: Self-pay | Admitting: Cardiology

## 2018-08-14 NOTE — Telephone Encounter (Signed)
LVM to pre reg. 08-14-18 ST °

## 2018-08-14 NOTE — Progress Notes (Signed)
Virtual Visit via Video Note   This visit type was conducted due to national recommendations for restrictions regarding the COVID-19 Pandemic (e.g. social distancing) in an effort to limit this patient's exposure and mitigate transmission in our community.  Due to his co-morbid illnesses, this patient is at least at moderate risk for complications without adequate follow up.  This format is felt to be most appropriate for this patient at this time.  All issues noted in this document were discussed and addressed.  A limited physical exam was performed with this format.  Please refer to the patient's chart for his consent to telehealth [facility].   (Of note, this started as a video visit and needed to be converted to phone.)  Evaluation Performed:  Follow-up visit  Date:  08/15/2018    ID:  Cody Carpenter, DOB 10/20/1965, MRN [id number]  Patient Location: Home  Provider Location: Home  PCP:  Patient, No Pcp Per  Cardiologist:  Rollene RotundaJames Breda Bond, MD  Electrophysiologist:  None   Chief Complaint:  Chronic systolic HF.    History of Present Illness:    Cody IhaWillie E Sallade is a 53 y.o. male who presents via audio/video conferencing for a telehealth visit today.    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  His EF had been 20% in 2008 but improved with medical management. Unfortunately he eventually stopped his meds and his EF went from a high of 55% to 25% in 2015 and after med titration only went up to 30% in 2016.  However, in August his EF was 20 - 25%.   I have had a difficult time titrating his meds because of hypotension.  I did send him for a CPX recently.  He had a submax effort.  Peak VO2 was 17.5.     Since I last saw him he was seen in the HF clinic. He had his spironolactone increased.  He had stable follow up lab.  He says he has been feeling okay since this.  He tolerated this medication well.  Is not having lightheadedness,  presyncope or syncope. The patient denies any new symptoms such as chest discomfort, neck or arm discomfort. There has been no new shortness of breath, PND or orthopnea. There have been no reported palpitations, presyncope or syncope.  The patient does not have symptoms concerning for COVID-19 infection (fever, chills, cough, or new shortness of breath).    Past Medical History:  Diagnosis Date  . CHF (congestive heart failure) (HCC)   . Hypertension   . Sleep apnea    CPAP   Past Surgical History:  Procedure Laterality Date  . NO PAST SURGERIES       Current Meds  Medication Sig  . carvedilol (COREG) 12.5 MG tablet TAKE 1 TABLET BY MOUTH TWICE A DAY WITH A MEAL.  . carvedilol (COREG) 3.125 MG tablet TAKE 1 TABLET BY MOUTH TWICE A DAY (TAKE WITH 12.5 MG TABLETS)  . sacubitril-valsartan (ENTRESTO) 49-51 MG Take 1 tablet by mouth 2 (two) times daily.  Marland Kitchen. spironolactone (ALDACTONE) 25 MG tablet Take 1 tablet (25 mg total) by mouth at bedtime.     Allergies:   Patient has no known allergies.   Social History   Tobacco Use  . Smoking status: Current Every Day Smoker    Packs/day: 1.00    Years: 25.00    Pack years: 25.00    Types: Cigarettes  . Smokeless tobacco: Never  Used  Substance Use Topics  . Alcohol use: Yes  . Drug use: No     Family Hx: The patient's family history includes Heart disease in his maternal grandfather; Lung cancer in his maternal grandmother.  ROS:   Please see the history of present illness.    As stated in the HPI and negative for all other systems.   Prior CV studies:   The following studies were reviewed today:  Labs  Labs/Other Tests and Data Reviewed:    EKG:  No ECG reviewed.  Recent Labs: 12/23/2017: BNP 97.8 06/29/2018: BUN 22; Creatinine, Ser 1.06; Potassium 4.3; Sodium 137   Recent Lipid Panel Lab Results  Component Value Date/Time   CHOL  03/02/2007 08:38 AM    170        ATP III CLASSIFICATION:  <200     mg/dL   Desirable   003-491  mg/dL   Borderline High  >=791    mg/dL   High   TRIG 505 (H) 69/79/4801 08:38 AM   HDL 26 (L) 03/02/2007 08:38 AM   CHOLHDL 6.5 03/02/2007 08:38 AM   LDLCALC  03/02/2007 08:38 AM    86        Total Cholesterol/HDL:CHD Risk Coronary Heart Disease Risk Table                     Men   Women  1/2 Average Risk   3.4   3.3    Wt Readings from Last 3 Encounters:  08/15/18 238 lb (108 kg)  06/15/18 238 lb (108 kg)  03/20/18 236 lb (107 kg)     Objective:    Vital Signs:  BP 136/86   Pulse 81   Ht 5\' 7"  (1.702 m)   Wt 238 lb (108 kg)   BMI 37.28 kg/m    Well nourished, well developed male in no acute distress.  (Brief images via video)   ASSESSMENT & PLAN:    HTN:  This is being managed in the context of treating his CHF  CHF:   I would like to try to increase his meds but I will not be able to go up on the Entresto without being able to check labs.   I will have him check his blood pressure and if this allows as it seems to be a little bit higher today I will try to go up on his carvedilol prior to seeing him back in the office.  TOBACCO ABUSE:   I have continued to encourage him to  He is again encouraged to stop smoking.   SLEEP APNEA:    He continues to be treated for this.  No change in therapy.      COVID-19 Education: The signs and symptoms of COVID-19 were discussed with the patient and how to seek care for testing (follow up with PCP or arrange E-visit).  The importance of social distancing was discussed today.  Time:   Today, I have spent  minutes with the patient with telehealth technology discussing the above problems.     Medication Adjustments/Labs and Tests Ordered: Current medicines are reviewed at length with the patient today.  Concerns regarding medicines are outlined above.  Tests Ordered: No orders of the defined types were placed in this encounter.   Medication Changes: No orders of the defined types were placed in this encounter.    Disposition:  Follow up He has follow up in CHF clinic.   Signed, Rollene Rotunda, MD  08/15/2018 1:40 PM    [facility]

## 2018-08-15 ENCOUNTER — Encounter: Payer: Self-pay | Admitting: Cardiology

## 2018-08-15 ENCOUNTER — Telehealth (INDEPENDENT_AMBULATORY_CARE_PROVIDER_SITE_OTHER): Payer: Self-pay | Admitting: Cardiology

## 2018-08-15 VITALS — BP 136/86 | HR 81 | Ht 67.0 in | Wt 238.0 lb

## 2018-08-15 DIAGNOSIS — E785 Hyperlipidemia, unspecified: Secondary | ICD-10-CM

## 2018-08-15 DIAGNOSIS — G4730 Sleep apnea, unspecified: Secondary | ICD-10-CM

## 2018-08-15 DIAGNOSIS — I5022 Chronic systolic (congestive) heart failure: Secondary | ICD-10-CM

## 2018-08-15 DIAGNOSIS — Z79899 Other long term (current) drug therapy: Secondary | ICD-10-CM

## 2018-08-15 DIAGNOSIS — F17210 Nicotine dependence, cigarettes, uncomplicated: Secondary | ICD-10-CM

## 2018-08-15 DIAGNOSIS — I110 Hypertensive heart disease with heart failure: Secondary | ICD-10-CM

## 2018-08-15 DIAGNOSIS — I429 Cardiomyopathy, unspecified: Secondary | ICD-10-CM

## 2018-08-15 MED ORDER — CARVEDILOL 3.125 MG PO TABS: ORAL_TABLET | ORAL | 3 refills | Status: DC

## 2018-08-15 MED ORDER — SPIRONOLACTONE 25 MG PO TABS: 25.0000 mg | ORAL_TABLET | Freq: Every day | ORAL | 3 refills | Status: DC

## 2018-08-15 MED FILL — CARVEDILOL 3.125 MG TABLET: 3.125 | 90 days supply | Qty: 180 | Fill #0

## 2018-08-15 MED FILL — SPIRONOLACTONE 25 MG TABS: 25 | 90 days supply | Qty: 90 | Fill #0

## 2018-08-15 NOTE — Patient Instructions (Signed)
Medication Instructions:  Continue current medications  If you need a refill on your cardiac medications before your next appointment, please call your pharmacy.  Labwork: Fasting Lipids, BMP and Magnesium in 2 Months HERE IN OUR OFFICE AT [facility]  You will need to fast. DO NOT EAT OR DRINK PAST MIDNIGHT.       Take the provided lab slips with you to the lab for your blood draw.   When you have your labs (blood work) drawn today and your tests are completely normal, you will receive your results only by MyChart Message (if you have MyChart) -OR-  A paper copy in the mail.  If you have any lab test that is abnormal or we need to change your treatment, we will call you to review these results.  Testing/Procedures: None Ordered   Follow-Up: You will need a follow up appointment in 6 months.  Please call our office 2 months in advance to schedule this appointment.  You may see Rollene Rotunda, MD or one of the following Advanced Practice Providers on your designated Care Team:   Theodore Demark, PA-C . Joni Reining, DNP, ANP      At [facility], you and your health needs are our priority.  As part of our continuing mission to provide you with exceptional heart care, we have created designated Provider Care Teams.  These Care Teams include your primary Cardiologist (physician) and Advanced Practice Providers (APPs -  Physician Assistants and Nurse Practitioners) who all work together to provide you with the care you need, when you need it.  Thank you for choosing [facility] at [facility]!!

## 2018-09-04 ENCOUNTER — Ambulatory Visit (HOSPITAL_COMMUNITY)
Admission: RE | Admit: 2018-09-04 | Discharge: 2018-09-04 | Disposition: A | Discharge status: Home / Self Care | Payer: Self-pay | Source: Ambulatory Visit | Attending: Internal Medicine | Admitting: Internal Medicine

## 2018-09-04 ENCOUNTER — Other Ambulatory Visit: Payer: Self-pay

## 2018-11-15 MED FILL — SPIRONOLACTONE 25 MG TABS: 25 | 90 days supply | Qty: 90 | Fill #1

## 2018-11-15 MED FILL — CARVEDILOL 3.125 MG TABLET: 3.125 | 90 days supply | Qty: 180 | Fill #1

## 2018-11-15 MED FILL — CARVEDILOL 12.5 MG TABLET: 12.5 | 90 days supply | Qty: 180 | Fill #1

## 2019-02-07 ENCOUNTER — Other Ambulatory Visit: Payer: Self-pay | Admitting: *Deleted

## 2019-02-07 ENCOUNTER — Telehealth: Payer: Self-pay | Admitting: Cardiology

## 2019-02-07 MED ORDER — ENTRESTO 49-51 MG PO TABS: 1.0000 | ORAL_TABLET | Freq: Two times a day (BID) | ORAL | 0 refills | Status: DC

## 2019-02-07 NOTE — Telephone Encounter (Signed)
Requested Prescriptions   Signed Prescriptions Disp Refills  . sacubitril-valsartan (ENTRESTO) 49-51 MG 180 tablet 0    Sig: Take 1 tablet by mouth 2 (two) times daily.    Authorizing Provider: Minus Breeding    Ordering User: Britt Bottom

## 2019-02-07 NOTE — Telephone Encounter (Signed)
Requested Prescriptions   Signed Prescriptions Disp Refills  . sacubitril-valsartan (ENTRESTO) 49-51 MG 180 tablet 0    Sig: Take 1 tablet by mouth 2 (two) times daily.    Authorizing Provider: HOCHREIN, JAMES    Ordering User: LOPEZ, MARINA C    

## 2019-02-07 NOTE — Telephone Encounter (Signed)
° ° ° °*  STAT* If patient is at the pharmacy, call can be transferred to refill team.   1. Which medications need to be refilled? (please list name of each medication and dose if known) Entresto  2. Which pharmacy/location (including street and city if local pharmacy) is medication to be sent to? [place], [place]  3. Do they need a 30 day or 90 day supply? [place]

## 2019-02-08 ENCOUNTER — Other Ambulatory Visit: Payer: Self-pay

## 2019-02-08 MED ORDER — ENTRESTO 49-51 MG PO TABS: 1.0000 | ORAL_TABLET | Freq: Two times a day (BID) | ORAL | 0 refills | Status: DC

## 2019-02-09 MED ORDER — ENTRESTO 49-51 MG PO TABS: 1.0000 | ORAL_TABLET | Freq: Two times a day (BID) | ORAL | 0 refills | Status: DC

## 2019-02-09 NOTE — Telephone Encounter (Signed)
[facility] called and stated that pt's medication was sent to the wrong pharmacy. I resent pt's medication to the correct pharmacy [facility]. Confirmation received.

## 2019-02-09 NOTE — Addendum Note (Signed)
Addended by: Derl Barrow on: 02/09/2019 12:43 PM   Modules accepted: Orders

## 2019-02-13 ENCOUNTER — Other Ambulatory Visit: Payer: Self-pay

## 2019-02-13 MED ORDER — ENTRESTO 49-51 MG PO TABS: 1.0000 | ORAL_TABLET | Freq: Two times a day (BID) | ORAL | 0 refills | Status: DC

## 2019-02-13 MED FILL — SPIRONOLACTONE 25 MG TABS: 25 | 90 days supply | Qty: 90 | Fill #2

## 2019-03-01 MED FILL — CARVEDILOL 12.5 MG TABLET: 12.5 | 90 days supply | Qty: 180 | Fill #2

## 2019-03-01 MED FILL — CARVEDILOL 3.125 MG TABLET: 3.125 | 90 days supply | Qty: 180 | Fill #2

## 2019-03-05 ENCOUNTER — Telehealth: Payer: Self-pay | Admitting: Cardiology

## 2019-03-05 NOTE — Telephone Encounter (Signed)
Let pt friend know Dr Hochrein's advice per [facility].

## 2019-03-05 NOTE — Telephone Encounter (Signed)
Nope.

## 2019-03-05 NOTE — Telephone Encounter (Signed)
New Message:    Pt is having a tooth extracted or filling tomorrow> He needs to know if he need to be pre-medicated before this please?

## 2019-05-15 MED FILL — SPIRONOLACTONE 25 MG TABS: 25 | 90 days supply | Qty: 90 | Fill #3

## 2019-05-18 ENCOUNTER — Other Ambulatory Visit: Payer: Self-pay | Admitting: Cardiology

## 2019-05-18 MED ORDER — ENTRESTO 49-51 MG PO TABS: 1.0000 | ORAL_TABLET | Freq: Two times a day (BID) | ORAL | 0 refills | Status: DC

## 2019-05-18 NOTE — Telephone Encounter (Signed)
*  STAT* If patient is at the pharmacy, call can be transferred to refill team.   1. Which medications need to be refilled? (please list name of each medication and dose if known) New prescription for Entresto  2. Which pharmacy/location (including street and city if local pharmacy) is medication to be sent to? [facility]  3. Do they need a 30 day or 90 day supply?  90 days and refills

## 2019-05-29 ENCOUNTER — Other Ambulatory Visit: Payer: Self-pay | Admitting: Cardiology

## 2019-05-29 NOTE — Telephone Encounter (Signed)
*  STAT* If patient is at the pharmacy, call can be transferred to refill team.   1. Which medications need to be refilled? (please list name of each medication and dose if known) sacubitril-valsartan (ENTRESTO) 49-51 MG  2. Which pharmacy/location (including street and city if local pharmacy) is medication to be sent to? [facility] [place]   3. Do they need a 30 day or 90 day supply? 90 day

## 2019-06-01 ENCOUNTER — Other Ambulatory Visit: Payer: Self-pay | Admitting: Cardiology

## 2019-06-01 MED ORDER — ENTRESTO 49-51 MG PO TABS: 1.0000 | ORAL_TABLET | Freq: Two times a day (BID) | ORAL | 0 refills | Status: DC

## 2019-06-01 NOTE — Telephone Encounter (Signed)
*  STAT* If patient is at the pharmacy, call can be transferred to refill team.   1. Which medications need to be refilled? (please list name of each medication and dose if known)  sacubitril-valsartan (ENTRESTO) 49-51 MG  2. Which pharmacy/location (including street and city if local pharmacy) is medication to be sent to? [facility] [employer] [facility] - [place], [place] - [address]  3. Do they need a 30 day or 90 day supply? 38  Patient spoke with the pharmacy and says no new rx has been sent yet

## 2019-06-05 ENCOUNTER — Other Ambulatory Visit: Payer: Self-pay | Admitting: Cardiology

## 2019-06-05 MED FILL — CARVEDILOL 12.5 MG TABLET: 12.5 | 30 days supply | Qty: 60 | Fill #0

## 2019-06-05 MED FILL — CARVEDILOL 3.125 MG TABLET: 3.125 | 90 days supply | Qty: 180 | Fill #3

## 2019-07-16 MED FILL — CARVEDILOL 12.5 MG TABLET: 12.5 | 30 days supply | Qty: 60 | Fill #1

## 2019-08-13 ENCOUNTER — Other Ambulatory Visit: Payer: Self-pay | Admitting: Cardiology

## 2019-08-14 MED FILL — CARVEDILOL 12.5 MG TABLET: 12.5 | 30 days supply | Qty: 60 | Fill #0

## 2019-08-14 MED FILL — SPIRONOLACTONE 25 MG TABS: 25 | 30 days supply | Qty: 30 | Fill #0

## 2019-08-24 ENCOUNTER — Emergency Department (HOSPITAL_COMMUNITY): Payer: No Typology Code available for payment source

## 2019-08-24 ENCOUNTER — Emergency Department (HOSPITAL_COMMUNITY)
Admission: EM | Admit: 2019-08-24 | Discharge: 2019-08-24 | Disposition: A | Discharge status: Home / Self Care | Payer: No Typology Code available for payment source | Attending: Emergency Medicine | Admitting: Emergency Medicine

## 2019-08-24 DIAGNOSIS — Y9389 Activity, other specified: Secondary | ICD-10-CM | POA: Diagnosis not present

## 2019-08-24 DIAGNOSIS — I509 Heart failure, unspecified: Secondary | ICD-10-CM | POA: Insufficient documentation

## 2019-08-24 DIAGNOSIS — Y92410 Unspecified street and highway as the place of occurrence of the external cause: Secondary | ICD-10-CM | POA: Insufficient documentation

## 2019-08-24 DIAGNOSIS — S20212A Contusion of left front wall of thorax, initial encounter: Secondary | ICD-10-CM | POA: Diagnosis not present

## 2019-08-24 DIAGNOSIS — W2210XA Striking against or struck by unspecified automobile airbag, initial encounter: Secondary | ICD-10-CM | POA: Insufficient documentation

## 2019-08-24 DIAGNOSIS — Z79899 Other long term (current) drug therapy: Secondary | ICD-10-CM | POA: Diagnosis not present

## 2019-08-24 DIAGNOSIS — F17210 Nicotine dependence, cigarettes, uncomplicated: Secondary | ICD-10-CM | POA: Diagnosis not present

## 2019-08-24 DIAGNOSIS — M25512 Pain in left shoulder: Secondary | ICD-10-CM | POA: Diagnosis not present

## 2019-08-24 DIAGNOSIS — Y998 Other external cause status: Secondary | ICD-10-CM | POA: Insufficient documentation

## 2019-08-24 DIAGNOSIS — S161XXA Strain of muscle, fascia and tendon at neck level, initial encounter: Secondary | ICD-10-CM | POA: Diagnosis not present

## 2019-08-24 DIAGNOSIS — I110 Hypertensive heart disease with heart failure: Secondary | ICD-10-CM | POA: Insufficient documentation

## 2019-08-24 DIAGNOSIS — S199XXA Unspecified injury of neck, initial encounter: Secondary | ICD-10-CM | POA: Diagnosis present
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
## 2019-08-24 MED ORDER — IBUPROFEN 400 MG PO TABS
600.0000 mg | ORAL_TABLET | Freq: Once | ORAL | Status: AC
  Administered 2019-08-24: 600 mg via ORAL
  Filled 2019-08-24: qty 1

## 2019-08-24 MED ORDER — ACETAMINOPHEN 325 MG PO TABS
650.0000 mg | ORAL_TABLET | Freq: Once | ORAL | Status: AC
  Administered 2019-08-24: 20:00:00 650 mg via ORAL
  Filled 2019-08-24: qty 2

## 2019-08-24 NOTE — Progress Notes (Signed)
Orthopedic Tech Progress Note Patient Details:  Cody Carpenter [birth year] [id number]  Ortho Devices Type of Ortho Device: Sling immobilizer Ortho Device/Splint Location: LUE Ortho Device/Splint Interventions: Application   Post Interventions Patient Tolerated: Well Instructions Provided: Care of device, Adjustment of device   [name] E Francine Hannan 08/24/2019, 9:50 PM

## 2019-08-24 NOTE — ED Notes (Signed)
Taken to XRAY

## 2019-08-24 NOTE — ED Notes (Signed)
Pt transported to XR.  
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
## 2019-09-17 ENCOUNTER — Other Ambulatory Visit: Payer: Self-pay | Admitting: Cardiology

## 2019-09-18 MED FILL — SPIRONOLACTONE 25 MG TABS: 25 | 30 days supply | Qty: 30 | Fill #0

## 2019-09-26 ENCOUNTER — Other Ambulatory Visit: Payer: Self-pay | Admitting: Cardiology

## 2019-09-26 MED FILL — CARVEDILOL 3.125 MG TABLET: 3.125 | 15 days supply | Qty: 30 | Fill #0

## 2019-09-26 MED FILL — CARVEDILOL 12.5 MG TABLET: 12.5 | 15 days supply | Qty: 30 | Fill #0

## 2019-09-27 ENCOUNTER — Ambulatory Visit: Payer: Self-pay | Admitting: Cardiology

## 2019-10-02 ENCOUNTER — Telehealth: Payer: Self-pay | Admitting: Cardiology

## 2019-10-02 NOTE — Telephone Encounter (Signed)
New Message  [employer] is sending paperwork to fill out to get reinstated for Entresto. [place] Spaniel it would take a week to get it and he wondered if he could samples till he is able to get it. Wants someone to give him a call back

## 2019-10-02 NOTE — Telephone Encounter (Signed)
[id number]. Will leave sample of entresto 49-51mg  with front desk.

## 2019-10-03 ENCOUNTER — Telehealth: Payer: Self-pay | Admitting: Cardiology

## 2019-10-03 NOTE — Telephone Encounter (Signed)
Returned call to patient he stated he is in the process of renewing patient assistance for [employer].Stated he needs samples.Entresto 49/51 mg samples of 28 tablets left at [employer] office front desk.

## 2019-10-03 NOTE — Telephone Encounter (Signed)
Patient calling the office for samples of medication:   1.  What medication and dosage are you requesting samples for? sacubitril-valsartan (ENTRESTO) 49-51 MG  2.  Are you currently out of this medication? Yes. Patient states he rook the remainder of his medication this morning.

## 2019-10-06 DIAGNOSIS — I5022 Chronic systolic (congestive) heart failure: Secondary | ICD-10-CM | POA: Insufficient documentation
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
## 2019-10-08 ENCOUNTER — Other Ambulatory Visit: Payer: Self-pay

## 2019-10-08 ENCOUNTER — Encounter: Payer: Self-pay | Admitting: Cardiology

## 2019-10-08 ENCOUNTER — Telehealth: Payer: Self-pay | Admitting: Cardiology

## 2019-10-08 ENCOUNTER — Encounter: Payer: Self-pay | Admitting: *Deleted

## 2019-10-08 ENCOUNTER — Ambulatory Visit (INDEPENDENT_AMBULATORY_CARE_PROVIDER_SITE_OTHER): Payer: Self-pay | Admitting: Cardiology

## 2019-10-08 ENCOUNTER — Telehealth: Payer: Self-pay | Admitting: *Deleted

## 2019-10-08 VITALS — BP 121/80 | HR 65 | Temp 97.3°F | Ht 67.0 in | Wt 255.0 lb

## 2019-10-08 DIAGNOSIS — I10 Essential (primary) hypertension: Secondary | ICD-10-CM

## 2019-10-08 DIAGNOSIS — G4730 Sleep apnea, unspecified: Secondary | ICD-10-CM

## 2019-10-08 DIAGNOSIS — Z720 Tobacco use: Secondary | ICD-10-CM

## 2019-10-08 DIAGNOSIS — R0602 Shortness of breath: Secondary | ICD-10-CM

## 2019-10-08 DIAGNOSIS — R002 Palpitations: Secondary | ICD-10-CM

## 2019-10-08 DIAGNOSIS — I5022 Chronic systolic (congestive) heart failure: Secondary | ICD-10-CM

## 2019-10-08 MED ORDER — CARVEDILOL 3.125 MG PO TABS: ORAL_TABLET | ORAL | 3 refills | Status: DC

## 2019-10-08 MED ORDER — ENTRESTO 49-51 MG PO TABS: 1.0000 | ORAL_TABLET | Freq: Two times a day (BID) | ORAL | 0 refills | Status: DC

## 2019-10-08 MED ORDER — SPIRONOLACTONE 25 MG PO TABS: ORAL_TABLET | ORAL | 3 refills | Status: DC

## 2019-10-08 MED ORDER — CARVEDILOL 12.5 MG PO TABS: 12.5000 mg | ORAL_TABLET | Freq: Two times a day (BID) | ORAL | 3 refills | Status: DC

## 2019-10-08 NOTE — Addendum Note (Signed)
Addended by: Teressa Senter on: 10/08/2019 09:50 AM   Modules accepted: Orders

## 2019-10-08 NOTE — Telephone Encounter (Signed)
New Message    Pt is calling and says the monitor will need to be sent to a different address because he has a PO box   Please advise

## 2019-10-08 NOTE — Telephone Encounter (Signed)
Patient enrolled for [employer] to ship a 14 day cardiac event monitor to his home.  Patient given instructions to call [employer] for self pay discounted price of $275.00 if payment arrangements made within 7 days of applying monitor.  Monitor instructions mailed to patient.

## 2019-10-08 NOTE — Telephone Encounter (Signed)
Pt called with address for his monitor to be mailed to him:  [address] [place], [place] [address]  He has a PO BOX in our system.

## 2019-10-08 NOTE — Patient Instructions (Addendum)
Medication Instructions:  NO CHANGES - PRESCRIPTIONS REFILLED FOR A YEAR *If you need a refill on your cardiac medications before your next appointment, please call your pharmacy*  Lab Work: Your physician recommends that you return for lab work (CBC, TSH, BMP) If you have labs (blood work) drawn today and your tests are completely normal, you will receive your results only by: Marland Kitchen MyChart Message (if you have MyChart) OR . A paper copy in the mail If you have any lab test that is abnormal or we need to change your treatment, we will call you to review the results.  Testing/Procedures: Your physician has recommended that you wear an event monitor for 14 days. Event monitors are medical devices that record the heart's electrical activity. Doctors most often [place] these monitors to diagnose arrhythmias. Arrhythmias are problems with the speed or rhythm of the heartbeat. The monitor is a small, portable device. You can wear one while you do your normal daily activities. This is usually used to diagnose what is causing palpitations/syncope (passing out).  Follow-Up: At [facility], you and your health needs are our priority.  As part of our continuing mission to provide you with exceptional heart care, we have created designated Provider Care Teams.  These Care Teams include your primary Cardiologist (physician) and Advanced Practice Providers (APPs -  Physician Assistants and Nurse Practitioners) who all work together to provide you with the care you need, when you need it.  Your next appointment:   6 month(s)  You will receive a reminder letter in the mail two months in advance. If you don't receive a letter, please call our office to schedule the follow-up appointment.  The format for your next appointment:   In Person  Provider:   Rollene Rotunda, MD  Other Instructions Referred to Dr. Tresa Endo for sleep  [employer] Cardiac Event Monitor Instructions Your physician has requested you wear your  cardiac event monitor for 14 days. [employer] may call or text to confirm a shipping address. The monitor will be sent to a land address via UPS. [employer] will not ship a monitor to a PO BOX. It typically takes 3-5 days to receive your monitor after it has been enrolled. [employer] will assist with USPS tracking if your package is delayed. The telephone number for [employer] is [phone]. Once you have received your monitor, please review the enclosed instructions. Instruction tutorials can also be viewed under help and settings on the enclosed cell phone. Your monitor has already been registered assigning a specific monitor serial # to you.  Applying the monitor Remove cell phone from case and turn it on. The cell phone works as IT consultant and needs to be within [employer] of you at all times. The cell phone will need to be charged on a daily basis. We recommend you plug the cell phone into the enclosed charger at your bedside table every night.  Monitor batteries: You will receive two monitor batteries labelled #1 and #2. These are your recorders. Plug battery #2 onto the second connection on the enclosed charger. Keep one battery on the charger at all times. This will keep the monitor battery deactivated. It will also keep it fully charged for when you need to switch your monitor batteries. A small light will be blinking on the battery emblem when it is charging. The light on the battery emblem will remain on when the battery is fully charged.  Open package of a Monitor strip. Insert battery #1 into black hood  on strip and gently squeeze monitor battery onto connection as indicated in instruction booklet. Set aside while preparing skin.  Choose location for your strip, vertical or horizontal, as indicated in the instruction booklet. Shave to remove all hair from location. There cannot be any lotions, oils, powders, or colognes on skin where monitor is to be applied. Wipe skin  clean with enclosed Saline wipe. Dry skin completely.  Peel paper labeled #1 off the back of the Monitor strip exposing the adhesive. Place the monitor on the chest in the vertical or horizontal position shown in the instruction booklet. One arrow on the monitor strip must be pointing upward. Carefully remove paper labeled #2, attaching remainder of strip to your skin. Try not to create any folds or wrinkles in the strip as you apply it.  Firmly press and release the circle in the center of the monitor battery. You will hear a small beep. This is turning the monitor battery on. The heart emblem on the monitor battery will light up every 5 seconds if the monitor battery in turned on and connected to the patient securely. Do not push and hold the circle down as this turns the monitor battery off. The cell phone will locate the monitor battery. A screen will appear on the cell phone checking the connection of your monitor strip. This may read poor connection initially but change to good connection within the next minute. Once your monitor accepts the connection you will hear a series of 3 beeps followed by a climbing crescendo of beeps. A screen will appear on the cell phone showing the two monitor strip placement options. Touch the picture that demonstrates where you applied the monitor strip.  Your monitor strip and battery are waterproof. You are able to shower, bathe, or swim with the monitor on. They just ask you do not submerge deeper than 3 feet underwater. We recommend removing the monitor if you are swimming in a lake, river, or ocean.  Your monitor battery will need to be switched to a fully charged monitor battery approximately once a week. The cell phone will alert you of an action which needs to be made.  On the cell phone, tap for details to reveal connection status, monitor battery status, and cell phone battery status. The green dots indicates your monitor is in good status. A  red dot indicates there is something that needs your attention.  To record a symptom, click the circle on the monitor battery. In 30-60 seconds a list of symptoms will appear on the cell phone. Select your symptom and tap save. Your monitor will record a sustained or significant arrhythmia regardless of you clicking the button. Some patients do not feel the heart rhythm irregularities. [employer] will notify us of any serious or critical events.  Refer to instruction booklet for instructions on switching batteries, changing strips, the Do not disturb or Pause features, or any additional questions.  Call [employer] at [phone], to confirm your monitor is transmitting and record your baseline. They will answer any questions you may have regarding the monitor instructions at that time.  Returning the monitor to [place] all equipment back into blue box. Peel off strip of paper to expose adhesive and close box securely. There is a prepaid UPS shipping label on this box. Drop in a UPS drop box, or at a UPS facility like [employer]. You may also contact [employer] to arrange UPS to pick up monitor package at your home.

## 2019-10-09 LAB — BASIC METABOLIC PANEL
BUN/Creatinine Ratio: 20 (ref 9–20)
BUN: 19 mg/dL (ref 6–24)
CO2: 21 mmol/L (ref 20–29)
Calcium: 9.3 mg/dL (ref 8.7–10.2)
Chloride: 103 mmol/L (ref 96–106)
Creatinine, Ser: 0.94 mg/dL (ref 0.76–1.27)
GFR calc Af Amer: 107 mL/min/{1.73_m2} (ref >59)
GFR calc non Af Amer: 93 mL/min/{1.73_m2} (ref >59)
Glucose: 96 mg/dL (ref 65–99)
Potassium: 4.6 mmol/L (ref 3.5–5.2)
Sodium: 139 mmol/L (ref 134–144)

## 2019-10-09 LAB — CBC
Hematocrit: 42 % (ref 37.5–51.0)
Hemoglobin: 13.8 g/dL (ref 13.0–17.7)
MCH: 30.7 pg (ref 26.6–33.0)
MCHC: 32.9 g/dL (ref 31.5–35.7)
MCV: 93 fL (ref 79–97)
Platelets: 167 10*3/uL (ref 150–450)
RBC: 4.5 x10E6/uL (ref 4.14–5.80)
RDW: 13.3 % (ref 11.6–15.4)
WBC: 6.7 10*3/uL (ref 3.4–10.8)

## 2019-10-09 LAB — TSH: TSH: 1.61 u[IU]/mL (ref 0.450–4.500)

## 2019-10-10 ENCOUNTER — Telehealth: Payer: Self-pay

## 2019-10-10 NOTE — Telephone Encounter (Signed)
[employer] application faxed for Entresto assistance 

## 2019-10-17 ENCOUNTER — Other Ambulatory Visit: Payer: Self-pay | Admitting: Orthopaedic Surgery

## 2019-10-18 ENCOUNTER — Other Ambulatory Visit: Payer: Self-pay

## 2019-10-18 ENCOUNTER — Telehealth: Payer: Self-pay

## 2019-10-18 ENCOUNTER — Other Ambulatory Visit: Payer: Self-pay | Admitting: Orthopaedic Surgery

## 2019-10-18 ENCOUNTER — Other Ambulatory Visit (HOSPITAL_COMMUNITY): Payer: Self-pay | Admitting: Cardiology

## 2019-10-18 DIAGNOSIS — M5020 Other cervical disc displacement, unspecified cervical region: Secondary | ICD-10-CM

## 2019-10-18 DIAGNOSIS — M25511 Pain in right shoulder: Secondary | ICD-10-CM

## 2019-10-18 DIAGNOSIS — M75100 Unspecified rotator cuff tear or rupture of unspecified shoulder, not specified as traumatic: Secondary | ICD-10-CM

## 2019-10-18 MED ORDER — SPIRONOLACTONE 25 MG PO TABS: ORAL_TABLET | ORAL | 3 refills | Status: DC

## 2019-10-18 MED ORDER — ENTRESTO 49-51 MG PO TABS: 1.0000 | ORAL_TABLET | Freq: Two times a day (BID) | ORAL | 0 refills | Status: DC

## 2019-10-18 MED ORDER — CARVEDILOL 3.125 MG PO TABS: ORAL_TABLET | ORAL | 3 refills | Status: DC

## 2019-10-18 MED ORDER — CARVEDILOL 12.5 MG PO TABS: 12.5000 mg | ORAL_TABLET | Freq: Two times a day (BID) | ORAL | 3 refills | Status: DC

## 2019-10-18 MED FILL — SPIRONOLACTONE 25 MG TABS: 25 | 90 days supply | Qty: 90 | Fill #0

## 2019-10-18 MED FILL — CARVEDILOL 12.5 MG TABLET: 12.5 | 90 days supply | Qty: 180 | Fill #0

## 2019-10-18 MED FILL — CARVEDILOL 3.125 MG TABLET: 3.125 | 90 days supply | Qty: 180 | Fill #0

## 2019-10-18 NOTE — Telephone Encounter (Signed)
Left a message for patient to give the office a call back.  ?

## 2019-10-18 NOTE — Telephone Encounter (Signed)
Follow up   Pt is returning call   Please call  

## 2019-10-21 ENCOUNTER — Ambulatory Visit (INDEPENDENT_AMBULATORY_CARE_PROVIDER_SITE_OTHER): Payer: Self-pay

## 2019-10-21 DIAGNOSIS — R002 Palpitations: Secondary | ICD-10-CM

## 2019-10-21 DIAGNOSIS — I421 Obstructive hypertrophic cardiomyopathy: Secondary | ICD-10-CM

## 2019-11-21 ENCOUNTER — Other Ambulatory Visit: Payer: Self-pay | Admitting: Cardiology

## 2019-11-21 DIAGNOSIS — R002 Palpitations: Secondary | ICD-10-CM

## 2019-11-21 DIAGNOSIS — I421 Obstructive hypertrophic cardiomyopathy: Secondary | ICD-10-CM

## 2019-11-24 ENCOUNTER — Other Ambulatory Visit: Payer: Self-pay

## 2019-12-07 ENCOUNTER — Telehealth: Payer: Self-pay | Admitting: Cardiology

## 2019-12-07 NOTE — Telephone Encounter (Signed)
Patient is returning call to discuss monitor results. 

## 2019-12-07 NOTE — Telephone Encounter (Signed)
Pt advised that Dr. Antoine Poche would like to schedule an appointment to discuss monitor report. Pt agreeable to plan and appointment scheduled for 8/16 at 8 am.

## 2019-12-15 DIAGNOSIS — I4729 Other ventricular tachycardia: Secondary | ICD-10-CM | POA: Insufficient documentation

## 2019-12-15 DIAGNOSIS — I472 Ventricular tachycardia: Secondary | ICD-10-CM | POA: Insufficient documentation

## 2019-12-15 NOTE — Progress Notes (Signed)
Cardiology Office Note   Date:  12/18/2019   ID:  Cody Carpenter, DOB 07-22-1965, MRN [id number]  PCP:  Patient, No Pcp Per  Cardiologist:   Rollene Rotunda, MD   Chief Complaint  Patient presents with  . Palpitations      History of Present Illness: Cody Carpenter is a 54 y.o. 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  His EF had been 20% in 2008 but improved with medical management. Unfortunately he eventually stopped his meds and his EF went from a high of 55% to 25% in 2015 and after med titration only went up to 30% in 2016.  However, in August his EF was 20 - 25%.   I have had a difficult time titrating his meds because of hypotension.  I did send him for a CPX recently.  He had a submax effort.  Peak VO2 was 17.5.    He was having palpitations at the last appt and I ordered a monitor which demonstrated frequent ventricular ectopy and NSVT.    He reports he is feeling the palpitations more.  He gets weak with these.  He has not had any frank syncope.  He does not have any presyncope.  He says the palpitations are getting more frequent and happening every day.  He does claim that he has shortness of breath climbing a flight of stairs (class II) but he is not having PND or orthopnea.  He is lost weight intentionally.  He is not having any chest pressure, neck or arm discomfort.  He has had no edema.   Past Medical History:  Diagnosis Date  . CHF (congestive heart failure) (HCC)   . Hypertension   . Sleep apnea    CPAP    Past Surgical History:  Procedure Laterality Date  . NO PAST SURGERIES       Current Outpatient Medications  Medication Sig Dispense Refill  . carvedilol (COREG) 12.5 MG tablet Take 1 tablet (12.5 mg total) by mouth 2 (two) times daily with a meal. 180 tablet 3  . sacubitril-valsartan (ENTRESTO) 49-51 MG Take 1 tablet by mouth 2 (two) times daily. Please make annual appt in April for refills. Thank you 180  tablet 0  . spironolactone (ALDACTONE) 25 MG tablet TAKE 1 TABLET BY MOUTH AT BEDTIME. 90 tablet 3  . carvedilol (COREG) 6.25 MG tablet Take 1 tablet (6.25 mg total) by mouth 2 (two) times daily. Take with the 12.5mg  tablets 180 tablet 3   No current facility-administered medications for this visit.    Allergies:   Patient has no known allergies.    ROS:  Please see the history of present illness.   Otherwise, review of systems are positive for none..   All other systems are reviewed and negative.    PHYSICAL EXAM: VS:  BP 108/62   Pulse 70   Ht 5\' 7"  (1.702 m)   Wt 240 lb (108.9 kg)   BMI 37.59 kg/m  , BMI Body mass index is 37.59 kg/m. GENERAL:  Well appearing NECK:  No jugular venous distention, waveform within normal limits, carotid upstroke brisk and symmetric, no bruits, no thyromegaly LUNGS:  Clear to auscultation bilaterally CHEST:  Unremarkable HEART:  PMI not displaced or sustained,S1 and S2 within normal limits, no S3, no S4, no clicks, no rubs, no murmurs ABD:  Flat, positive bowel sounds normal in frequency in pitch, no bruits, no rebound, no guarding, no  midline pulsatile mass, no hepatomegaly, no splenomegaly EXT:  2 plus pulses throughout, no edema, no cyanosis no clubbing    EKG:  EKG is not ordered today.   Recent Labs: 10/08/2019: Hemoglobin 13.8; Platelets 167; TSH 1.610 12/17/2019: BUN 16; Creatinine, Ser 0.99; Magnesium 2.0; Potassium 4.6; Sodium 140    Lipid Panel    Component Value Date/Time   CHOL  03/02/2007 0838    170        ATP III CLASSIFICATION:  <200     mg/dL   Desirable  876-811  mg/dL   Borderline High  >=572    mg/dL   High   TRIG 620 (H) 35/59/7416 0838   HDL 26 (L) 03/02/2007 0838   CHOLHDL 6.5 03/02/2007 0838   VLDL 58 (H) 03/02/2007 0838   LDLCALC  03/02/2007 0838    86        Total Cholesterol/HDL:CHD Risk Coronary Heart Disease Risk Table                     Men   Women  1/2 Average Risk   3.4   3.3      Wt Readings  from Last 3 Encounters:  12/17/19 240 lb (108.9 kg)  10/08/19 255 lb (115.7 kg)  08/24/19 250 lb (113.4 kg)      Other studies Reviewed: Additional studies/ records that were reviewed today include: Labs. Review of the above records demonstrates: See elsewhere  ASSESSMENT AND PLAN:     CHF: Unfortunately he has not had insurance and so he has never been able to afford and will not consent to further work-up to include MRI work-up cardiac cath.  He has been seen by advanced heart failure clinic.  And CPX which was reasonable and he does not considered for advanced therapies.  He is felt to have a nonischemic cardiomyopathy.  He is letting me do more med titration today and I can increase to 18.75 mg twice daily of carvedilol.  I am going to check a basic metabolic profile and magnesium.  I am going to repeat an echo as its been 2 years.  He has class II symptoms and should be considered for ICD.  Surprisingly today he consents to this.    PVCs:  The patient had frequent ventricular ectopy and does complain of palpitations.  This will be managed as above.  HTN:   This is being managed in the context of treating his CHF  TOBACCO ABUSE: He is not able to quit smoking.  SLEEP APNEA: He was to be referred to Dr. Tresa Endo.  He has an appointment on August 30.  PALPITATIONS:   He had significant ventricular ectopy on a monitor recently.  As above.  COVID EDUCATION:    We again talked about the vaccine.  He still does not want to consider this as he knows several old people who died after taking it.   Current medicines are reviewed at length with the patient today.  The patient does not have concerns regarding medicines.  The following changes have been made:   As above  Labs/ tests ordered today include:   Orders Placed This Encounter  Procedures  . Basic metabolic panel  . Magnesium  . Ambulatory referral to Cardiac Electrophysiology  . ECHOCARDIOGRAM COMPLETE      Disposition:   FU with me in 1 month.      Signed, Rollene Rotunda, MD  12/18/2019 6:14 PM    [facility]

## 2019-12-17 ENCOUNTER — Other Ambulatory Visit: Payer: Self-pay

## 2019-12-17 ENCOUNTER — Encounter: Payer: Self-pay | Admitting: Cardiology

## 2019-12-17 ENCOUNTER — Ambulatory Visit (INDEPENDENT_AMBULATORY_CARE_PROVIDER_SITE_OTHER): Payer: Self-pay | Admitting: Cardiology

## 2019-12-17 VITALS — BP 108/62 | HR 70 | Ht 67.0 in | Wt 240.0 lb

## 2019-12-17 DIAGNOSIS — I5022 Chronic systolic (congestive) heart failure: Secondary | ICD-10-CM

## 2019-12-17 DIAGNOSIS — I472 Ventricular tachycardia: Secondary | ICD-10-CM

## 2019-12-17 DIAGNOSIS — I4729 Other ventricular tachycardia: Secondary | ICD-10-CM

## 2019-12-17 DIAGNOSIS — Z720 Tobacco use: Secondary | ICD-10-CM

## 2019-12-17 LAB — BASIC METABOLIC PANEL
BUN/Creatinine Ratio: 16 (ref 9–20)
BUN: 16 mg/dL (ref 6–24)
CO2: 22 mmol/L (ref 20–29)
Calcium: 9.5 mg/dL (ref 8.7–10.2)
Chloride: 104 mmol/L (ref 96–106)
Creatinine, Ser: 0.99 mg/dL (ref 0.76–1.27)
GFR calc Af Amer: 101 mL/min/{1.73_m2} (ref >59)
GFR calc non Af Amer: 87 mL/min/{1.73_m2} (ref >59)
Glucose: 92 mg/dL (ref 65–99)
Potassium: 4.6 mmol/L (ref 3.5–5.2)
Sodium: 140 mmol/L (ref 134–144)

## 2019-12-17 LAB — MAGNESIUM: Magnesium: 2 mg/dL (ref 1.6–2.3)

## 2019-12-17 MED ORDER — CARVEDILOL 6.25 MG PO TABS
6.2500 mg | ORAL_TABLET | Freq: Two times a day (BID) | ORAL | 3 refills | Status: DC
Start: 2019-12-17 — End: 2020-01-31

## 2019-12-17 MED FILL — CARVEDILOL 6.25 MG TABLET: 6.25 | 90 days supply | Qty: 180 | Fill #0

## 2019-12-17 NOTE — Patient Instructions (Signed)
Medication Instructions:  Increase Carvedilol - you may take 2 of the 3.125mg  tablets twice a day until they are gone and then begin the 6.25mg  tablets twice a day along with the carvedilol 12.5mg  tablets twice a day *If you need a refill on your cardiac medications before your next appointment, please call your pharmacy*  Lab Work: Your physician recommends that you return for lab work today: BMP, Mag If you have labs (blood work) drawn today and your tests are completely normal, you will receive your results only by: Marland Kitchen MyChart Message (if you have MyChart) OR . A paper copy in the mail If you have any lab test that is abnormal or we need to change your treatment, we will call you to review the results.  Testing/Procedures: Your physician has requested that you have an echocardiogram. Echocardiography is a painless test that uses sound waves to create images of your heart. It provides your doctor with information about the size and shape of your heart and how well your heart's chambers and valves are working. This procedure takes approximately one hour. There are no restrictions for this procedure.  Follow-Up: At [facility], you and your health needs are our priority.  As part of our continuing mission to provide you with exceptional heart care, we have created designated Provider Care Teams.  These Care Teams include your primary Cardiologist (physician) and Advanced Practice Providers (APPs -  Physician Assistants and Nurse Practitioners) who all work together to provide you with the care you need, when you need it.  Your next appointment:   1 month(s) after EP appointment  The format for your next appointment:   In Person  Provider:   Rollene Rotunda, MD  Other Instructions Ambulatory Referral to EP

## 2019-12-18 ENCOUNTER — Encounter: Payer: Self-pay | Admitting: Cardiology

## 2019-12-21 ENCOUNTER — Ambulatory Visit
Admission: RE | Admit: 2019-12-21 | Discharge: 2019-12-21 | Disposition: A | Discharge status: Home / Self Care | Payer: Self-pay | Source: Ambulatory Visit | Attending: Orthopaedic Surgery | Admitting: Orthopaedic Surgery

## 2019-12-21 DIAGNOSIS — M75100 Unspecified rotator cuff tear or rupture of unspecified shoulder, not specified as traumatic: Secondary | ICD-10-CM

## 2019-12-21 DIAGNOSIS — M5020 Other cervical disc displacement, unspecified cervical region: Secondary | ICD-10-CM

## 2019-12-21 DIAGNOSIS — M25511 Pain in right shoulder: Secondary | ICD-10-CM
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
## 2019-12-21 IMAGING — MR MR LUMBAR SPINE W/O CM
4 of 5 series · 25 of 48 positions shown · non-contrast
Comparison: MRI shoulder same day.  CT cervical spine [DATE].

CLINICAL DATA: Low back pain radiating to the right leg.

EXAM:
MRI LUMBAR SPINE WITHOUT CONTRAST
TECHNIQUE: Multiplanar, multisequence MR imaging of the lumbar spine was
performed. No intravenous contrast was administered.

[Series 3: T2 post-contrast · sagittal · 4.0mm · 0.53mm/px · 6 of 16 slices shown]
[im 1/16]
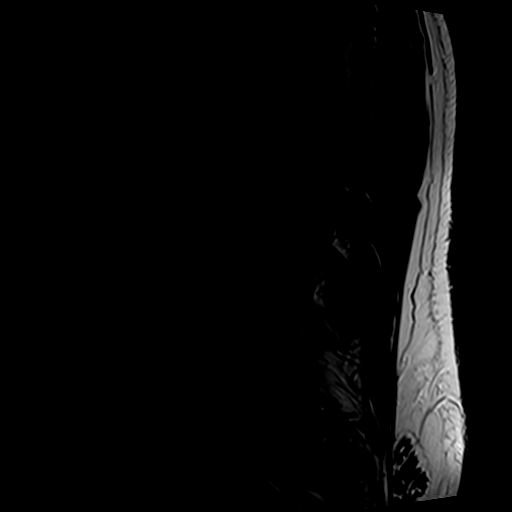
[im 4/16]
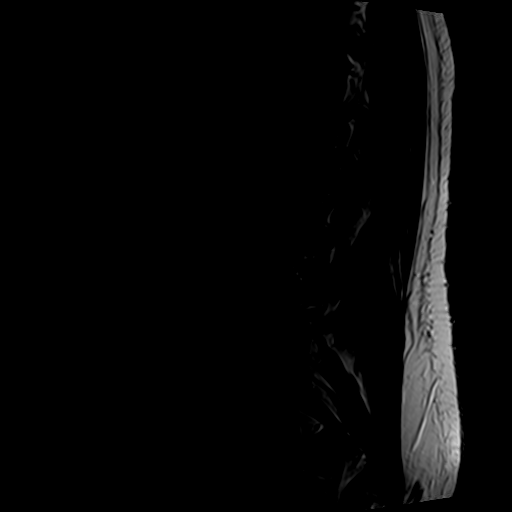
[im 7/16]
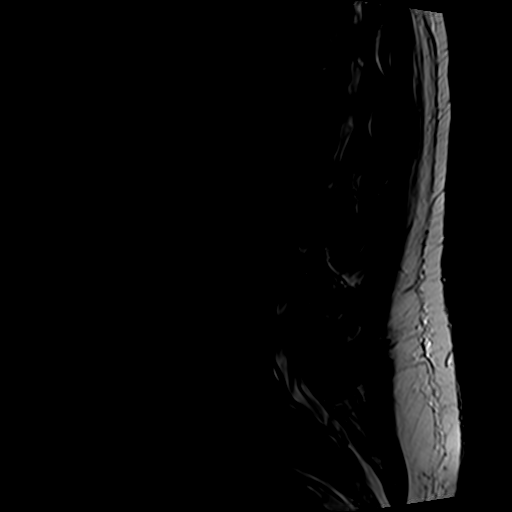
[im 10/16]
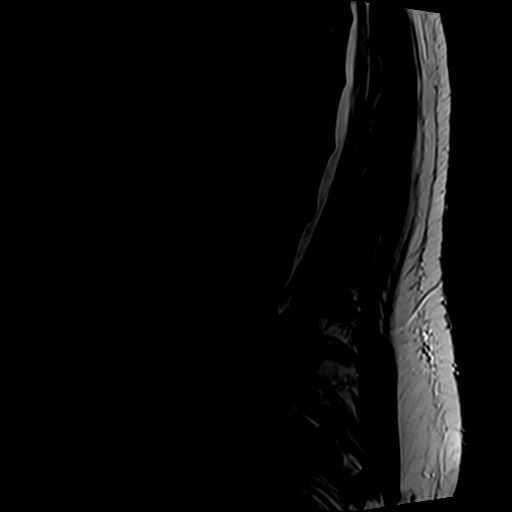
[im 13/16]
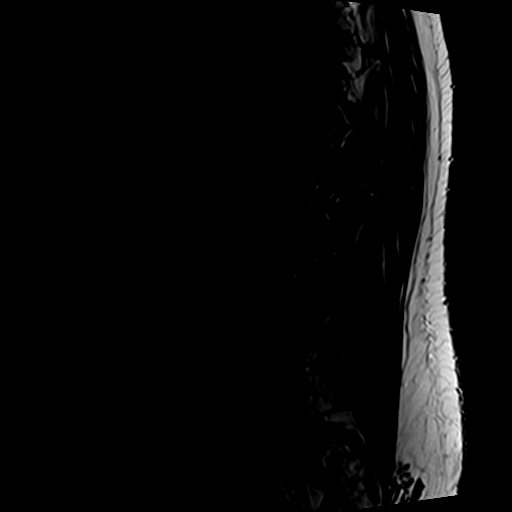
[im 16/16]
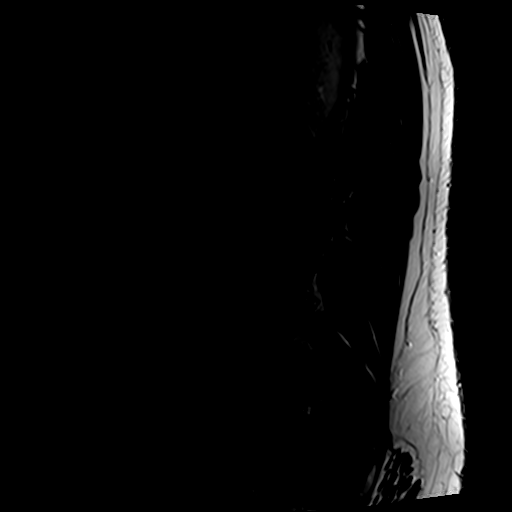

[Series 5: T1 · sagittal · 4.0mm · 0.53mm/px · 6 of 16 slices shown (1 of 2)]
[im 1/16]
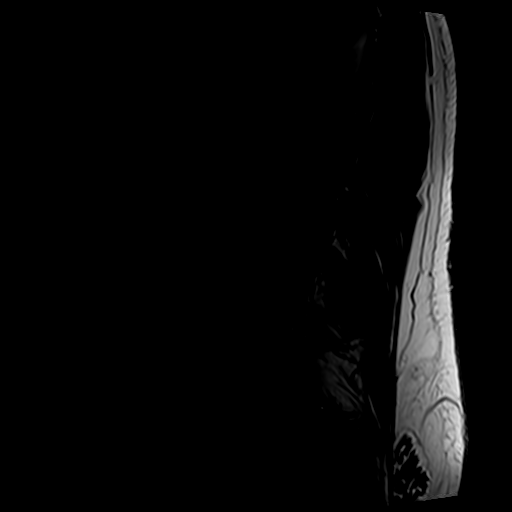
[im 4/16]
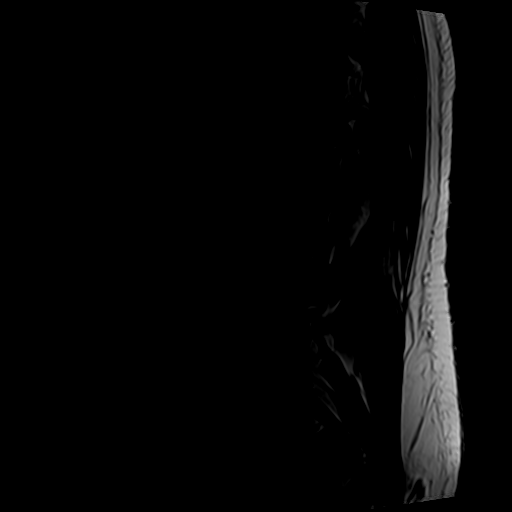
[im 7/16]
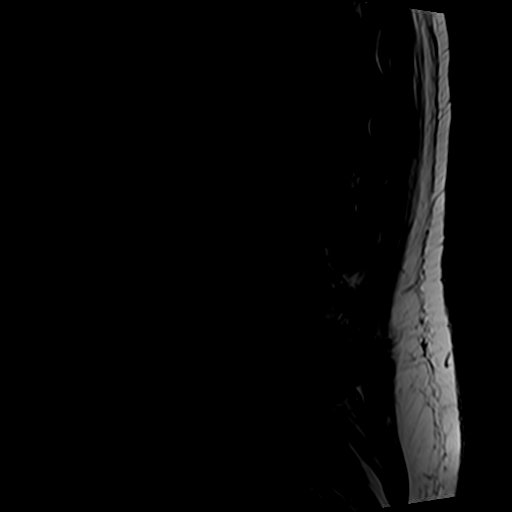
[im 10/16]
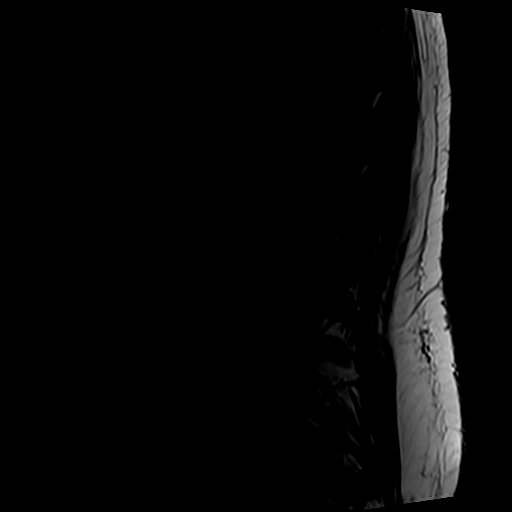
[im 13/16]
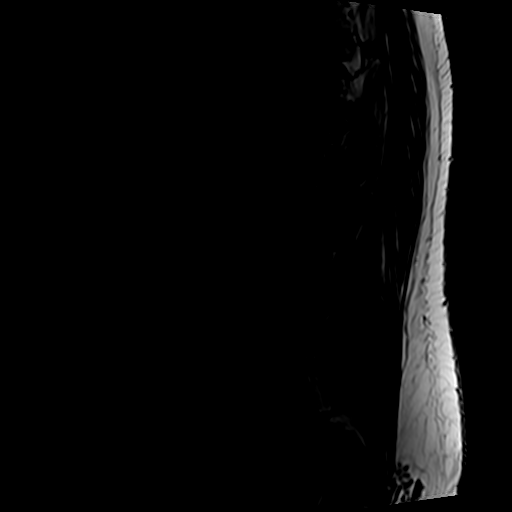
[im 16/16]
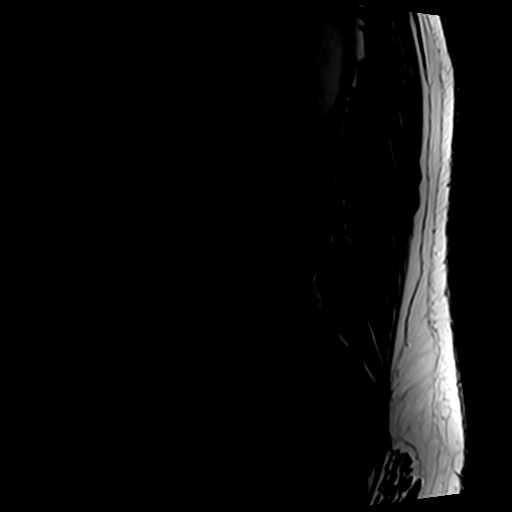

[Series 6: T2 · axial · 4.0mm · 0.70mm/px · z∈[-96,+122]mm · 9 of 40 slices shown]
[im 1/40]
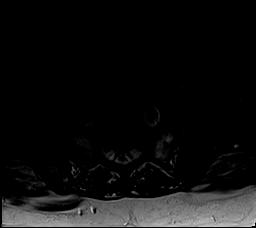
[im 6/40]
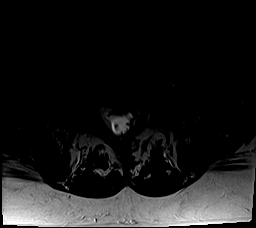
[im 12/40]
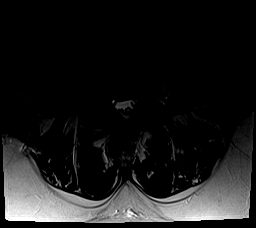
[im 17/40]
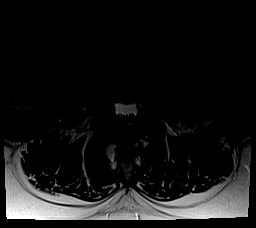
[im 20/40]
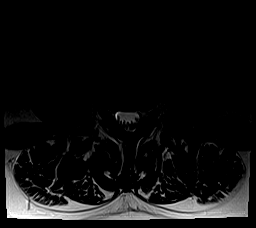
[im 23/40]
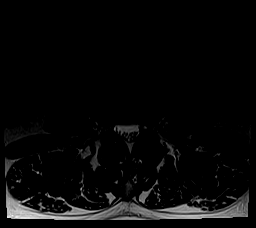
[im 28/40]
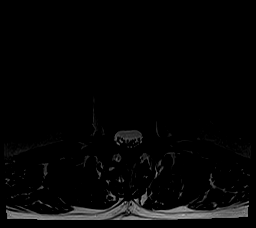
[im 34/40]
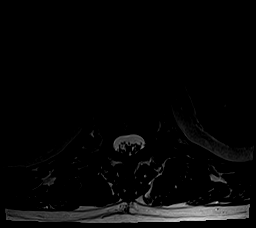
[im 40/40]
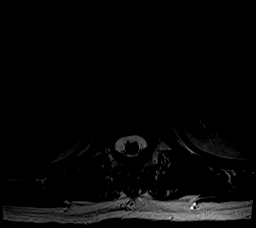

[Series 7: T1 · axial · 4.0mm · 0.35mm/px · z∈[-96,+91]mm · 4 of 40 slices shown (2 of 2)]
[im 1/40]
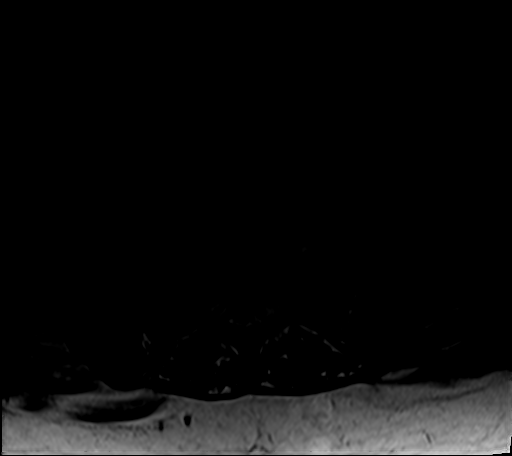
[im 6/40]
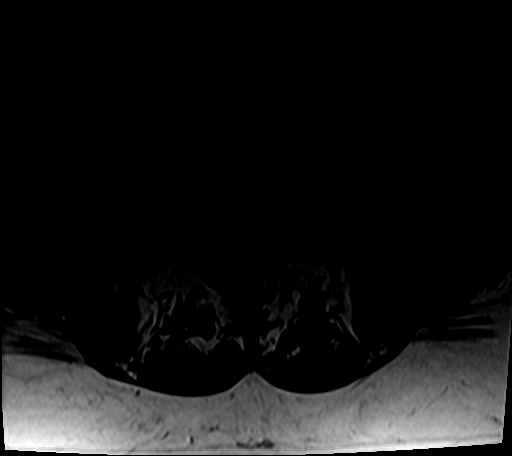
[im 20/40]
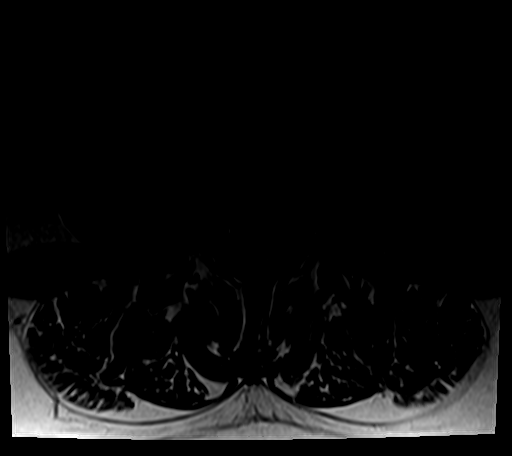
[im 34/40]
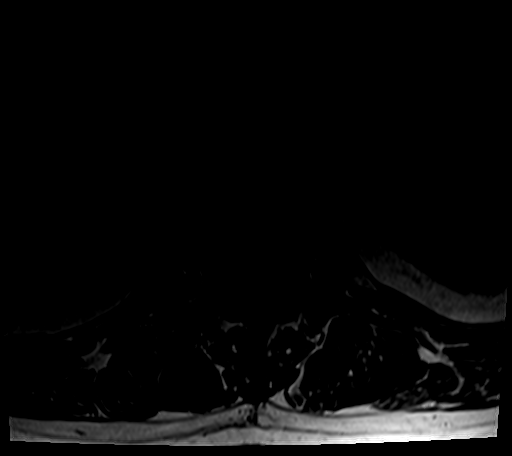

[25 of 48 positions shown; findings below may reference images not displayed]

FINDINGS: Segmentation:  5 lumbar type vertebral bodies assumed.

Alignment:  Normal

Vertebrae: Unusual marrow pattern with fatty change in the endplate
regions, multiple endplate Schmorl's nodes, and multiple areas
abnormal marrow edema, largely in the endplate regions. I think this
pattern is probably due 2 discogenic edematous change and could be
associated with some sort of seronegative spondyloarthropathy. There
also appears to be some edema in the left sacroiliac region. The
appearance is not typical of metastatic disease or myeloma. Certain
lymphomas can result in unusual marrow pattern slight this, but that
is not favored.

Conus medullaris and cauda equina: Conus extends to the L1 level.
Conus and cauda equina appear normal.

Paraspinal and other soft tissues: Negative

Disc levels:

Mild, non-compressive disc bulges from T11-12 through L3-4.

L4-5: Bulging of the disc more prominent towards the right. Facet
and ligamentous hypertrophy. Mild stenosis of the lateral recesses
right more than left but no distinct neural compression.

L5-S1: Bulging of the disc with a shallow protrusion in the left
posterolateral direction. This approaches the thecal sac in the left
S1 nerve but does not appear to cause distinct neural compression.
There is mild facet osteoarthritis. L5 nerves appear to exit without
compression.
IMPRESSION: Abnormal and unusual marrow pattern as described above. Favored
diagnosis relates to degenerative disc disease, endplate Schmorl's
nodes and possibly seronegative spondyloarthropathy. Some edema also
noted associated with the left sacroiliac joint. Certainly, the
findings could be associated with generalized back pain.

No definite neural compressive pathology. Disc bulges and facet
osteoarthritis as described above. At L4-5, there is narrowing of
the lateral recesses right more than left, but no distinct
compression of the right L5 nerve. At L5-S1, disc material
protruding towards the left contacts the thecal sac in the left S1
root sleeve, but neural compression is not seen.

## 2019-12-26 ENCOUNTER — Other Ambulatory Visit (HOSPITAL_COMMUNITY): Payer: Self-pay

## 2019-12-30 NOTE — Progress Notes (Deleted)
Electrophysiology Office Note:    Date:  12/30/2019   ID:  Cody Carpenter, DOB 04-Apr-1966, MRN [id number]  PCP:  Patient, No Pcp Per  [facility] Cardiologist:  Rollene Rotunda, MD  [facility] Electrophysiologist:  Lanier Prude, MD   Referring MD: Rollene Rotunda, MD   Chief Complaint: NICM EF 20%, NYHA Class II  History of Present Illness:    Cody Carpenter is a 54 y.o. male with a hx of NICM EF 20% who presents to the clinic to discuss primary prevention ICD. His medical history also includes active tobacco abuse and HTN.  His diagnosis of NICM dates back to 2008 but initially improved with medical therapy. He subsequently stopped his medications and his EF dropped again to 25%. After restarting his medical therapy, his EF stayed stable in the 20-25% range. He is also having frequent ventricular ectopy and NSVT which is symptomatic.   Past Medical History:  Diagnosis Date  . CHF (congestive heart failure) (HCC)   . Hypertension   . Sleep apnea    CPAP    Past Surgical History:  Procedure Laterality Date  . NO PAST SURGERIES      Current Medications: No outpatient medications have been marked as taking for the 12/31/19 encounter (Appointment) with Lanier Prude, MD.     Allergies:   Patient has no known allergies.   Social History   Socioeconomic History  . Marital status: Single    Spouse name: Not on file  . Number of children: 3  . Years of education: Not on file  . Highest education level: Not on file  Occupational History  . Occupation: roofer  Tobacco Use  . Smoking status: Current Every Day Smoker    Packs/day: 1.00    Years: 25.00    Pack years: 25.00    Types: Cigarettes  . Smokeless tobacco: Never Used  Substance and Sexual Activity  . Alcohol use: Yes  . Drug use: No  . Sexual activity: Not on file  Other Topics Concern  . Not on file  Social History Narrative  . Not on file   Social Determinants of Health   Financial Resource  Strain:   . Difficulty of Paying Living Expenses: Not on file  Food Insecurity:   . Worried About Programme researcher, broadcasting/film/video in the Last Year: Not on file  . Ran Out of Food in the Last Year: Not on file  Transportation Needs:   . Lack of Transportation (Medical): Not on file  . Lack of Transportation (Non-Medical): Not on file  Physical Activity:   . Days of Exercise per Week: Not on file  . Minutes of Exercise per Session: Not on file  Stress:   . Feeling of Stress : Not on file  Social Connections:   . Frequency of Communication with Friends and Family: Not on file  . Frequency of Social Gatherings with Friends and Family: Not on file  . Attends Religious Services: Not on file  . Active Member of Clubs or Organizations: Not on file  . Attends Banker Meetings: Not on file  . Marital Status: Not on file     Family History: The patient's family history includes Heart disease in his maternal grandfather; Lung cancer in his maternal grandmother.  ROS:   Please see the history of present illness.    All other systems reviewed and are negative.  EKGs/Labs/Other Studies Reviewed:    The following studies were reviewed today: Echo,  CPX, [employer] holter monitor  12/28/2017 Echo - Left ventricle: The cavity size was severely dilated. Systolic  function was severely reduced. The estimated ejection fraction  was in the range of 20% to 25%. Wall motion was normal; there  were no regional wall motion abnormalities. Doppler parameters  are consistent with a reversible restrictive pattern, indicative  of decreased left ventricular diastolic compliance and/or  increased left atrial pressure (grade 3 diastolic dysfunction).  - Left atrium: The atrium was moderately dilated. Volume/bsa, ES,  (1-plane Simpson&'s, A2C): 48.4 ml/m^2.  - Right ventricle: The cavity size was mildly dilated. Wall  thickness was normal.   01/19/2018 CPX ECG: Resting ECG in sinus rhythm  with PACs, left axis deviation and LBBB. HR response blunted (consider submaximal exercise). There were PVCs with no sustained arrhythmias and no ST-T changes. BP response normal.  PFT: Pre-exercise spirometry demonstrates moderate restrictive properties. The MVV was normal.  CPX: Exercise testing with gas exchange demonstrates a moderately reduced peak VO2 of 17.5 ml/kg/min (67% of the age/gender/weight matched sedentary norms). The RER of 0.89 indicates a submaximal effort. When adjusted to the patient's ideal body weight of 162.6 lb (73.7 kg) the peak VO2 is 26.0 ml/kg (ibw)/min (73% of the ibw-adjusted predicted). The VE/VCO2 slope is mildly elevated and indicates increased dead space ventilation. The oxygen uptake efficiency slope (OUES) is normal. The VO2 at the ventilatory threshold was normal at 59% of the predicted peak VO2 and 89% measured PVO2. At peak exercise, the ventilation reached 63% of the measured MVV indicating ventilatory reserve remained. The O2pulse (a surrogate for stroke volume) increased with incremental exercise, reaching peak of 17 ml/beat (100% predicted)  Conclusion: The interpretation of this test is limited due to submaximal effort during the exercise. Based on available data, exercise testing with gas exchange demonstrates moderate functional capacity when compared to matched sedentary norms. There is no clear cardiopulmonary limitation. At peak exercise, patient appears limited due to body habitus with related restrictive lung properties.   10/08/2019 ECG Sinus rhythm with a LBBB. There are monomorphic PVCs with a superiorly directed axis appearing to arise from the inferior wall of the LV.   EKG:  The ekg ordered today demonstrates ***  Recent Labs: 10/08/2019: Hemoglobin 13.8; Platelets 167; TSH 1.610 12/17/2019: BUN 16; Creatinine, Ser 0.99; Magnesium 2.0; Potassium 4.6; Sodium 140  Recent Lipid Panel    Component Value Date/Time   CHOL  03/02/2007 0838    170         ATP III CLASSIFICATION:  <200     mg/dL   Desirable  335-456  mg/dL   Borderline High  >=256    mg/dL   High   TRIG 389 (H) 37/34/2876 0838   HDL 26 (L) 03/02/2007 0838   CHOLHDL 6.5 03/02/2007 0838   VLDL 58 (H) 03/02/2007 0838   LDLCALC  03/02/2007 0838    86        Total Cholesterol/HDL:CHD Risk Coronary Heart Disease Risk Table                     Men   Women  1/2 Average Risk   3.4   3.3    Physical Exam:    VS:  There were no vitals taken for this visit.    Wt Readings from Last 3 Encounters:  12/17/19 240 lb (108.9 kg)  10/08/19 255 lb (115.7 kg)  08/24/19 250 lb (113.4 kg)     GEN: *** Well nourished, well developed  in no acute distress HEENT: Normal NECK: No JVD; No carotid bruits LYMPHATICS: No lymphadenopathy CARDIAC: ***RRR, no murmurs, rubs, gallops RESPIRATORY:  Clear to auscultation without rales, wheezing or rhonchi  ABDOMEN: Soft, non-tender, non-distended MUSCULOSKELETAL:  No edema; No deformity  SKIN: Warm and dry NEUROLOGIC:  Alert and oriented x 3 PSYCHIATRIC:  Normal affect   ASSESSMENT:    1. Chronic systolic (congestive) heart failure (HCC)   2. NSVT (nonsustained ventricular tachycardia) (HCC)   3. LBBB (left bundle branch block)    PLAN:    In order of problems listed above:  1. Chronic systolic heart failure, NYHA II Presumed to be nonischemic although no formal coronary evaluation. Echo from 2019 showed global dysfunction which supports this. Medical therapy is limited by hypotension. Given his LBBB on ECG, persistently low EF, I feel that he would benefit from an ICD for primary prevention. Given his LBBB, low EF and NYHA II symptoms, I would favor a CRT-D system. I am concerned about the degree of ventricular ectopy he is having on the recent heart monitor. On a remote ECG, was monomorphic and on the monitor, it also has a dominant morphology. Previous ECG suggestive of a LV inferior wall source. I would like to have some form of  imaging performed of the LV to assess for scar buden and for ischemia.*** This may be another target for therapy if the response to resynchronization therapy is suboptimal but would favor CRT first and monitoring for response.   2. NSVT  Unclear if this is a contributor to the low EF or if it is only a manifestation of his severe cardiomyopathy.  3. LBBB Stable on ECGs. Given LBBB and persistently low EF, recommend CRT-D.  4. Tobacco Abuse Counseled the patient on importance of tobacco cessation.   Medication Adjustments/Labs and Tests Ordered: Current medicines are reviewed at length with the patient today.  Concerns regarding medicines are outlined above.  No orders of the defined types were placed in this encounter.  No orders of the defined types were placed in this encounter.   There are no Patient Instructions on file for this visit.   Signed, Lanier Prude, MD  12/30/2019 1:51 PM    [facility]   Anticoagulation instructions: {OACHOLD:24226}  Medication instructions morning of: {Blank single:19197::"The patient should hold ALL medications the morning of the procedure","The patient should take their medications the morning of the procedure, except for any diuretic (lasix, torsemide, or bumex).","The patient does not need to hold any medications the morning of the procedure."}   Discharge: Our plan will be to {Blank single:19197::"observe the patient overnight.","discharge the patient same day after a period of observation","admit the patient for further evaluation and treatment"}

## 2019-12-31 ENCOUNTER — Institutional Professional Consult (permissible substitution): Payer: Self-pay | Admitting: Cardiology

## 2019-12-31 ENCOUNTER — Ambulatory Visit: Payer: Self-pay | Admitting: Cardiovascular Disease

## 2020-01-03 ENCOUNTER — Other Ambulatory Visit: Payer: Self-pay

## 2020-01-03 ENCOUNTER — Ambulatory Visit (HOSPITAL_COMMUNITY): Payer: Self-pay | Attending: Cardiology

## 2020-01-03 DIAGNOSIS — I472 Ventricular tachycardia: Secondary | ICD-10-CM | POA: Insufficient documentation

## 2020-01-03 DIAGNOSIS — I5022 Chronic systolic (congestive) heart failure: Secondary | ICD-10-CM | POA: Insufficient documentation

## 2020-01-03 LAB — ECHOCARDIOGRAM COMPLETE
Area-P 1/2: 4.01 cm2
S' Lateral: 5.7 cm

## 2020-01-07 NOTE — Progress Notes (Signed)
Electrophysiology Office Note:    Date:  01/08/2020   ID:  Cody Carpenter, DOB 07-Sep-1965, MRN [id number]  PCP:  Patient, No Pcp Per  [facility] Cardiologist:  Rollene Rotunda, MD  [facility] Electrophysiologist:  Lanier Prude, MD   Referring MD: Rollene Rotunda, MD   Chief Complaint: NICM, Eval for ICD  History of Present Illness:    Cody Carpenter is a 54 y.o. male with a hx of nonischemic cardiomyopathy and left bundle branch block who presents for evaluation of possible ICD implant at the request of Dr. Antoine Poche.  Recent Holter showed frequent ventricular ectopy and nonsustained ventricular tachycardia. No syncope.   Past Medical History:  Diagnosis Date  . CHF (congestive heart failure) (HCC)   . Hypertension   . Sleep apnea    CPAP    Past Surgical History:  Procedure Laterality Date  . NO PAST SURGERIES      Current Medications: Current Meds  Medication Sig  . carvedilol (COREG) 12.5 MG tablet Take 1 tablet (12.5 mg total) by mouth 2 (two) times daily with a meal.  . carvedilol (COREG) 6.25 MG tablet Take 1 tablet (6.25 mg total) by mouth 2 (two) times daily. Take with the 12.5mg  tablets  . sacubitril-valsartan (ENTRESTO) 49-51 MG Take 1 tablet by mouth 2 (two) times daily. Please make annual appt in April for refills. Thank you  . spironolactone (ALDACTONE) 25 MG tablet TAKE 1 TABLET BY MOUTH AT BEDTIME.     Allergies:   Patient has no known allergies.   Social History   Socioeconomic History  . Marital status: Single    Spouse name: Not on file  . Number of children: 3  . Years of education: Not on file  . Highest education level: Not on file  Occupational History  . Occupation: roofer  Tobacco Use  . Smoking status: Current Every Day Smoker    Packs/day: 1.00    Years: 25.00    Pack years: 25.00    Types: Cigarettes  . Smokeless tobacco: Never Used  Substance and Sexual Activity  . Alcohol use: Yes  . Drug use: No  . Sexual activity:  Not on file  Other Topics Concern  . Not on file  Social History Narrative  . Not on file   Social Determinants of Health   Financial Resource Strain:   . Difficulty of Paying Living Expenses: Not on file  Food Insecurity:   . Worried About Programme researcher, broadcasting/film/video in the Last Year: Not on file  . Ran Out of Food in the Last Year: Not on file  Transportation Needs:   . Lack of Transportation (Medical): Not on file  . Lack of Transportation (Non-Medical): Not on file  Physical Activity:   . Days of Exercise per Week: Not on file  . Minutes of Exercise per Session: Not on file  Stress:   . Feeling of Stress : Not on file  Social Connections:   . Frequency of Communication with Friends and Family: Not on file  . Frequency of Social Gatherings with Friends and Family: Not on file  . Attends Religious Services: Not on file  . Active Member of Clubs or Organizations: Not on file  . Attends Banker Meetings: Not on file  . Marital Status: Not on file     Family History: The patient's family history includes Heart disease in his maternal grandfather; Lung cancer in his maternal grandmother.  ROS:   Please see  the history of present illness.    All other systems reviewed and are negative.  EKGs/Labs/Other Studies Reviewed:    The following studies were reviewed today: Echo, ecg  01/03/2020 Echo 1. There has been no significant change since the prior study on  12/29/2018.  2. Left ventricular ejection fraction, by estimation, is 20 to 25%. The  left ventricle has severely decreased function. The left ventricle  demonstrates global hypokinesis. The left ventricular internal cavity size  was moderately dilated. There is  moderate concentric left ventricular hypertrophy. Left ventricular  diastolic parameters are consistent with Grade III diastolic dysfunction  (restrictive). Elevated left ventricular end-diastolic pressure.  3. Right ventricular systolic function is  normal. The right ventricular  size is mildly enlarged. There is normal pulmonary artery systolic  pressure.  4. Left atrial size was mildly dilated.  5. The mitral valve is normal in structure. Mild mitral valve  regurgitation. No evidence of mitral stenosis.  6. The aortic valve is normal in structure. Aortic valve regurgitation is  not visualized. No aortic stenosis is present.  7. The inferior vena cava is normal in size with <50% respiratory  variability, suggesting right atrial pressure of 8 mmHg.   10/08/2019 ECG shows sinus rhythm, LBBB and PVC originating from the inferior wall   EKG:  The ekg ordered today demonstrates sinus with LBBB. Poor R wave progression.  Recent Labs: 10/08/2019: Hemoglobin 13.8; Platelets 167; TSH 1.610 12/17/2019: BUN 16; Creatinine, Ser 0.99; Magnesium 2.0; Potassium 4.6; Sodium 140  Recent Lipid Panel    Component Value Date/Time   CHOL  03/02/2007 0838    170        ATP III CLASSIFICATION:  <200     mg/dL   Desirable  992-426  mg/dL   Borderline High  >=834    mg/dL   High   TRIG 196 (H) 22/29/7989 0838   HDL 26 (L) 03/02/2007 0838   CHOLHDL 6.5 03/02/2007 0838   VLDL 58 (H) 03/02/2007 0838   LDLCALC  03/02/2007 0838    86        Total Cholesterol/HDL:CHD Risk Coronary Heart Disease Risk Table                     Men   Women  1/2 Average Risk   3.4   3.3    Physical Exam:    VS:  BP 110/66   Pulse 75   Ht 5\' 7"  (1.702 m)   Wt 237 lb 3.2 oz (107.6 kg)   SpO2 95%   BMI 37.15 kg/m     Wt Readings from Last 3 Encounters:  01/08/20 237 lb 3.2 oz (107.6 kg)  12/17/19 240 lb (108.9 kg)  10/08/19 255 lb (115.7 kg)     GEN:  Well nourished, well developed in no acute distress HEENT: Normal NECK: No JVD; No carotid bruits LYMPHATICS: No lymphadenopathy CARDIAC: RRR, no murmurs, rubs, gallops RESPIRATORY:  Clear to auscultation without rales, wheezing or rhonchi  ABDOMEN: Soft, non-tender, non-distended MUSCULOSKELETAL:  No  edema; No deformity  SKIN: Warm and dry NEUROLOGIC:  Alert and oriented x 3 PSYCHIATRIC:  Normal affect   ASSESSMENT:    1. Chronic systolic (congestive) heart failure (HCC)   2. NSVT (nonsustained ventricular tachycardia) (HCC)   3. Tobacco abuse    PLAN:    In order of problems listed above:  1. Chronic systolic heart failure Class II symptoms. On good medical therapy with entresto, coreg, aldactone.  He  has a left bundle branch block and would likely benefit from a CRT-D. He is working on Training and development officer and once this is worked out, I would like to get a cardiac MRI. Pending those results, would plan for CRT-D implant. Will plan on a 3 month follow up appointment. If he arranges insurance before our appointment, can help arrange for a cardiac MRI and CRT-D implant sooner.  2. NSVT Continue HF regimen.   3. Tobacco abuse    Medication Adjustments/Labs and Tests Ordered: Current medicines are reviewed at length with the patient today.  Concerns regarding medicines are outlined above.  Orders Placed This Encounter  Procedures  . EKG 12-Lead   No orders of the defined types were placed in this encounter.   Patient Instructions  Medication Instructions:  Your physician recommends that you continue on your current medications as directed. Please refer to the Current Medication list given to you today.  Labwork: None ordered.  Testing/Procedures: None ordered.  Follow-Up: Your physician wants you to follow-up in: one year with Dr. Lalla Brothers.   You will receive a reminder letter in the mail two months in advance. If you don't receive a letter, please call our office to schedule the follow-up appointment.   Any Other Special Instructions Will Be Listed Below (If Applicable).  If you need a refill on your cardiac medications before your next appointment, please call your pharmacy.      Signed, Steffanie Dunn, MD, [facility]  01/08/2020 11:28 AM    Electrophysiology [facility]

## 2020-01-08 ENCOUNTER — Other Ambulatory Visit: Payer: Self-pay

## 2020-01-08 ENCOUNTER — Ambulatory Visit (INDEPENDENT_AMBULATORY_CARE_PROVIDER_SITE_OTHER): Payer: Self-pay | Admitting: Cardiology

## 2020-01-08 ENCOUNTER — Encounter: Payer: Self-pay | Admitting: Cardiology

## 2020-01-08 VITALS — BP 110/66 | HR 75 | Ht 67.0 in | Wt 237.2 lb

## 2020-01-08 DIAGNOSIS — I472 Ventricular tachycardia: Secondary | ICD-10-CM

## 2020-01-08 DIAGNOSIS — I4729 Other ventricular tachycardia: Secondary | ICD-10-CM

## 2020-01-08 DIAGNOSIS — Z720 Tobacco use: Secondary | ICD-10-CM

## 2020-01-08 DIAGNOSIS — I5022 Chronic systolic (congestive) heart failure: Secondary | ICD-10-CM

## 2020-01-08 NOTE — Patient Instructions (Addendum)
Medication Instructions:  Your physician recommends that you continue on your current medications as directed. Please refer to the Current Medication list given to you today.  Labwork: None ordered.  Testing/Procedures: None ordered.  Follow-Up:  April 09, 2020 at 1:15 pm with Dr. Lalla Brothers at the [facility] office   Any Other Special Instructions Will Be Listed Below (If Applicable).  If you need a refill on your cardiac medications before your next appointment, please call your pharmacy.

## 2020-01-10 ENCOUNTER — Telehealth: Payer: Self-pay | Admitting: Licensed Clinical Social Worker

## 2020-01-10 NOTE — Telephone Encounter (Signed)
CSW referred to assist patient with insurance for needed procedures. Patient reports he has some upcoming needed procedures and is uninsured. CSW consulted with financial counseling and patient will be referred for disability/medicaid application. Financial Counselor will complete application and mail to patient to sign and return to county once signed and completed. Patient grateful for the support and assistance. CSW available as needed.  Lasandra Beech, LCSW, CCSW-MCS [phone]

## 2020-01-21 MED FILL — SPIRONOLACTONE 25 MG TABS: 25 | 90 days supply | Qty: 90 | Fill #1

## 2020-01-29 DIAGNOSIS — R002 Palpitations: Secondary | ICD-10-CM | POA: Insufficient documentation

## 2020-01-29 DIAGNOSIS — Z7189 Other specified counseling: Secondary | ICD-10-CM | POA: Insufficient documentation

## 2020-01-29 NOTE — Progress Notes (Signed)
Cardiology Office Note   Date:  01/31/2020   ID:  Cody Carpenter, DOB [birth year], MRN [id number]  PCP:  Patient, No Pcp Per  Cardiologist:   Rollene Rotunda, MD   Chief Complaint  Patient presents with  . Palpitations      History of Present Illness: Cody Carpenter is a 54 y.o. 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  His EF had been 20% in 2008 but improved with medical management. Unfortunately he eventually stopped his meds and his EF went from a high of 55% to 25% in 2015 and after med titration only went up to 30% in 2016.  However, in August his EF was 20 - 25%.   I have had a difficult time titrating his meds because of hypotension.  I did send him for a CPX recently.  He had a submax effort.  Peak VO2 was 17.5.    He was having palpitations at the last appt and I ordered a monitor which demonstrated frequent ventricular ectopy and NSVT.    I sent him for EP evaluation and he is being considered for CRT-D.  He needs an MRI.  Both of these things are pending some insurance issues.  At the last visit I crept up on his carvedilol.  He thinks he had fewer palpitations.  He is only having a couple of day instead of multiple times a day.  He said no presyncope or syncope.  He denies any chest pressure, neck or arm discomfort.  He has had no new shortness of breath, PND or orthopnea.  We did repeat an echocardiogram which demonstrates moderate concentric left ventricular hypertrophy with an EF 20%.  This was essentially unchanged from previous.  Past Medical History:  Diagnosis Date  . CHF (congestive heart failure) (HCC)   . Hypertension   . Sleep apnea    CPAP    Past Surgical History:  Procedure Laterality Date  . NO PAST SURGERIES       Current Outpatient Medications  Medication Sig Dispense Refill  . carvedilol (COREG) 25 MG tablet Take 1 tablet (25 mg total) by mouth 2 (two) times daily with a meal. 60 tablet 3  .  sacubitril-valsartan (ENTRESTO) 49-51 MG Take 1 tablet by mouth 2 (two) times daily. Please make annual appt in April for refills. Thank you 180 tablet 0  . spironolactone (ALDACTONE) 25 MG tablet TAKE 1 TABLET BY MOUTH AT BEDTIME. 90 tablet 3   No current facility-administered medications for this visit.    Allergies:   Patient has no known allergies.    ROS:  Please see the history of present illness.   Otherwise, review of systems are positive for none..   All other systems are reviewed and negative.    PHYSICAL EXAM: VS:  BP 112/64   Pulse 65   Resp 18   Ht 5\' 7"  (1.702 m)   Wt 235 lb (106.6 kg)   BMI 36.81 kg/m  , BMI Body mass index is 36.81 kg/m. GENERAL:  Well appearing NECK:  No jugular venous distention, waveform within normal limits, carotid upstroke brisk and symmetric, no bruits, no thyromegaly LUNGS:  Clear to auscultation bilaterally CHEST:  Unremarkable HEART:  PMI not displaced or sustained,S1 and S2 within normal limits, no S3, no S4, no clicks, no rubs, no murmurs ABD:  Flat, positive bowel sounds normal in frequency in pitch, no bruits, no rebound, no guarding, no midline  pulsatile mass, no hepatomegaly, no splenomegaly EXT:  2 plus pulses throughout, no edema, no cyanosis no clubbing    EKG:  EKG is not ordered today.   Recent Labs: 10/08/2019: Hemoglobin 13.8; Platelets 167; TSH 1.610 12/17/2019: BUN 16; Creatinine, Ser 0.99; Magnesium 2.0; Potassium 4.6; Sodium 140    Lipid Panel    Component Value Date/Time   CHOL  03/02/2007 0838    170        ATP III CLASSIFICATION:  <200     mg/dL   Desirable  258-527  mg/dL   Borderline High  >=782    mg/dL   High   TRIG 423 (H) 53/61/4431 0838   HDL 26 (L) 03/02/2007 0838   CHOLHDL 6.5 03/02/2007 0838   VLDL 58 (H) 03/02/2007 0838   LDLCALC  03/02/2007 0838    86        Total Cholesterol/HDL:CHD Risk Coronary Heart Disease Risk Table                     Men   Women  1/2 Average Risk   3.4   3.3       Wt Readings from Last 3 Encounters:  01/31/20 235 lb (106.6 kg)  01/08/20 237 lb 3.2 oz (107.6 kg)  12/17/19 240 lb (108.9 kg)      Other studies Reviewed: Additional studies/ records that were reviewed today include: Echo. Review of the above records demonstrates: See elsewhere  ASSESSMENT AND PLAN:     CHF: Again we are going to pursue MRI and CRT-D when his Medicaid comes through.  I be happy to help him with this application.  Today I am going to increase his carvedilol to 25 mg twice daily.   PVCs: This is being managed in the context of treating his cardiomyopathy.   HTN:  This is being managed in the context of treating his CHF   TOBACCO ABUSE: We talked again about the need to stop smoking.   SLEEP APNEA:  He cancelled an appt with Dr. Tresa Endo.  We will pursue this again when the Medicaid is complete.    COVID EDUCATION:     He has not wanted to consider vaccine.  Current medicines are reviewed at length with the patient today.  The patient does not have concerns regarding medicines.  The following changes have been made:   As above  Labs/ tests ordered today include:   No orders of the defined types were placed in this encounter.    Disposition:   FU with me in 3 months.      Signed, Rollene Rotunda, MD  01/31/2020 11:24 AM    [facility]

## 2020-01-31 ENCOUNTER — Other Ambulatory Visit: Payer: Self-pay

## 2020-01-31 ENCOUNTER — Ambulatory Visit (INDEPENDENT_AMBULATORY_CARE_PROVIDER_SITE_OTHER): Payer: Self-pay | Admitting: Cardiology

## 2020-01-31 ENCOUNTER — Other Ambulatory Visit (HOSPITAL_COMMUNITY): Payer: Self-pay | Admitting: Cardiology

## 2020-01-31 ENCOUNTER — Encounter: Payer: Self-pay | Admitting: Cardiology

## 2020-01-31 VITALS — BP 112/64 | HR 65 | Resp 18 | Ht 67.0 in | Wt 235.0 lb

## 2020-01-31 DIAGNOSIS — I472 Ventricular tachycardia: Secondary | ICD-10-CM

## 2020-01-31 DIAGNOSIS — Z7189 Other specified counseling: Secondary | ICD-10-CM

## 2020-01-31 DIAGNOSIS — I5022 Chronic systolic (congestive) heart failure: Secondary | ICD-10-CM

## 2020-01-31 DIAGNOSIS — Z720 Tobacco use: Secondary | ICD-10-CM

## 2020-01-31 DIAGNOSIS — I10 Essential (primary) hypertension: Secondary | ICD-10-CM

## 2020-01-31 DIAGNOSIS — R002 Palpitations: Secondary | ICD-10-CM

## 2020-01-31 DIAGNOSIS — I4729 Other ventricular tachycardia: Secondary | ICD-10-CM

## 2020-01-31 MED ORDER — CARVEDILOL 25 MG PO TABS: 25.0000 mg | ORAL_TABLET | Freq: Two times a day (BID) | ORAL | 3 refills | Status: DC

## 2020-01-31 MED FILL — CARVEDILOL 25 MG TABLET: 25 | 30 days supply | Qty: 60 | Fill #0

## 2020-01-31 NOTE — Patient Instructions (Signed)
Medication Instructions:  INCREASE carvedilol (Coreg) to 25 mg two times daily  *If you need a refill on your cardiac medications before your next appointment, please call your pharmacy*  Follow-Up: At [facility], you and your health needs are our priority.  As part of our continuing mission to provide you with exceptional heart care, we have created designated Provider Care Teams.  These Care Teams include your primary Cardiologist (physician) and Advanced Practice Providers (APPs -  Physician Assistants and Nurse Practitioners) who all work together to provide you with the care you need, when you need it.  We recommend signing up for the patient portal called "MyChart".  Sign up information is provided on this After Visit Summary.  MyChart is used to connect with patients for Virtual Visits (Telemedicine).  Patients are able to view lab/test results, encounter notes, upcoming appointments, etc.  Non-urgent messages can be sent to your provider as well.   To learn more about what you can do with MyChart, go to [email or web address].    Your next appointment:   3 month(s)  The format for your next appointment:   In Person  Provider:   Rollene Rotunda, MD

## 2020-02-21 ENCOUNTER — Other Ambulatory Visit: Payer: Self-pay | Admitting: Orthopaedic Surgery

## 2020-03-04 MED FILL — CARVEDILOL 25 MG TABLET: 25 | 30 days supply | Qty: 60 | Fill #1

## 2020-04-08 MED FILL — CARVEDILOL 25 MG TABS: 25 | 30 days supply | Qty: 60 | Fill #2

## 2020-04-08 MED FILL — SPIRONOLACTONE 25 MG TABS: 25 | 90 days supply | Qty: 90 | Fill #2

## 2020-04-09 ENCOUNTER — Ambulatory Visit: Payer: Self-pay | Admitting: Cardiology

## 2020-04-17 ENCOUNTER — Other Ambulatory Visit: Payer: Self-pay

## 2020-04-17 ENCOUNTER — Encounter: Payer: Self-pay | Admitting: Cardiovascular Disease

## 2020-04-17 ENCOUNTER — Telehealth: Payer: Self-pay | Admitting: Licensed Clinical Social Worker

## 2020-04-17 ENCOUNTER — Ambulatory Visit (INDEPENDENT_AMBULATORY_CARE_PROVIDER_SITE_OTHER): Payer: Self-pay | Admitting: Cardiovascular Disease

## 2020-04-17 VITALS — BP 98/59 | HR 76 | Ht 67.0 in | Wt 230.8 lb

## 2020-04-17 DIAGNOSIS — I420 Dilated cardiomyopathy: Secondary | ICD-10-CM

## 2020-04-17 DIAGNOSIS — I5042 Chronic combined systolic (congestive) and diastolic (congestive) heart failure: Secondary | ICD-10-CM

## 2020-04-17 DIAGNOSIS — I447 Left bundle-branch block, unspecified: Secondary | ICD-10-CM

## 2020-04-17 DIAGNOSIS — G4733 Obstructive sleep apnea (adult) (pediatric): Secondary | ICD-10-CM

## 2020-04-17 DIAGNOSIS — I5189 Other ill-defined heart diseases: Secondary | ICD-10-CM

## 2020-04-17 NOTE — Progress Notes (Signed)
Heart and Vascular Care Navigation  04/17/2020  Cody Carpenter 04/15/1966 [id number]  Reason for Referral:  No insurance, needs additional testing, unemployed                                                                                                    Assessment:                                     CSW spoke w/ pt via telephone. Introduced self, role, reason for call.  Pt confirmed mailing address, phone number and emergency contact (significant other). Pt has been staying w/ his friend at [address], [place], [place], [address]. Pt has been chipping in to help his friend around the house in exchange for letting him stay there. 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 He was able to apply for Medicaid and disability with the assistance of the financial counseling department this year (he estimates it probably was around October). I shared w/ pt that I had received an update from financial counseling department that his case is still pending with Disability Determination Services (case worker is Government social research officer at DSS [email or web address] .[place]). Pt must receive a denial for Medicaid before submitting for [employer]. CSW will assist if that becomes appropriate. We discussed other items such as rent and utilities (his friend manages that), transportation (pt drives himself, I made him aware of assistance w/ [employer] and is okay with being enrolled in that), and medications (he is able to manage, [employer] is processed through PAP). Pt did share with this writer that he does not currently receive food stamps and would appreciate assistance w/ completion of that form.     HRT/VAS Care Coordination    Patients Home Cardiology Office [facility]   Outpatient Care Team Social Worker   Social Worker Name: [facility], LCSW, [facility]   Living arrangements  for the past 2 months Single Family Home; No permanent address   Lives with: Relatives   Patient Current Insurance Coverage Medicaid Pending   Patient Has Concern With Paying Medical Bills Yes   Patient Concerns With Medical Bills Medicaid Pending   Medical Bill Referrals: Confirmed w/ Financial Counseling   Does Patient Have Prescription Coverage? No   Patient Prescription Assistance Programs Other   Other Assistance Programs Medications GoodRx;  PAP for [facility] Assistive Devices/Equipment None      Social History:                                                                             SDOH Screenings   Alcohol  Screen: Not on file  Depression (PHQ2-9): Not on file  Financial Resource Strain: High Risk   Difficulty of Paying Living Expenses: Very hard  Food Insecurity: Food Insecurity Present   Worried About Running Out of Food in the Last Year: Sometimes true   Ran Out of Food in the Last Year: Sometimes true  Housing: High Risk   Last Housing Risk Score: 2  Physical Activity: Not on file  Social Connections: Not on file  Stress: Not on file  Tobacco Use: High Risk   Smoking Tobacco Use: Current Every Day Smoker   Smokeless Tobacco Use: Never Used  Transportation Needs: No Transportation Needs   Lack of Transportation (Medical): No   Lack of Transportation (Non-Medical): No    SDOH Interventions:  Financial Resources:  Corporate treasurer Interventions: Chiropodist (Comment) ([facility]; [facility] Referral for [employer])  Food Insecurity:  Food Insecurity Interventions: Assist with [employer]  Housing Insecurity:  Housing Interventions: Other (Comment) (VISPDAT)  Transportation:   Transportation Interventions: Retail banker   Other Care Navigation Interventions:     Provided Pharmacy assistance resources Other   Follow-up plan:   CSW completed referral form for [employer] to assist with applying for food stamps. Pt  knows we need his signature before sending it in for review. We discussed pt potentially coming into the office on Monday to sign and meet this Clinical research associate. CSW will also send referral to Transportation Services to enroll pt in their services should he need them in the future. CSW name and number provided to pt for any additional questions/concerns.

## 2020-04-17 NOTE — Patient Instructions (Signed)
Medication Instructions:  No changes *If you need a refill on your cardiac medications before your next appointment, please call your pharmacy*   Lab Work: None ordered If you have labs (blood work) drawn today and your tests are completely normal, you will receive your results only by:  MyChart Message (if you have MyChart) OR  A paper copy in the mail If you have any lab test that is abnormal or we need to change your treatment, we will call you to review the results.   Testing/Procedures: None ordered   Follow-Up: At [facility], you and your health needs are our priority.  As part of our continuing mission to provide you with exceptional heart care, we have created designated Provider Care Teams.  These Care Teams include your primary Cardiologist (physician) and Advanced Practice Providers (APPs -  Physician Assistants and Nurse Practitioners) who all work together to provide you with the care you need, when you need it.  We recommend signing up for the patient portal called "MyChart".  Sign up information is provided on this After Visit Summary.  MyChart is used to connect with patients for Virtual Visits (Telemedicine).  Patients are able to view lab/test results, encounter notes, upcoming appointments, etc.  Non-urgent messages can be sent to your provider as well.   To learn more about what you can do with MyChart, go to [email or web address].    Your next appointment:   2 month(s) for Sleep follow up  The format for your next appointment:   In Person  Provider:   Nicki Guadalajara, MD   Other Instructions None

## 2020-04-18 ENCOUNTER — Telehealth: Payer: Self-pay | Admitting: Licensed Clinical Social Worker

## 2020-04-18 NOTE — Telephone Encounter (Signed)
Referral sent to [facility].   Octavio Graves, MSW, LCSW [facility] Heart/Vascular Care Navigation  [phone]

## 2020-04-20 ENCOUNTER — Encounter: Payer: Self-pay | Admitting: Cardiovascular Disease

## 2020-04-20 NOTE — Progress Notes (Signed)
Cardiology Office Note    Date:  04/20/2020   ID:  Carpenter Cody, DOB 1965/09/04, MRN [id number]  PCP:  Patient, No Pcp Per  Cardiologist:  Shelva Majestic, MD (sleep); Dr. Percival Spanish  Initial sleep evaluation with me  History of Present Illness:  Cody Carpenter is a 54 y.o. male who is followed by Dr. Percival Spanish for cardiology care.  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  LV function improved with medical therapy and with subsequent discontinuance of medical therapy his heart function again reduced to 20 to 25%.  He has had issues with lack of insurance.  He is applying for Medicaid.  Most recently he has been treated with carvedilol 25 mg twice a day, Entresto 49/51 mg twice a day and spironolactone 25 mg.  He was evaluated by Dr. Quentin Ore for consideration of possible ICD implant.  He is waiting for Medicaid approval prior to proceeding.  The patient has a longstanding history of sleep apnea which was originally diagnosed in 2008.  He initially had seen Dr. Danton Sewer and apparently his sleep apnea was severe and he was started on CPAP therapy.  Due to lack of insurance, patient never followed up but last saw Dr. Normajean Baxter in 2015.  At that time he had not had any new supplies for over 3 to 4 years.  There was discussion about follow-up evaluation but apparently this never evolved.  Presently, he still has his original old CPAP machine which is an Chief Executive [employer] 2.  Apparently he does not have any headgear or tubing.  He cannot afford another sleep evaluation presently or even if bought online rather than through the DME company.  He typically goes to bed at 10 PM and usually is awake within several hours.  His sleep is poor.  He typically is out of bed at 5 AM.  He admits to snoring, occasional nocturia, his sleep is nonrestorative.  An Epworth Sleepiness Scale was calculated in the office today and this endorsed at 15 as shown below:   Epworth  Sleepiness Scale: Situation   Chance of Dozing/Sleeping (0 = never , 1 = slight chance , 2 = moderate chance , 3 = high chance )   sitting and reading 0   watching TV 3   sitting inactive in a public place 3   being a passenger in a motor vehicle for an hour or more 3   lying down in the afternoon 3   sitting and talking to someone 0   sitting quietly after lunch (no alcohol) 3   while stopped for a few minutes in traffic as the driver 0   Total Score  15   He is unaware of any bruxism, restless legs, hypnagogic hallucinations or cataplectic events.  He presents for initial evaluation.   Past Medical History:  Diagnosis Date  . CHF (congestive heart failure) ([place])   . Hypertension   . Sleep apnea    CPAP    Past Surgical History:  Procedure Laterality Date  . NO PAST SURGERIES      Current Medications: Outpatient Medications Prior to Visit  Medication Sig Dispense Refill  . carvedilol (COREG) 25 MG tablet Take 1 tablet (25 mg total) by mouth 2 (two) times daily with a meal. 60 tablet 3  . sacubitril-valsartan (ENTRESTO) 49-51 MG Take 1 tablet by mouth 2 (two) times daily. Please make annual appt in April for refills. Thank you  180 tablet 0  . spironolactone (ALDACTONE) 25 MG tablet TAKE 1 TABLET BY MOUTH AT BEDTIME. 90 tablet 3   No facility-administered medications prior to visit.     Allergies:   Patient has no known allergies.   Social History   Socioeconomic History  . Marital status: Single    Spouse name: Not on file  . Number of children: 3  . Years of education: Not on file  . Highest education level: Not on file  Occupational History  . Occupation: roofer  Tobacco Use  . Smoking status: Current Every Day Smoker    Packs/day: 1.00    Years: 25.00    Pack years: 25.00    Types: Cigarettes  . Smokeless tobacco: Never Used  Substance and Sexual Activity  . Alcohol use: Yes  . Drug use: No  . Sexual activity: Not on file  Other Topics Concern  . Not  on file  Social History Narrative  . Not on file   Social Determinants of Health   Financial Resource Strain: High Risk  . Difficulty of Paying Living Expenses: Very hard  Food Insecurity: Food Insecurity Present  . Worried About Charity fundraiser in the Last Year: Sometimes true  . Ran Out of Food in the Last Year: Sometimes true  Transportation Needs: No Transportation Needs  . Lack of Transportation (Medical): No  . Lack of Transportation (Non-Medical): No  Physical Activity: Not on file  Stress: Not on file  Social Connections: Not on file    Social history is notable that he is single.  He never married.  He has 2 grown children ages 84 and 34.  He previously worked in the [employer].  He lives by himself.  He was born in [place].  Family History:  The patient's family history includes Heart disease in his maternal grandfather; Lung cancer in his maternal grandmother.  His father was killed at age 24.  His mother died of pneumonia at age 49.  He has 1 sister age 71.  ROS General: Negative; No fevers, chills, or night sweats;  HEENT: Negative; No changes in vision or hearing, sinus congestion, difficulty swallowing Pulmonary: Negative; No cough, wheezing, shortness of breath, hemoptysis Cardiovascular: See HPI, nonischemic cardiomyopathy, GI: Negative; No nausea, vomiting, diarrhea, or abdominal pain GU: Negative; No dysuria, hematuria, or difficulty voiding Musculoskeletal: Negative; no myalgias, joint pain, or weakness Hematologic/Oncology: Negative; no easy bruising, bleeding Endocrine: Negative; no heat/cold intolerance; no diabetes Neuro: Negative; no changes in balance, headaches Skin: Negative; No rashes or skin lesions Psychiatric: Negative; No behavioral problems, depression Sleep: Severe OSA on initial sleep study in 2008.  He has an old S8 ResMed Elite 2 CPAP unit; poor sleep, frequent awakenings, snoring, daytime sleepiness  Other comprehensive 14  point system review is negative.   PHYSICAL EXAM:   VS:  BP (!) 98/59   Pulse 76   Ht _0  (1.702 m)   Wt 230 lb 12.8 oz (104.7 kg)   SpO2 98%   BMI 36.15 kg/m    Wt Readings from Last 3 Encounters:  04/17/20 230 lb 12.8 oz (104.7 kg)  01/31/20 235 lb (106.6 kg)  01/08/20 237 lb 3.2 oz (107.6 kg)    General: Alert, oriented, no distress.  Skin: normal turgor, no rashes, warm and dry HEENT: Normocephalic, atraumatic. Pupils equal round and reactive to light; sclera anicteric; extraocular muscles intact;  Nose without nasal septal hypertrophy Mouth/Parynx benign; Mallinpatti scale 3 Neck: No JVD, no carotid  bruits; normal carotid upstroke Lungs: clear to ausculatation and percussion; no wheezing or rales Chest wall: without tenderness to palpitation Heart: PMI not displaced, RRR, s1 s2 normal, 1/6 systolic murmur, no diastolic murmur, no rubs, gallops, thrills, or heaves Abdomen: soft, nontender; no hepatosplenomehaly, BS+; abdominal aorta nontender and not dilated by palpation. Back: no CVA tenderness Pulses 2+ Musculoskeletal: full range of motion, normal strength, no joint deformities Extremities: no clubbing cyanosis or edema, Homan's sign negative  Neurologic: grossly nonfocal; Cranial nerves grossly wnl Psychologic: Normal mood and affect   Studies/Labs Reviewed:   EKG:  EKG is not ordered today.  I personally reviewed his last ECG of January 08, 2020 which showed sinus rhythm at 75 bpm, left bundle branch block, QRS duration 152 ms.  No ectopy.  Recent Labs: BMP Latest Ref Rng & Units 12/17/2019 10/08/2019 06/29/2018  Glucose 65 - 99 mg/dL 92 96 98  BUN 6 - 24 mg/dL 16 19 22(H)  Creatinine 0.76 - 1.27 mg/dL 0.99 0.94 1.06  BUN/Creat Ratio 9 - _0 -  Sodium 134 - 144 mmol/L 140 139 137  Potassium 3.5 - 5.2 mmol/L 4.6 4.6 4.3  Chloride 96 - 106 mmol/L 104 103 109  CO2 20 - 29 mmol/L 22 21 21(L)  Calcium 8.7 - 10.2 mg/dL 9.5 9.3 9.1     Hepatic Function Latest  Ref Rng & Units 06/06/2014 03/02/2007  Total Protein 6.0 - 8.3 g/dL 8.0 6.5  Albumin 3.5 - 5.2 g/dL 3.8 3.5  AST 0 - 37 U/L 22 24  ALT 0 - 53 U/L 24 28  Alk Phosphatase 39 - 117 U/L 84 68  Total Bilirubin 0.3 - 1.2 mg/dL 0.5 0.7    CBC Latest Ref Rng & Units 10/08/2019 06/06/2014 03/06/2007  WBC 3.4 - 10.8 x10E3/uL 6.7 8.7 7.9  Hemoglobin 13.0 - 17.7 g/dL 13.8 14.6 15.4  Hematocrit 37.5 - 51.0 % 42.0 43.8 45.7  Platelets 150 - 450 x10E3/uL 167 211 203   Lab Results  Component Value Date   MCV 93 10/08/2019   MCV 89.0 06/06/2014   MCV 93.4 03/06/2007   Lab Results  Component Value Date   TSH 1.610 10/08/2019   No results found for: HGBA1C   BNP    Component Value Date/Time   BNP 97.8 12/23/2017 1535    ProBNP    Component Value Date/Time   PROBNP <30.0 03/07/2007 0330     Lipid Panel     Component Value Date/Time   CHOL  03/02/2007 0838    170        ATP III CLASSIFICATION:  <200     mg/dL   Desirable  200-239  mg/dL   Borderline High  >=240    mg/dL   High   TRIG 290 (H) 03/02/2007 0838   HDL 26 (L) 03/02/2007 0838   CHOLHDL 6.5 03/02/2007 0838   VLDL 58 (H) 03/02/2007 0838   LDLCALC  03/02/2007 0838    86        Total Cholesterol/HDL:CHD Risk Coronary Heart Disease Risk Table                     Men   Women  1/2 Average Risk   3.4   3.3     RADIOLOGY: No results found.   Additional studies/ records that were reviewed today include:  I reviewed the records of Dr. Gwenette Greet in 2015 as well as Drs Percival Spanish, and Dr. Quentin Ore  ASSESSMENT:  1. Severe OSA (obstructive sleep apnea)   2. Dilated cardiomyopathy ([place])   3. Chronic combined systolic and diastolic heart failure ([place])   4. Grade III diastolic dysfunction   5. Left bundle branch block     PLAN:  Mr. Avrey Hyser is a 54 year old gentleman who has a history of presumed nonischemic cardiomyopathy documented since 2008.  His most recent echo on January 03, 2020 showed an EF of 20 to 25% with  restrictive physiology. he is on guideline directed medical therapy.  He has left bundle branch block and has seen Dr. Charlett Lango for consideration of CRT-D.  This is being deferred until he gets approved for Medicaid with also plan for cardiac MRI. He has a history of severe sleep apnea which originally was diagnosed in 2008.  He has an old Pharmacist, community.  He has not had new supplies in years.  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  He cannot afford new supplies even if bought online at reduced cost or a reevaluation to obtain a new CPAP unit.  In the office today I provided him with a samples of new ResMed AirFit F 30 medium mask as well as new CPAP tubing.  Ultimately, he would benefit significantly with a new  CPAP or BiPAP machine.  I discussed with him the effects of untreated sleep apnea on his cardiovascular health including risk of nocturnal ischemia mediated arrhythmias particularly with his reduced LV function as well as potential for nocturnal ischemia.  I will see him in 2 to 3 months for follow-up evaluation and additional recommendations will be made at that time.   Medication Adjustments/Labs and Tests Ordered: Current medicines are reviewed at length with the patient today.  Concerns regarding medicines are outlined above.  Medication changes, Labs and Tests ordered today are listed in the Patient Instructions below. Patient Instructions  Medication Instructions:  No changes *If you need a refill on your cardiac medications before your next appointment, please call your pharmacy*   Lab Work: None ordered If you have labs (blood work) drawn today and your tests are completely normal, you will receive your results only by: Marland Kitchen MyChart Message (if you have MyChart) OR . A paper copy in the mail If you have any lab test that is abnormal or we need to change your treatment, we will call you to review the  results.   Testing/Procedures: None ordered   Follow-Up: At [facility], you and your health needs are our priority.  As part of our continuing mission to provide you with exceptional heart care, we have created designated Provider Care Teams.  These Care Teams include your primary Cardiologist (physician) and Advanced Practice Providers (APPs -  Physician Assistants and Nurse Practitioners) who all work together to provide you with the care you need, when you need it.  We recommend signing up for the patient portal called "MyChart".  Sign up information is provided on this After Visit Summary.  MyChart is used to connect with patients for Virtual Visits (Telemedicine).  Patients are able to view lab/test results, encounter notes, upcoming appointments, etc.  Non-urgent messages can be sent to your provider as well.   To learn more about what you can do with MyChart, go to [email or web address].    Your next appointment:   2 month(s) for Sleep follow up  The format for your next appointment:   In Person  Provider:   Shelva Majestic, MD  Other Instructions None     Signed, Shelva Majestic, MD  04/20/2020 9:58 AM    [place] [address], [place], [place], [place]  [address] Phone: [phone]

## 2020-04-21 ENCOUNTER — Telehealth: Payer: Self-pay | Admitting: Licensed Clinical Social Worker

## 2020-04-21 NOTE — Progress Notes (Signed)
CSW met with pt in the office this morning; was able to introduce self, role, reason for visit. Pt confirmed that he is still interested in referral for SNAP (food stamp) assistance. Reviewed and pt signed forms for [facility] referral program. Pt still is residing with his buddy and assists him here and there as needed. Pt shares that it is still hard for him to do regular tasks as he is exhausted w/ any exertion. He currently gets his medications from the [facility]. His Delene Loll is covered under patient assistance programs at this time. He reviewed, completed and signed assistance forms for [facility] which can assist with his Carvedilol if eligible. He will bring his completed forms and a bill w/ his [address] address to his appointment on 12/22. I have reached out to [place] w/ Dr. Quentin Ore to see if she can coordinate getting copies of those prescriptions to me to send in.   CSW also discussed VISPDAT w/ pt and at this time he is not interested in completing it or referral to [facility]. He feels relatively secure in his ability to stay with his friend and will let me know if he ends up being interested. CSW provided pt with my card, [employer] card and contact information for [employer].   CSW secure sent referral to [facility] at [email or web address]. Followed up with pt with a reminder that his appointment is at [facility] on 12/22 and not here at [facility] per schedule.   Westley Hummer, MSW, [place]  [phone]

## 2020-04-21 NOTE — Telephone Encounter (Signed)
Reached pt this morning via text message, he will come see this Clinical research associate to sign paperwork for referral to [facility] for [facility]. CSW provided pt w/ address and will have papers ready for pt when he arrives.   Octavio Graves, MSW, LCSW [facility] Health Heart/Vascular Care Navigation  [phone]

## 2020-04-23 ENCOUNTER — Encounter: Payer: Self-pay | Admitting: Cardiology

## 2020-04-23 ENCOUNTER — Telehealth: Payer: Self-pay | Admitting: Licensed Clinical Social Worker

## 2020-04-23 ENCOUNTER — Other Ambulatory Visit: Payer: Self-pay | Admitting: *Deleted

## 2020-04-23 ENCOUNTER — Ambulatory Visit (INDEPENDENT_AMBULATORY_CARE_PROVIDER_SITE_OTHER): Payer: Self-pay | Admitting: Cardiology

## 2020-04-23 ENCOUNTER — Other Ambulatory Visit: Payer: Self-pay

## 2020-04-23 VITALS — BP 100/70 | HR 67 | Ht 67.0 in | Wt 227.0 lb

## 2020-04-23 DIAGNOSIS — I447 Left bundle-branch block, unspecified: Secondary | ICD-10-CM

## 2020-04-23 DIAGNOSIS — I10 Essential (primary) hypertension: Secondary | ICD-10-CM

## 2020-04-23 DIAGNOSIS — I5042 Chronic combined systolic (congestive) and diastolic (congestive) heart failure: Secondary | ICD-10-CM

## 2020-04-23 DIAGNOSIS — I472 Ventricular tachycardia: Secondary | ICD-10-CM

## 2020-04-23 DIAGNOSIS — I4729 Other ventricular tachycardia: Secondary | ICD-10-CM

## 2020-04-23 MED ORDER — CARVEDILOL 25 MG PO TABS: 25.0000 mg | ORAL_TABLET | Freq: Two times a day (BID) | ORAL | 3 refills | Status: DC

## 2020-04-23 NOTE — Progress Notes (Signed)
Electrophysiology Office Follow up Visit Note:    Date:  04/23/2020   ID:  Cody Carpenter, DOB [birth year], MRN [id number]  PCP:  Patient, No Pcp Per  [facility] Cardiologist:  Rollene Rotunda, MD  [facility] Electrophysiologist:  Lanier Prude, MD    Interval History:    Cody Carpenter is a 54 y.o. male who presents for a follow up visit. They were last seen in clinic January 08, 2020 for nonischemic cardiomyopathy.  The plan at that visit was to pursue CRT-D implant once his Medicaid comes through.  He is overall doing okay from our last appointment although he continues to complain of exercise limitations due to fatigue.  No syncope or presyncope.  He understands the plan to pursue CRT-D and cardiac MRI once his Medicaid application is approved.     Past Medical History:  Diagnosis Date   CHF (congestive heart failure) (HCC)    Hypertension    Sleep apnea    CPAP    Past Surgical History:  Procedure Laterality Date   NO PAST SURGERIES      Current Medications: No outpatient medications have been marked as taking for the 04/23/20 encounter (Office Visit) with Lanier Prude, MD.     Allergies:   Patient has no known allergies.   Social History   Socioeconomic History   Marital status: Single    Spouse name: Not on file   Number of children: 3   Years of education: Not on file   Highest education level: Not on file  Occupational History   Occupation: roofer  Tobacco Use   Smoking status: Current Every Day Smoker    Packs/day: 1.00    Years: 25.00    Pack years: 25.00    Types: Cigarettes   Smokeless tobacco: Never Used  Substance and Sexual Activity   Alcohol use: Yes   Drug use: No   Sexual activity: Not on file  Other Topics Concern   Not on file  Social History Narrative   Not on file   Social Determinants of Health   Financial Resource Strain: High Risk   Difficulty of Paying Living Expenses: Very hard  Food  Insecurity: Food Insecurity Present   Worried About Running Out of Food in the Last Year: Sometimes true   Ran Out of Food in the Last Year: Sometimes true  Transportation Needs: No Transportation Needs   Lack of Transportation (Medical): No   Lack of Transportation (Non-Medical): No  Physical Activity: Not on file  Stress: Not on file  Social Connections: Not on file     Family History: The patient's family history includes Heart disease in his maternal grandfather; Lung cancer in his maternal grandmother.  ROS:   Please see the history of present illness.    All other systems reviewed and are negative.  EKGs/Labs/Other Studies Reviewed:    The following studies were reviewed today: Prior notes, EKGs  January 08, 2020 EKG shows left bundle branch block, sinus rhythm, late precordial transition  Recent Labs: 10/08/2019: Hemoglobin 13.8; Platelets 167; TSH 1.610 12/17/2019: BUN 16; Creatinine, Ser 0.99; Magnesium 2.0; Potassium 4.6; Sodium 140  Recent Lipid Panel    Component Value Date/Time   CHOL  03/02/2007 0838    170        ATP III CLASSIFICATION:  <200     mg/dL   Desirable  277-824  mg/dL   Borderline High  >=235    mg/dL   High  TRIG 290 (H) 03/02/2007 0838   HDL 26 (L) 03/02/2007 0838   CHOLHDL 6.5 03/02/2007 0838   VLDL 58 (H) 03/02/2007 0838   LDLCALC  03/02/2007 0838    86        Total Cholesterol/HDL:CHD Risk Coronary Heart Disease Risk Table                     Men   Women  1/2 Average Risk   3.4   3.3    Physical Exam:    VS:  There were no vitals taken for this visit.    Wt Readings from Last 3 Encounters:  04/17/20 230 lb 12.8 oz (104.7 kg)  01/31/20 235 lb (106.6 kg)  01/08/20 237 lb 3.2 oz (107.6 kg)     GEN:  Well nourished, well developed in no acute distress HEENT: Normal NECK: No JVD; No carotid bruits LYMPHATICS: No lymphadenopathy CARDIAC: RRR, no murmurs, rubs, gallops RESPIRATORY:  Clear to auscultation without rales,  wheezing or rhonchi  ABDOMEN: Soft, non-tender, non-distended MUSCULOSKELETAL:  No edema; No deformity  SKIN: Warm and dry NEUROLOGIC:  Alert and oriented x 3 PSYCHIATRIC:  Normal affect   ASSESSMENT:    1. Chronic combined systolic and diastolic heart failure (HCC)   2. NSVT (nonsustained ventricular tachycardia) (HCC)   3. Essential hypertension   4. Left bundle branch block    PLAN:    In order of problems listed above:  1. Chronic combined systolic and diastolic heart failure NYHA class II-III symptoms.  Warm and relatively dry on exam today.  Left ventricular function 15% on most recent echo.  Dilated left ventricle.  Left bundle branch block.  He is a good candidate for CRT-D therapy.  I discussed this at a prior appointment and we are awaiting his Medicaid approval before proceeding.  I would also like to get a cardiac MRI prior to the CRT-D implant.   We will put an appointment on the books for February/March but hopefully his Medicaid application comes through first so we can schedule both a cardiac MRI and CRT-D implant.  He knows to reach out to our clinic if his Medicaid application comes through and we will go ahead and get these things scheduled without him needing another appointment with me.  CRT-D implant procedure was discussed in detail with the patient again during today's visit including the risks and expected recovery time.  He wishes to proceed.  2.  Hypertension Continue Coreg, Entresto, spironolactone     Medication Adjustments/Labs and Tests Ordered: Current medicines are reviewed at length with the patient today.  Concerns regarding medicines are outlined above.  No orders of the defined types were placed in this encounter.  No orders of the defined types were placed in this encounter.    Signed, Steffanie Dunn, MD, [facility]  04/23/2020 1:37 PM    Electrophysiology [facility]

## 2020-04-23 NOTE — Patient Instructions (Addendum)
Medication Instructions:  Your physician recommends that you continue on your current medications as directed. Please refer to the Current Medication list given to you today.  Labwork: None ordered.  Testing/Procedures: None ordered.  Follow-Up:  Your physician wants you to follow-up in: 3 months with Dr. Lalla Brothers.   July 22, 2020 at 10:45 am at the [facility] office   Any Other Special Instructions Will Be Listed Below (If Applicable).  If you need a refill on your cardiac medications before your next appointment, please call your pharmacy.

## 2020-04-23 NOTE — Telephone Encounter (Addendum)
Pt confirmed he receives his Entresto via mail through PAP already at this time.  Explained we would send carvedilol prescription to [facility] and if eligible he will receive that through the mail as well. He will still need to pick up his spironolactone at the [facility]. When response received from [facility] I will reach out to him and update him regarding determination.   Barron Schmid, LPN assistance w/ e-scribe of carvedilol from [facility] of [facility].   Octavio Graves, MSW, LCSW [facility] Health Heart/Vascular Care Navigation  [phone]

## 2020-04-23 NOTE — Telephone Encounter (Signed)
CSW received additional documents needed to submit [place] [facility] application.  Completed application and supporting documents sent to [email or web address] and CSW is awaiting confirmation regarding whether or not pt has received his Entresto through PAP before sending e-scribe scripts to Med[facility]ist.  Cody Carpenter, MSW, LCSW [facility] Health Heart/Vascular Care Navigation  [phone]

## 2020-04-28 ENCOUNTER — Telehealth: Payer: Self-pay | Admitting: Licensed Clinical Social Worker

## 2020-04-28 NOTE — Telephone Encounter (Signed)
Message left for [facility] to check to ensure they have been able to contact pt to complete [facility] referral sent 12/20.  Application was also sent to [facility] on 12/23 for pt carvedilol. Await response (roughly can take 7-10 days) for that assistance.   Medicaid/Disability still pending DDS determination at this time.   Octavio Graves, MSW, LCSW [facility] Health Heart/Vascular Care Navigation  [phone]

## 2020-04-29 ENCOUNTER — Telehealth: Payer: Self-pay | Admitting: Licensed Clinical Social Worker

## 2020-04-29 NOTE — Telephone Encounter (Addendum)
CSW reached out to [facility] caseworker Natasha at [email or web address], regarding disability/Medicaid determination. Pt with upcoming appointment at [facility] and I was hoping to receive an update to provide to pt/staff at that time.   Received confirmation this morning from [facility] that pt had completed application for SNAP (food stamps) and was able to file the same day with their staff on 12/21.   1/22, MSW, LCSW [facility] Health Heart/Vascular Care Navigation  [phone]

## 2020-05-04 NOTE — Progress Notes (Signed)
Cardiology Office Note   Date:  05/06/2020   ID:  Cody Carpenter, DOB Jun 10, 1965, MRN [id number]  PCP:  Patient, No Pcp Per  Cardiologist:   Rollene Rotunda, MD   Chief Complaint  Patient presents with  . Fatigue      History of Present Illness: Cody Carpenter is a 55 y.o. 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  His EF had been 20% in 2008 but improved with medical management. Unfortunately he eventually stopped his meds and his EF went from a high of 55% to 25% in 2015 and after med titration only went up to 30% in 2016.  However, in August his EF was 20 - 25%.   I have had a difficult time titrating his meds because of hypotension.  I did send him for a CPX recently.  He had a submax effort.  Peak VO2 was 17.5.    He was having palpitations at the last appt and I ordered a monitor which demonstrated frequent ventricular ectopy and NSVT.    I sent him for EP evaluation and he is being considered for CRT-D.  He needs an MRI.  He is awaiting Medicaid.  His biggest complaint is fatigue.  He goes to bed around 1030 when he wakes up a few hours later when he is up for hours.  He then might fall asleep again for a few hours.  He has daytime somnolence.  He does have sleep apnea and probably did see Dr. Tresa Endo and they are working on getting him a new machine.  He is also been working with our Child psychotherapist to try to get Medicaid.  He is very limited in his activities because of fatigue.  He does have shortness of breath climbing 10-15 stairs though he is not describing resting shortness of breath, PND or orthopnea.  He does have episodes of feeling his heart racing and he feels a little bit panicked with this.  It lasts for about 10 seconds.  He feels breathless when this is happening.  He has seen Dr. Lalla Brothers but does not want to get at defibrillator until he has Medicaid.  He has lost weight through diet.  Past Medical History:  Diagnosis Date  .  CHF (congestive heart failure) (HCC)   . Hypertension   . Sleep apnea    CPAP    Past Surgical History:  Procedure Laterality Date  . NO PAST SURGERIES       Current Outpatient Medications  Medication Sig Dispense Refill  . carvedilol (COREG) 25 MG tablet Take 1 tablet (25 mg total) by mouth 2 (two) times daily with a meal. 180 tablet 3  . sacubitril-valsartan (ENTRESTO) 49-51 MG Take 1 tablet by mouth 2 (two) times daily. Please make annual appt in April for refills. Thank you 180 tablet 0  . spironolactone (ALDACTONE) 25 MG tablet TAKE 1 TABLET BY MOUTH AT BEDTIME. 90 tablet 3   No current facility-administered medications for this visit.    Allergies:   Patient has no known allergies.    ROS:  Please see the history of present illness.   Otherwise, review of systems are positive for none.   All other systems are reviewed and negative.    PHYSICAL EXAM: VS:  BP 116/66   Pulse 72   Ht 5\' 7"  (1.702 m)   Wt 228 lb (103.4 kg)   SpO2 99%   BMI 35.71 kg/m  ,  BMI Body mass index is 35.71 kg/m. GENERAL:  Well appearing NECK:  No jugular venous distention, waveform within normal limits, carotid upstroke brisk and symmetric, no bruits, no thyromegaly LUNGS:  Clear to auscultation bilaterally CHEST:  Unremarkable HEART:  PMI not displaced or sustained,S1 and S2 within normal limits, no S3, no S4, no clicks, no rubs, no murmurs ABD:  Flat, positive bowel sounds normal in frequency in pitch, no bruits, no rebound, no guarding, no midline pulsatile mass, no hepatomegaly, no splenomegaly EXT:  2 plus pulses throughout, no edema, no cyanosis no clubbing   EKG:  EKG is not ordered today.   Recent Labs: 10/08/2019: Hemoglobin 13.8; Platelets 167; TSH 1.610 12/17/2019: BUN 16; Creatinine, Ser 0.99; Magnesium 2.0; Potassium 4.6; Sodium 140    Lipid Panel    Component Value Date/Time   CHOL  03/02/2007 0838    170        ATP III CLASSIFICATION:  <200     mg/dL   Desirable   696-295  mg/dL   Borderline High  >=284    mg/dL   High   TRIG 132 (H) 44/05/270 0838   HDL 26 (L) 03/02/2007 0838   CHOLHDL 6.5 03/02/2007 0838   VLDL 58 (H) 03/02/2007 0838   LDLCALC  03/02/2007 0838    86        Total Cholesterol/HDL:CHD Risk Coronary Heart Disease Risk Table                     Men   Women  1/2 Average Risk   3.4   3.3      Wt Readings from Last 3 Encounters:  05/06/20 228 lb (103.4 kg)  04/23/20 227 lb (103 kg)  04/17/20 230 lb 12.8 oz (104.7 kg)      Other studies Reviewed: Additional studies/ records that were reviewed today include: None. Review of the above records demonstrates:  NA  ASSESSMENT AND PLAN:   CHRONIC SYSTOLIC HF:   He seems to be euvolemic.  He has on a reasonable medical regimen and at this point I am not going to try to titrate the [facility] although it might in the future.   Again we are going to pursue MRI and CRT-D when his Medicaid comes through.  I be happy to help him with this application.    PVCs: He is awaiting Medicaid before he consents to an ICD.   HTN:  This is managed in the context of treating his heart failure.   TOBACCO ABUSE:  We have had long discussions about the need to stop smoking.   SLEEP APNEA:  We are trying to get him a new machine and he has had his follow-up I appreciate Dr. Landry Dyke help.   COVID EDUCATION:     He has not wanted to consider vaccine and we talked about this again today.  Current medicines are reviewed at length with the patient today.  The patient does not have concerns regarding medicines.  The following changes have been made:   None  Labs/ tests ordered today include: None  No orders of the defined types were placed in this encounter.    Disposition:   FU with me in 4 months.      Signed, Rollene Rotunda, MD  05/06/2020 3:38 PM    [facility]

## 2020-05-06 ENCOUNTER — Other Ambulatory Visit: Payer: Self-pay

## 2020-05-06 ENCOUNTER — Encounter: Payer: Self-pay | Admitting: Cardiology

## 2020-05-06 ENCOUNTER — Ambulatory Visit (INDEPENDENT_AMBULATORY_CARE_PROVIDER_SITE_OTHER): Payer: Self-pay | Admitting: Cardiology

## 2020-05-06 ENCOUNTER — Telehealth: Payer: Self-pay | Admitting: Licensed Clinical Social Worker

## 2020-05-06 VITALS — BP 116/66 | HR 72 | Ht 67.0 in | Wt 228.0 lb

## 2020-05-06 DIAGNOSIS — I493 Ventricular premature depolarization: Secondary | ICD-10-CM

## 2020-05-06 DIAGNOSIS — Z720 Tobacco use: Secondary | ICD-10-CM

## 2020-05-06 DIAGNOSIS — I5022 Chronic systolic (congestive) heart failure: Secondary | ICD-10-CM

## 2020-05-06 DIAGNOSIS — I10 Essential (primary) hypertension: Secondary | ICD-10-CM

## 2020-05-06 DIAGNOSIS — G4730 Sleep apnea, unspecified: Secondary | ICD-10-CM

## 2020-05-06 NOTE — Telephone Encounter (Addendum)
CSW was able to confirm approval through 12/22 for pt to receive Carvedilol through [facility], he is aware he would still need to refill his spironolactone through [employer] and that his Sherryll Burger is covered under PAP.   CSW remains available for pt assistance as needed. Pt currently Medicaid/Disability pending.   Octavio Graves, MSW, LCSW [facility] Health Heart/Vascular Care Navigation  [phone]

## 2020-05-06 NOTE — Patient Instructions (Signed)
Medication Instructions:  No changes *If you need a refill on your cardiac medications before your next appointment, please call your pharmacy*  Lab Work: None ordered this visit  Testing/Procedures: None ordered this visit  Follow-Up: At [facility], you and your health needs are our priority.  As part of our continuing mission to provide you with exceptional heart care, we have created designated Provider Care Teams.  These Care Teams include your primary Cardiologist (physician) and Advanced Practice Providers (APPs -  Physician Assistants and Nurse Practitioners) who all work together to provide you with the care you need, when you need it.  Your next appointment:   3 month(s)  The format for your next appointment:   In Person  Provider:   James Hochrein, MD  

## 2020-05-08 ENCOUNTER — Telehealth: Payer: Self-pay | Admitting: Licensed Clinical Social Worker

## 2020-05-08 NOTE — Telephone Encounter (Signed)
CSW received update from [facility] that pt has been approved for SNAP benefits. CSW remains available for any additional assistance. No updates received yet regarding Medicaid and Disability.   Octavio Graves, MSW, LCSW [facility] Health Heart/Vascular Care Navigation  [phone]

## 2020-05-13 ENCOUNTER — Telehealth: Payer: Self-pay | Admitting: Licensed Clinical Social Worker

## 2020-05-13 NOTE — Telephone Encounter (Signed)
CSW received response back from [place] with [facility] DSS today and pt has received an affirmative disability determination from DDS. DSS is working on completing his Medicaid application. He should be updated in the [facility] Tracks system within 24-48 hours. I spoke with our billing office and they will be able to assist me with locating his coverage at that time. CSW also alerted Ancil Boozer, RN at [facility] as pt has been awaiting determination to schedule ongoing procedures and testing at their office. When Medicaid approval updated in the system I will let Boneta Lucks know and f/u with pt as well.   Cody Carpenter, MSW, LCSW [facility] Health Heart/Vascular Care Navigation  [phone]

## 2020-05-15 ENCOUNTER — Telehealth: Payer: Self-pay | Admitting: Licensed Clinical Social Worker

## 2020-05-15 NOTE — Telephone Encounter (Signed)
LCSW was able to confirm pt approved for [facility] ([id number]) per [employer]. Called and spoke with pt and he will go and check his PO Box to see if he has received the letter but is aware there may be a delay with getting that in the mail. He also is still waiting on his SNAP card (food stamps). Once he receives the [facility] letter he should bring it to appointments until he receives his formal card. I have also let Dr. Antoine Poche and Dr. Lovena Neighbours team know that pt was approved. Pt is hopeful this will allow him to move up some of the imaging and testing he was supposed to have scheduled. LCSW remains available for additional supports as needed.   Octavio Graves, MSW, LCSW [facility] Health Heart/Vascular Care Navigation  [phone]

## 2020-05-15 NOTE — Telephone Encounter (Signed)
CSW received a call back from pt and pt significant other Melissa. Pt received confirmation letter from DSS indicating that pt had been approved for Medicaid but then also included a letter requesting additional documentation and they were confused about who to contact with that. Melissa was able to locate DSS caseworkers name and number on the letters.   I let them know if they are unable to reach her for any reason by next week to let me know and I would be able to try and assist with connecting her with pt.   Octavio Graves, MSW, LCSW [facility] Health Heart/Vascular Care Navigation  [phone]

## 2020-05-19 ENCOUNTER — Telehealth: Payer: Self-pay | Admitting: Licensed Clinical Social Worker

## 2020-05-19 NOTE — Telephone Encounter (Signed)
Phone encounter opened in error.   Daijanae Rafalski, MSW, LCSW [place] Heart/Vascular Care Navigation  [phone]   

## 2020-05-21 ENCOUNTER — Telehealth: Payer: Self-pay

## 2020-05-21 ENCOUNTER — Telehealth: Payer: Self-pay | Admitting: Licensed Clinical Social Worker

## 2020-05-21 DIAGNOSIS — I5022 Chronic systolic (congestive) heart failure: Secondary | ICD-10-CM

## 2020-05-21 NOTE — Telephone Encounter (Signed)
CSW spoke with pt this morning, he spoke with his caseworker and had his questions answered regarding Medicaid. He had questions regarding his SNAP which I had been informed was approved earlier this month, but he has not received any paperwork etc back. I reached out to the [employer] who assisted w/ this application to see if she had any insight.  Cody Carpenter, MSW, LCSW [facility] Health Heart/Vascular Care Navigation  [phone]

## 2020-05-23 ENCOUNTER — Telehealth: Payer: Self-pay | Admitting: Cardiology

## 2020-05-23 ENCOUNTER — Telehealth: Payer: Self-pay | Admitting: Licensed Clinical Social Worker

## 2020-05-23 NOTE — Telephone Encounter (Signed)
1 bottle placed at front desk for pickup of Entresto 49-51.

## 2020-05-23 NOTE — Telephone Encounter (Signed)
Spoke with pt, he is still waiting on his cards for Medicaid and SNAP to arrive. Per Laurin Coder, with [facility], pt card had been mailed after 04/29/20 and may have had some delays due to the holiday season. A new additional SNAP card sent to [address], [place] [address]. Pt aware and will continue to look out for these documents. He is aware if he continues to not receive his Medicaid card/coverage information he needs to reach out to his caseworker with DSS. Pt has my number for any additional needs.   Octavio Graves, MSW, LCSW [facility] Health Heart/Vascular Care Navigation  [phone]

## 2020-05-23 NOTE — Telephone Encounter (Signed)
Patient calling the office for samples of medication:   1.  What medication and dosage are you requesting samples for? sacubitril-valsartan (ENTRESTO) 49-51 MG  2.  Are you currently out of this medication? No, states he has 2 tablets left and needs enough to make it to 05/28/2020.

## 2020-05-26 ENCOUNTER — Telehealth: Payer: Self-pay | Admitting: Cardiology

## 2020-05-26 NOTE — Telephone Encounter (Signed)
Left message for patient to call and discuss scheduling the Cardiac MRI ordered by Dr. Lambert 

## 2020-05-26 NOTE — Telephone Encounter (Signed)
Left detailed message for Pt advising cardiac MRI has been ordered.  Will schedule biv ICD after MRI.

## 2020-05-29 ENCOUNTER — Telehealth: Payer: Self-pay | Admitting: Licensed Clinical Social Worker

## 2020-05-29 NOTE — Telephone Encounter (Signed)
LCSW received a confirmation text message from family that shared that he has received his Medicaid card but he has yet to receive his SNAP card. CSW assisted with calling [facility] DSS. They shared that cards have usually been delivered 7-10 business days after being ordered. That means if the second card was ordered on 1/19 then pt should expect it between February 2nd-3rd. Pt aware and will reach out to this writer if by next Friday he still hasn't received it. He knows he should also speak with DSS and has their number. I also will try and connect pt with our scheduler to schedule the Cardiac MRI that Dr. Lalla Brothers has been trying to order for pt now that he has his Medicaid card.    Octavio Graves, MSW, LCSW [facility] Health Heart/Vascular Care Navigation  [phone]

## 2020-06-02 ENCOUNTER — Telehealth: Payer: Self-pay | Admitting: Licensed Clinical Social Worker

## 2020-06-02 NOTE — Telephone Encounter (Signed)
Pt contacted this Clinical research associate and provided his new phone number- [phone]. Updated chart, pt also provided DSS caseworkers number so he can update his number with them. No additional needs at this time.   Cody Carpenter, MSW, LCSW [facility] Health Heart/Vascular Care Navigation  [phone]

## 2020-06-03 ENCOUNTER — Telehealth: Payer: Self-pay | Admitting: Licensed Clinical Social Worker

## 2020-06-03 NOTE — Telephone Encounter (Signed)
Pt reached out to this writer inquiring if I had any information regarding the specifics of his disability determination as he is worried given all his upcoming potential procedures financially. LCSW inquired if he had reached out to DSS eligibility caseworker Norton Blizzard at number I had provided him at [facility]. Pt states he has but did not indicate if he mentioned that question to her. LCSW reached out personally to DSS eligibility caseworker. She states she has not heard from pt. I provided pt via text (w/ permission) the numbers provided to me from DSS. He was directed to contact the Social Security Administration for questions related to money dispersal and onset dates at [phone]; and for any questions related to Mediciad he can contact a Ms. Alan Ripper at [facility] Department of Social Services at [phone]. Remain available for any additional support as needed.   Cody Carpenter, MSW, LCSW [facility] Health Heart/Vascular Care Navigation  [phone]

## 2020-06-09 ENCOUNTER — Encounter: Payer: Self-pay | Admitting: Cardiology

## 2020-06-09 NOTE — Telephone Encounter (Signed)
Spoke with patient regarding the appointment scheduled 07/02/20 at 9:00 am at [facility] for the Cardiac MRI ordered by Dr. Lalla Brothers.  Arrival time is 8:30 am ist floor admissions office for check in.  Will mail information to patient and he voiced his understanding

## 2020-06-24 ENCOUNTER — Telehealth (HOSPITAL_COMMUNITY): Payer: Self-pay | Admitting: *Deleted

## 2020-06-24 NOTE — Telephone Encounter (Signed)
Reaching out to patient to offer assistance regarding upcoming cardiac imaging study; pt verbalizes understanding of appt date/time, parking situation and where to check in, and verified current allergies; name and call back number provided for further questions should they arise  Micah Barnier RN Navigator Cardiac Imaging [place] [facility] [phone] office [phone] cell  

## 2020-06-26 ENCOUNTER — Ambulatory Visit (HOSPITAL_COMMUNITY)
Admission: RE | Admit: 2020-06-26 | Discharge: 2020-06-26 | Disposition: A | Discharge status: Home / Self Care | Payer: Medicaid Other | Source: Ambulatory Visit | Attending: Cardiology | Admitting: Cardiology

## 2020-06-26 ENCOUNTER — Other Ambulatory Visit: Payer: Self-pay

## 2020-06-26 DIAGNOSIS — I5022 Chronic systolic (congestive) heart failure: Secondary | ICD-10-CM

## 2020-06-26 IMAGING — MR MR CARD MORPHOLOGY WO/W CM
45 of 48 series · 45 of 48 positions shown · IV contrast (gadavist)
Comparison: none

CLINICAL DATA: Cardiomyopathy of uncertain etiology

EXAM:
CARDIAC MRI
TECHNIQUE: The patient was scanned on a 1.5 Tesla GE magnet. A dedicated
cardiac coil was used. Functional imaging was done using Fiesta
sequences. [DATE], and 4 chamber views were done to assess for RWMA's.
Modified SAGASTUME rule using a short axis stack was used to
calculate an ejection fraction on a dedicated work station using
Circle software. The patient received 10 cc of Gadavist. After 10
minutes inversion recovery sequences were used to assess for
infiltration and scar tissue.

[Series 4: t2_haste_db_tra_bh · axial · 8.0mm · 1.41mm/px · 1 of 16 slices shown]
[im 1/16]
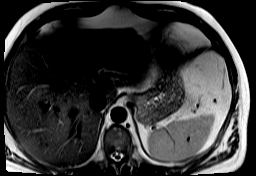

[Series 8: bSSFP · oblique · 8.0mm · 1.61mm/px · 1 of 25 slices shown (1 of 21)]
[im 1/25]
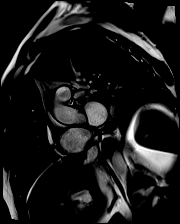

[Series 9: bSSFP · oblique · 8.0mm · 1.61mm/px · 1 of 25 slices shown (2 of 21)]
[im 1/25]
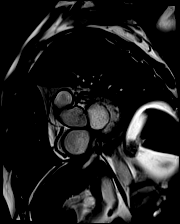

[Series 10: bSSFP · oblique · 8.0mm · 1.61mm/px · 1 of 25 slices shown (3 of 21)]
[im 1/25]
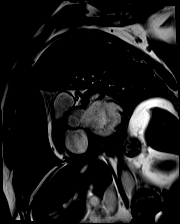

[Series 11: bSSFP · oblique · 8.0mm · 1.61mm/px · 1 of 25 slices shown (4 of 21)]
[im 1/25]
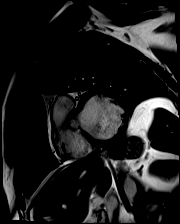

[Series 12: bSSFP · oblique · 8.0mm · 1.61mm/px · 1 of 25 slices shown (5 of 21)]
[im 1/25]
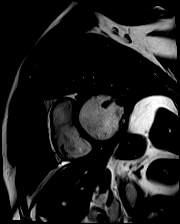

[Series 13: bSSFP · oblique · 8.0mm · 1.61mm/px · 1 of 25 slices shown (6 of 21)]
[im 1/25]
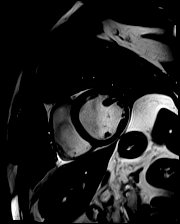

[Series 14: bSSFP · oblique · 8.0mm · 1.61mm/px · 1 of 25 slices shown (7 of 21)]
[im 1/25]
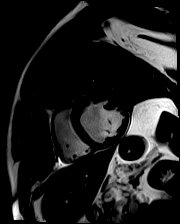

[Series 15: bSSFP · oblique · 8.0mm · 1.61mm/px · 1 of 25 slices shown (8 of 21)]
[im 1/25]
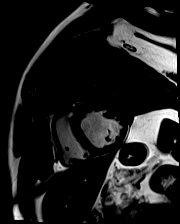

[Series 16: bSSFP · oblique · 8.0mm · 1.61mm/px · 1 of 25 slices shown (9 of 21)]
[im 1/25]
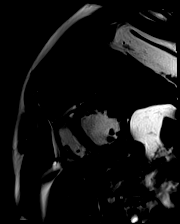

[Series 17: bSSFP · oblique · 8.0mm · 1.61mm/px · 1 of 25 slices shown (10 of 21)]
[im 1/25]
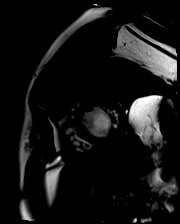

[Series 18: bSSFP · oblique · 8.0mm · 1.61mm/px · 1 of 25 slices shown (11 of 21)]
[im 1/25]
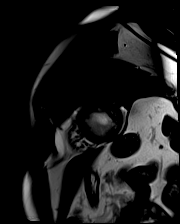

[Series 19: bSSFP · oblique · 8.0mm · 1.61mm/px · 1 of 25 slices shown (12 of 21)]
[im 1/25]
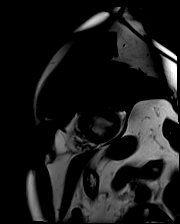

[Series 20: bSSFP · oblique · 8.0mm · 1.61mm/px · 1 of 25 slices shown (13 of 21)]
[im 1/25]
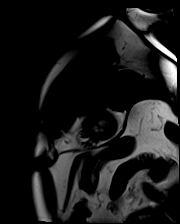

[Series 21: bSSFP · oblique · 8.0mm · 1.61mm/px · 1 of 25 slices shown (14 of 21)]
[im 1/25]
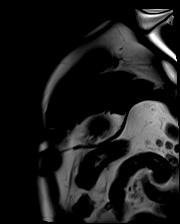

[Series 22: bSSFP · oblique · 8.0mm · 1.61mm/px · 1 of 25 slices shown (15 of 21)]
[im 1/25]
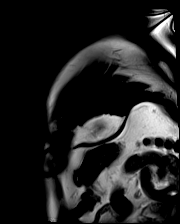

[Series 23: bSSFP · oblique · 8.0mm · 1.61mm/px · 1 of 25 slices shown (16 of 21)]
[im 1/25]
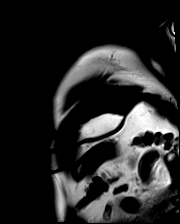

[Series 24: bSSFP · oblique · 8.0mm · 1.61mm/px · 1 of 25 slices shown (17 of 21)]
[im 1/25]
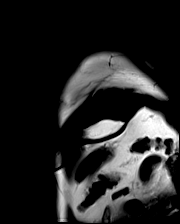

[Series 25: bSSFP · oblique · 6.0mm · 1.41mm/px · 1 of 25 slices shown (18 of 21)]
[im 1/25]
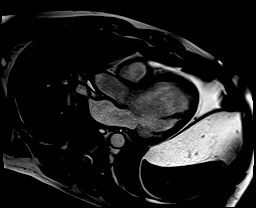

[Series 26: bSSFP · sagittal · 6.0mm · 1.41mm/px · 1 of 25 slices shown (19 of 21)]
[im 1/25]
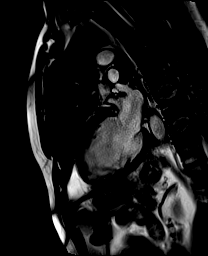

[Series 27: bSSFP · axial · 6.0mm · 1.41mm/px · 1 of 25 slices shown (20 of 21)]
[im 1/25]
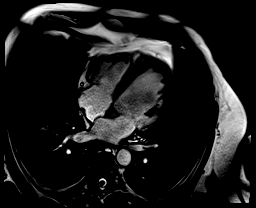

[Series 28: (id)_long_t1 · oblique · 8.0mm · 1.56mm/px · 1 of 24 slices shown]
[im 1/24]
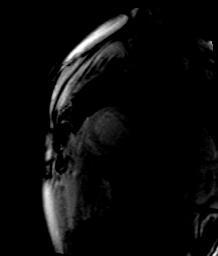

[Series 29: (id)_long_t1_moco · oblique · 8.0mm · 1.56mm/px · 1 of 24 slices shown]
[im 1/24]
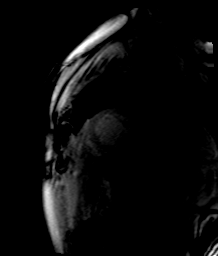

[Series 30: (id)_long_t1_moco_t1 · oblique · 8.0mm · 1.56mm/px · 1 of 6 slices shown]
[im 1/6]
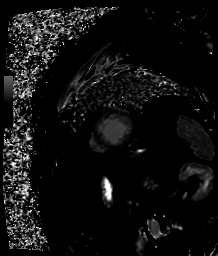

[Series 32: (id)_trufi · oblique · 8.0mm · 2.08mm/px · 1 of 9 slices shown]
[im 1/9]
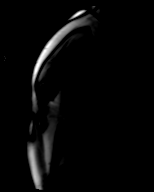

[Series 33: (id)_trufi_moco · oblique · 8.0mm · 2.08mm/px · 1 of 9 slices shown]
[im 1/9]
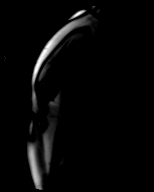

[Series 34: (id)_trufi_moco_t2 · oblique · 8.0mm · 2.08mm/px · 1 of 3 slices shown]
[im 1/3]
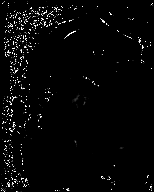

[Series 36: pre short axis · oblique · non-contrast · 8.0mm · 2.25mm/px · 1 of 10 slices shown (1 of 6)]
[im 1/10]
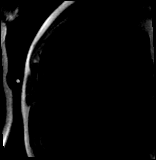

[Series 37: pre short axis · oblique · non-contrast · 8.0mm · 2.25mm/px · 1 of 10 slices shown (2 of 6)]
[im 1/10]
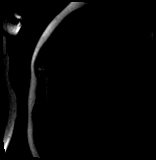

[Series 38: pre short axis · oblique · non-contrast · 8.0mm · 2.25mm/px · 1 of 10 slices shown (3 of 6)]
[im 1/10]
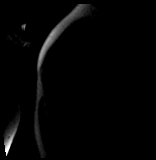

[Series 39: pre short axis · oblique · non-contrast · 8.0mm · 2.25mm/px · 1 of 10 slices shown (4 of 6)]
[im 1/10]
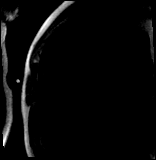

[Series 40: pre short axis · oblique · non-contrast · 8.0mm · 2.25mm/px · 1 of 10 slices shown (5 of 6)]
[im 1/10]
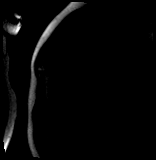

[Series 41: pre short axis · oblique · non-contrast · 8.0mm · 2.25mm/px · 1 of 10 slices shown (6 of 6)]
[im 1/10]
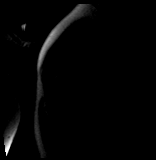

[Series 42: rest short axis · oblique · 8.0mm · 2.25mm/px · 1 of 80 slices shown (1 of 6)]
[im 1/80]
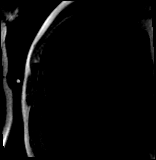

[Series 43: rest short axis · oblique · 8.0mm · 2.25mm/px · 1 of 80 slices shown (2 of 6)]
[im 1/80]
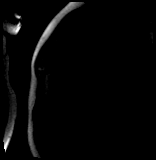

[Series 44: rest short axis · oblique · 8.0mm · 2.25mm/px · 1 of 80 slices shown (3 of 6)]
[im 1/80]
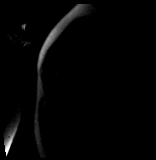

[Series 45: rest short axis · oblique · 8.0mm · 2.25mm/px · 1 of 80 slices shown (4 of 6)]
[im 1/80]
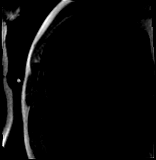

[Series 46: rest short axis · oblique · 8.0mm · 2.25mm/px · 1 of 80 slices shown (5 of 6)]
[im 1/80]
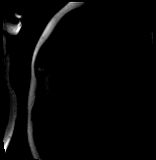

[Series 47: rest short axis · oblique · 8.0mm · 2.25mm/px · 1 of 80 slices shown (6 of 6)]
[im 1/80]
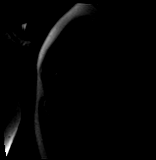

[Series 49: cine rvit · oblique · 6.0mm · 1.41mm/px · 1 of 25 slices shown]
[im 1/25]
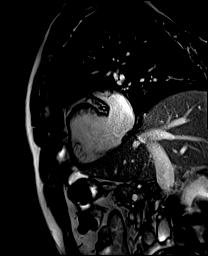

[Series 51: cine rvot · sagittal · 6.0mm · 1.41mm/px · 1 of 25 slices shown]
[im 1/25]
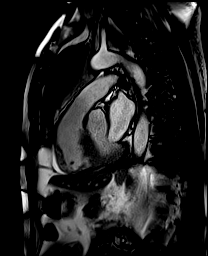

[Series 52: bSSFP · coronal · 6.0mm · 1.41mm/px · 1 of 25 slices shown (21 of 21)]
[im 1/25]
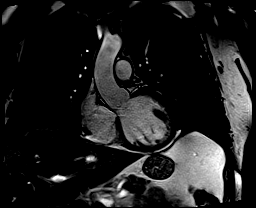

[Series 53: aortic valve cine · coronal · 6.0mm · 1.41mm/px · 1 of 25 slices shown (1 of 2)]
[im 1/25]
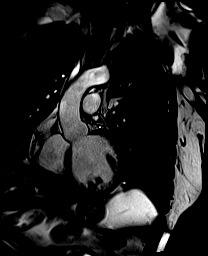

[Series 55: aortic valve cine · oblique · 6.0mm · 1.41mm/px · 1 of 25 slices shown (2 of 2)]
[im 1/25]
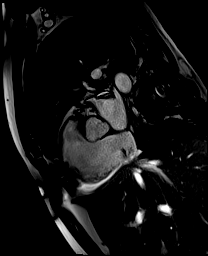

[Series 56: lge_single shot sa · oblique · 8.0mm · 2.08mm/px · 1 of 18 slices shown]
[im 1/18]
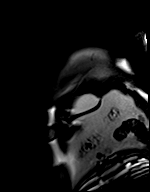

[45 of 48 positions shown; findings below may reference images not displayed]

FINDINGS: Moderately dilated left ventricle with normal wall thickness. No
evidence for LV noncompaction. Diffuse hypokinesis with
septal-lateral dyssynchrony consistent with LBBB, EF 26%. Normal
right ventricular size with EF 32%, moderately decreased. Normal
left and right atrial sizes. Trileaflet aortic valve with no
stenosis or regurgitation. Trivial mitral regurgitation.

On delayed enhancement imaging, there is basal to mid inferoseptal
mid-wall late gadolinium enhancement (LGE). There is basal
anteroseptal mid-wall LGE.

Measurements:

LVEDV 273 mL
LVSV 70 mL
LVEF 26%

RVEDV 141 mL
RVSV 96 mL
RVEF 32%
IMPRESSION: 1. Moderately dilated LV with normal wall thickness. EF 26% with
global hypokinesis, septal-lateral dyssynchrony consistent with
LBBB.

2. Normal RV size with moderately decreased systolic function, EF
32%.

3. LGE pattern is not suggestive of coronary disease. Most
consistent with prior myocarditis.

SAGASTUME

## 2020-06-26 MED ORDER — GADOBUTROL 1 MMOL/ML IV SOLN
10.0000 mL | Freq: Once | INTRAVENOUS | Status: AC | PRN
  Administered 2020-06-26: 10 mL via INTRAVENOUS

## 2020-07-02 ENCOUNTER — Ambulatory Visit (HOSPITAL_COMMUNITY): Admission: RE | Admit: 2020-07-02 | Payer: Medicaid Other | Source: Ambulatory Visit

## 2020-07-11 ENCOUNTER — Telehealth: Payer: Self-pay | Admitting: *Deleted

## 2020-07-11 NOTE — Telephone Encounter (Signed)
Left message to bring his CPAP machine to his appointment to see Dr Tresa Endo.

## 2020-07-14 ENCOUNTER — Other Ambulatory Visit: Payer: Self-pay

## 2020-07-14 ENCOUNTER — Encounter: Payer: Self-pay | Admitting: Cardiovascular Disease

## 2020-07-14 ENCOUNTER — Ambulatory Visit (INDEPENDENT_AMBULATORY_CARE_PROVIDER_SITE_OTHER): Payer: Medicaid Other | Admitting: Cardiovascular Disease

## 2020-07-14 VITALS — BP 104/76 | HR 72 | Ht 67.0 in | Wt 228.4 lb

## 2020-07-14 DIAGNOSIS — I5189 Other ill-defined heart diseases: Secondary | ICD-10-CM

## 2020-07-14 DIAGNOSIS — G4733 Obstructive sleep apnea (adult) (pediatric): Secondary | ICD-10-CM

## 2020-07-14 DIAGNOSIS — I420 Dilated cardiomyopathy: Secondary | ICD-10-CM

## 2020-07-14 DIAGNOSIS — I5042 Chronic combined systolic (congestive) and diastolic (congestive) heart failure: Secondary | ICD-10-CM | POA: Diagnosis not present

## 2020-07-14 DIAGNOSIS — R002 Palpitations: Secondary | ICD-10-CM | POA: Diagnosis not present

## 2020-07-14 DIAGNOSIS — G4730 Sleep apnea, unspecified: Secondary | ICD-10-CM

## 2020-07-14 NOTE — Progress Notes (Signed)
Cardiology Office Note    Date:  07/21/2020   ID:  JAN OLANO, DOB [birth year], MRN [id number]  PCP:  Patient, No Pcp Per  Cardiologist:  Shelva Majestic, MD (sleep); Dr. Percival Spanish  F/U sleep evaluation with me  History of Present Illness:  Cody Carpenter is a 55 y.o. male who is followed by Dr. Percival Spanish for cardiology care.  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  LV function improved with medical therapy and with subsequent discontinuance of medical therapy his heart function again reduced to 20 to 25%.  He has had issues with lack of insurance.  He is applying for Medicaid.  Most recently he has been treated with carvedilol 25 mg twice a day, Entresto 49/51 mg twice a day and spironolactone 25 mg.  He was evaluated by Dr. Quentin Ore for consideration of possible ICD implant.  He is waiting for Medicaid approval prior to proceeding.  The patient has a longstanding history of sleep apnea which was originally diagnosed in 2008.  He initially had seen Dr. Danton Sewer and apparently his sleep apnea was severe and he was started on CPAP therapy.  Due to lack of insurance, patient never followed up but last saw Dr. Normajean Baxter in 2015.  At that time he had not had any new supplies for over 3 to 4 years.  There was discussion about follow-up evaluation but apparently this never evolved.  I last saw him in December 2021. He still had his original old CPAP machine which is an Air cabin crew 2.  Apparently he does not have any headgear or tubing.  He cannot afford another sleep evaluation presently or even if bought online rather than through the DME company.  He typically goes to bed at 10 PM and usually is awake within several hours.  His sleep is poor.  He typically is out of bed at 5 AM.  He admits to snoring, occasional nocturia, his sleep is nonrestorative.  An Epworth Sleepiness Scale was calculated in the office today and this endorsed at 15 as shown  below:   Epworth Sleepiness Scale: Situation   Chance of Dozing/Sleeping (0 = never , 1 = slight chance , 2 = moderate chance , 3 = high chance )   sitting and reading 0   watching TV 3   sitting inactive in a public place 3   being a passenger in a motor vehicle for an hour or more 3   lying down in the afternoon 3   sitting and talking to someone 0   sitting quietly after lunch (no alcohol) 3   while stopped for a few minutes in traffic as the driver 0   Total Score  15   He was unaware of any bruxism, restless legs, hypnagogic hallucinations or cataplectic events.  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  He had not had new supplies in years and could not afford any supplies even if bought online at reduced cost or obtain a new CPAP unit.  As result I provided him with samples of a new ResMed AirFit F30 medium mask as well as new CPAP tubing.  Ultimately I felt he would benefit significant with a new CPAP or BiPAP machine.  I had a lengthy discussion again with him regarding effects of untreated sleep apnea and his cardiovascular health.  Since my December 2021 evaluation, he apparently has now  received Medicaid.  He continues to sleep very poorly.  He typically tries to go to bed between 10 and 1030 and often awakens at 2 AM and cannot really fall back to sleep.  He ultimately gets out of bed at 7:30 AM. He often wakes up at 2 AM and sleeps poorly until he is out of bed at 5:30 AM.  He presents for reevaluation.  Past Medical History:  Diagnosis Date  . CHF (congestive heart failure) ([place])   . Hypertension   . Sleep apnea    CPAP    Past Surgical History:  Procedure Laterality Date  . NO PAST SURGERIES      Current Medications: Outpatient Medications Prior to Visit  Medication Sig Dispense Refill  . carvedilol (COREG) 25 MG tablet Take 1 tablet (25 mg total) by mouth 2 (two) times daily with a meal. 180 tablet 3  .  sacubitril-valsartan (ENTRESTO) 49-51 MG Take 1 tablet by mouth 2 (two) times daily. Please make annual appt in April for refills. Thank you 180 tablet 0  . spironolactone (ALDACTONE) 25 MG tablet TAKE 1 TABLET BY MOUTH AT BEDTIME. 90 tablet 3   No facility-administered medications prior to visit.     Allergies:   Patient has no known allergies.   Social History   Socioeconomic History  . Marital status: Single    Spouse name: Not on file  . Number of children: 3  . Years of education: Not on file  . Highest education level: Not on file  Occupational History  . Occupation: roofer  Tobacco Use  . Smoking status: Current Every Day Smoker    Packs/day: 1.00    Years: 25.00    Pack years: 25.00    Types: Cigarettes  . Smokeless tobacco: Never Used  Substance and Sexual Activity  . Alcohol use: Yes  . Drug use: No  . Sexual activity: Not on file  Other Topics Concern  . Not on file  Social History Narrative  . Not on file   Social Determinants of Health   Financial Resource Strain: High Risk  . Difficulty of Paying Living Expenses: Very hard  Food Insecurity: Food Insecurity Present  . Worried About Charity fundraiser in the Last Year: Sometimes true  . Ran Out of Food in the Last Year: Sometimes true  Transportation Needs: No Transportation Needs  . Lack of Transportation (Medical): No  . Lack of Transportation (Non-Medical): No  Physical Activity: Not on file  Stress: Not on file  Social Connections: Not on file    Social history is notable that he is single.  He never married.  He has 2 grown children ages 21 and 25.  He previously worked in the [employer].  He lives by himself.  He was born in [place].  Family History:  The patient's family history includes Heart disease in his maternal grandfather; Lung cancer in his maternal grandmother.  His father was killed at age 72.  His mother died of pneumonia at age 22.  He has 1 sister age 13.  ROS General:  Negative; No fevers, chills, or night sweats;  HEENT: Negative; No changes in vision or hearing, sinus congestion, difficulty swallowing Pulmonary: Negative; No cough, wheezing, shortness of breath, hemoptysis Cardiovascular: See HPI, nonischemic cardiomyopathy, GI: Negative; No nausea, vomiting, diarrhea, or abdominal pain GU: Negative; No dysuria, hematuria, or difficulty voiding Musculoskeletal: Negative; no myalgias, joint pain, or weakness Hematologic/Oncology: Negative; no easy bruising, bleeding Endocrine: Negative; no heat/cold intolerance;  no diabetes Neuro: Negative; no changes in balance, headaches Skin: Negative; No rashes or skin lesions Psychiatric: Negative; No behavioral problems, depression Sleep: Severe OSA on initial sleep study in 2008.  He has an old S8 ResMed Elite 2 CPAP unit; poor sleep, frequent awakenings, snoring, daytime sleepiness  Other comprehensive 14 point system review is negative.   PHYSICAL EXAM:   VS:  BP 104/76 (BP Location: Left Arm, Patient Position: Sitting)   Pulse 72   Ht '5\' 7"'  (1.702 m)   Wt 228 lb 6.4 oz (103.6 kg)   SpO2 96%   BMI 35.77 kg/m    Wt Readings from Last 3 Encounters:  07/14/20 228 lb 6.4 oz (103.6 kg)  05/06/20 228 lb (103.4 kg)  04/23/20 227 lb (103 kg)    General: Alert, oriented, no distress.  Skin: normal turgor, no rashes, warm and dry HEENT: Normocephalic, atraumatic. Pupils equal round and reactive to light; sclera anicteric; extraocular muscles intact;  Nose without nasal septal hypertrophy Mouth/Parynx benign; Mallinpatti scale 3 Neck: No JVD, no carotid bruits; normal carotid upstroke Lungs: clear to ausculatation and percussion; no wheezing or rales Chest wall: without tenderness to palpitation Heart: PMI not displaced, RRR, s1 s2 normal, 1/6 systolic murmur, no diastolic murmur, no rubs, gallops, thrills, or heaves Abdomen: soft, nontender; no hepatosplenomehaly, BS+; abdominal aorta nontender and not  dilated by palpation. Back: no CVA tenderness Pulses 2+ Musculoskeletal: full range of motion, normal strength, no joint deformities Extremities: no clubbing cyanosis or edema, Homan's sign negative  Neurologic: grossly nonfocal; Cranial nerves grossly wnl Psychologic: Normal mood and affect   Studies/Labs Reviewed:   EKG:  EKG is  ordered today. ECG (independently read by me): NSR at 72; ST changes inferolaterally; QTc 466   I personally reviewed his last ECG of January 08, 2020 which showed sinus rhythm at 75 bpm, left bundle branch block, QRS duration 152 ms.  No ectopy.  Recent Labs: BMP Latest Ref Rng & Units 12/17/2019 10/08/2019 06/29/2018  Glucose 65 - 99 mg/dL 92 96 98  BUN 6 - 24 mg/dL 16 19 22(H)  Creatinine 0.76 - 1.27 mg/dL 0.99 0.94 1.06  BUN/Creat Ratio 9 - '20 16 20 ' -  Sodium 134 - 144 mmol/L 140 139 137  Potassium 3.5 - 5.2 mmol/L 4.6 4.6 4.3  Chloride 96 - 106 mmol/L 104 103 109  CO2 20 - 29 mmol/L 22 21 21(L)  Calcium 8.7 - 10.2 mg/dL 9.5 9.3 9.1     Hepatic Function Latest Ref Rng & Units 06/06/2014 03/02/2007  Total Protein 6.0 - 8.3 g/dL 8.0 6.5  Albumin 3.5 - 5.2 g/dL 3.8 3.5  AST 0 - 37 U/L 22 24  ALT 0 - 53 U/L 24 28  Alk Phosphatase 39 - 117 U/L 84 68  Total Bilirubin 0.3 - 1.2 mg/dL 0.5 0.7    CBC Latest Ref Rng & Units 10/08/2019 06/06/2014 03/06/2007  WBC 3.4 - 10.8 x10E3/uL 6.7 8.7 7.9  Hemoglobin 13.0 - 17.7 g/dL 13.8 14.6 15.4  Hematocrit 37.5 - 51.0 % 42.0 43.8 45.7  Platelets 150 - 450 x10E3/uL 167 211 203   Lab Results  Component Value Date   MCV 93 10/08/2019   MCV 89.0 06/06/2014   MCV 93.4 03/06/2007   Lab Results  Component Value Date   TSH 1.610 10/08/2019   No results found for: HGBA1C   BNP    Component Value Date/Time   BNP 97.8 12/23/2017 1535    ProBNP  Component Value Date/Time   PROBNP <30.0 03/07/2007 0330     Lipid Panel     Component Value Date/Time   CHOL  03/02/2007 0838    170        ATP III  CLASSIFICATION:  <200     mg/dL   Desirable  200-239  mg/dL   Borderline High  >=240    mg/dL   High   TRIG 290 (H) 03/02/2007 0838   HDL 26 (L) 03/02/2007 0838   CHOLHDL 6.5 03/02/2007 0838   VLDL 58 (H) 03/02/2007 0838   LDLCALC  03/02/2007 0838    86        Total Cholesterol/HDL:CHD Risk Coronary Heart Disease Risk Table                     Men   Women  1/2 Average Risk   3.4   3.3     RADIOLOGY: MR Card Morphology Wo/W Cm  Result Date: 06/27/2020 CLINICAL DATA:  Cardiomyopathy of uncertain etiology EXAM: CARDIAC MRI TECHNIQUE: The patient was scanned on a 1.5 Tesla GE magnet. A dedicated cardiac coil was used. Functional imaging was done using Fiesta sequences. 2,3, and 4 chamber views were done to assess for RWMA's. Modified Simpson's rule using a short axis stack was used to calculate an ejection fraction on a dedicated work Conservation officer, nature. The patient received 10 cc of Gadavist. After 10 minutes inversion recovery sequences were used to assess for infiltration and scar tissue. FINDINGS: Moderately dilated left ventricle with normal wall thickness. No evidence for LV noncompaction. Diffuse hypokinesis with septal-lateral dyssynchrony consistent with LBBB, EF 26%. Normal right ventricular size with EF 32%, moderately decreased. Normal left and right atrial sizes. Trileaflet aortic valve with no stenosis or regurgitation. Trivial mitral regurgitation. On delayed enhancement imaging, there is basal to mid inferoseptal mid-wall late gadolinium enhancement (LGE). There is basal anteroseptal mid-wall LGE. Measurements: LVEDV 273 mL LVSV 70 mL LVEF 26% RVEDV 141 mL RVSV 96 mL RVEF 32% IMPRESSION: 1. Moderately dilated LV with normal wall thickness. EF 26% with global hypokinesis, septal-lateral dyssynchrony consistent with LBBB. 2. Normal RV size with moderately decreased systolic function, EF 51%. 3. LGE pattern is not suggestive of coronary disease. Most consistent with prior  myocarditis. Dalton Mclean Electronically Signed   By: Loralie Champagne M.D.   On: 06/27/2020 12:41     Additional studies/ records that were reviewed today include:  I reviewed the records of Dr. Gwenette Greet in 2015 as well as Drs Percival Spanish, and Dr. Quentin Ore  ASSESSMENT:    1. OSA (obstructive sleep apnea)   2. Chronic combined systolic and diastolic heart failure (HCC)   3. Palpitations   4. Dilated cardiomyopathy ([place])   5. Grade III diastolic dysfunction     PLAN:  Mr. Cas Tracz is a 55 year old gentleman who has a history of presumed nonischemic cardiomyopathy documented since 2008.  His most recent echo on January 03, 2020 showed an EF of 20 to 25% with restrictive physiology. he is on guideline directed medical therapy.  He has left bundle branch block and has seen Dr. Charlett Lango for consideration of CRT-D which has been deferred until he gets approved for Medicaid with also plan for cardiac MRI.  He ha a history of severe sleep apnea originally diagnosed in 2008 and has an old Interior and spatial designer from 2006.  When I saw him in December 2021 he could not afford supplies and I provided him  with a new ResMed air fit F 30 medium mask as well as new CPAP tubing.  He now has received Medicaid.  His sleep has been very poor and he continues to have severe daytime sleepiness with an Epworth Sleepiness Scale score calculated at 24 today.  Since he now is on Medicaid, we will schedule him for a but night sleep study further evaluation and treatment of his severe sleep apnea.  He is aware of the importance of resuming therapy particularly with his cardiovascular health and his cardiomyopathy.  I will see him in several months in follow-up of the above study and after he receives a new machine and further recommendations will be made at that time.    Medication Adjustments/Labs and Tests Ordered: Current medicines are reviewed at length with the patient today.  Concerns regarding medicines are  outlined above.  Medication changes, Labs and Tests ordered today are listed in the Patient Instructions below. Patient Instructions  Medication Instructions:  No changes  *If you need a refill on your cardiac medications before your next appointment, please call your pharmacy*   Lab Work:  Not needed   Testing/Procedures:  Will be  Schedule at [facility]   Address: [address], [place], [place] [address] Phone: [phone] Your physician has recommended that you have a  ( split - night if possible) sleep study. This test records several body functions during sleep, including: brain activity, eye movement, oxygen and carbon dioxide blood levels, heart rate and rhythm, breathing rate and rhythm, the flow of air through your mouth and nose, snoring, body muscle movements, and chest and belly movement.   Follow-Up: At [facility], you and your health needs are our priority.  As part of our continuing mission to provide you with exceptional heart care, we have created designated Provider Care Teams.  These Care Teams include your primary Cardiologist (physician) and Advanced Practice Providers (APPs -  Physician Assistants and Nurse Practitioners) who all work together to provide you with the care you need, when you need it.     Your next appointment:   3 month(s) Sleep clinic  The format for your next appointment:   In Person  Provider:   Shelva Majestic, MD   Other Instructions  Sleep Study  LADARRIAN ASENCIO scheduled for a sleep study on Tuesday May 03 at 8 o'clock. You will leave next morning between 5:00 a.m - 7:00 a.m.   [facility]   Address: [address], [place], [place] [address] Phone: [phone]   The sleep study consists of a recording of your brain waves (EEG). Breathing, heart rate and rhythm (ECG), oxygen level, eye movement, and leg movement.  The technician will glue or or paste several electrodes to your  scalp, face, chest and legs.  You will have belts around your chest and abdomen to record breathing and a finger clasp to check blood oxygen levels.  A tube at your mouth and nose will detect airflow.  There are no needle sticks or painful procedures of any sort.  You will have your own room, and we will make every effort to attend to your comfort and privacy.  Please prepare for your study by the following steps:   Please avoid coffee, tea, soda, chocolate and other caffeine foods or beverages after 12:00 noon on the day of your sleep study.   You must arrive with clean (no oils), conditioners or make up,  and please make sure that you wash your hair to ensure that your hair and scalp are clean, dry and free of any hair extensions on the day of your study.  This will help to get a good reading of study.  Please try not to nap on the day of your study.  Please bring a list of all your medications.  Bring any medications that you might need during the time you are within the laboratory, including insulin, sleeping pills, pain medication and anxiety medications.  Bring snacks, water or juice  Please bring clothes to sleep in and your normal overnight bag.  Please leave valuable at home, as we will not be responsible for any lost items.  If you have any further questions, please feel free to call our office. Thank you  Please call our office 48 in advance to cancel or reschedule to avoid a $100.00 early cancel or no show fee     Signed, Shelva Majestic, MD  07/21/2020 1:41 PM    [facility] [address] [place], [place], [place]  [address] Phone: [phone]

## 2020-07-14 NOTE — Patient Instructions (Addendum)
Medication Instructions:  No changes  *If you need a refill on your cardiac medications before your next appointment, please call your pharmacy*   Lab Work:  Not needed   Testing/Procedures:  Will be  Schedule at [facility]   Address: [address], [place], [place] [address] Phone: [phone] Your physician has recommended that you have a  ( split - night if possible) sleep study. This test records several body functions during sleep, including: brain activity, eye movement, oxygen and carbon dioxide blood levels, heart rate and rhythm, breathing rate and rhythm, the flow of air through your mouth and nose, snoring, body muscle movements, and chest and belly movement.   Follow-Up: At [facility], you and your health needs are our priority.  As part of our continuing mission to provide you with exceptional heart care, we have created designated Provider Care Teams.  These Care Teams include your primary Cardiologist (physician) and Advanced Practice Providers (APPs -  Physician Assistants and Nurse Practitioners) who all work together to provide you with the care you need, when you need it.     Your next appointment:   3 month(s) Sleep clinic  The format for your next appointment:   In Person  Provider:   Nicki Guadalajara, MD   Other Instructions  Sleep Study  Cody Carpenter scheduled for a sleep study on Tuesday May 03 at 8 o'clock. You will leave next morning between 5:00 a.m - 7:00 a.m.   [facility]   Address: [address], [place], [place] [address] Phone: [phone]   The sleep study consists of a recording of your brain waves (EEG). Breathing, heart rate and rhythm (ECG), oxygen level, eye movement, and leg movement.  The technician will glue or or paste several electrodes to your scalp, face, chest and legs.  You will have belts around your chest and abdomen to record breathing and a finger clasp to check blood oxygen  levels.  A tube at your mouth and nose will detect airflow.  There are no needle sticks or painful procedures of any sort.  You will have your own room, and we will make every effort to attend to your comfort and privacy.  Please prepare for your study by the following steps:   Please avoid coffee, tea, soda, chocolate and other caffeine foods or beverages after 12:00 noon on the day of your sleep study.   You must arrive with clean (no oils), conditioners or make up, and please make sure that you wash your hair to ensure that your hair and scalp are clean, dry and free of any hair extensions on the day of your study.  This will help to get a good reading of study.  Please try not to nap on the day of your study.  Please bring a list of all your medications.  Bring any medications that you might need during the time you are within the laboratory, including insulin, sleeping pills, pain medication and anxiety medications.  Bring snacks, water or juice  Please bring clothes to sleep in and your normal overnight bag.  Please leave valuable at home, as we will not be responsible for any lost items.  If you have any further questions, please feel free to call our office. Thank you  Please call our office 48 in advance to cancel or reschedule to avoid a $100.00 early cancel or no show fee

## 2020-07-15 ENCOUNTER — Telehealth: Payer: Self-pay | Admitting: *Deleted

## 2020-07-15 NOTE — Telephone Encounter (Signed)
Faxed PA request to [facility] for in lab sleep study.

## 2020-07-21 ENCOUNTER — Encounter: Payer: Self-pay | Admitting: Cardiovascular Disease

## 2020-07-22 ENCOUNTER — Other Ambulatory Visit: Payer: Self-pay

## 2020-07-22 ENCOUNTER — Ambulatory Visit: Payer: Medicaid Other | Admitting: Cardiology

## 2020-07-22 ENCOUNTER — Encounter: Payer: Self-pay | Admitting: *Deleted

## 2020-07-22 ENCOUNTER — Encounter: Payer: Self-pay | Admitting: Cardiology

## 2020-07-22 VITALS — BP 110/70 | HR 62 | Ht 67.0 in | Wt 229.0 lb

## 2020-07-22 DIAGNOSIS — I5042 Chronic combined systolic (congestive) and diastolic (congestive) heart failure: Secondary | ICD-10-CM | POA: Diagnosis not present

## 2020-07-22 DIAGNOSIS — I447 Left bundle-branch block, unspecified: Secondary | ICD-10-CM

## 2020-07-22 DIAGNOSIS — I4729 Other ventricular tachycardia: Secondary | ICD-10-CM

## 2020-07-22 DIAGNOSIS — I10 Essential (primary) hypertension: Secondary | ICD-10-CM

## 2020-07-22 DIAGNOSIS — I472 Ventricular tachycardia: Secondary | ICD-10-CM | POA: Diagnosis not present

## 2020-07-22 LAB — BASIC METABOLIC PANEL
BUN/Creatinine Ratio: 16 (ref 9–20)
BUN: 15 mg/dL (ref 6–24)
CO2: 19 mmol/L — ABNORMAL LOW (ref 20–29)
Calcium: 9.1 mg/dL (ref 8.7–10.2)
Chloride: 103 mmol/L (ref 96–106)
Creatinine, Ser: 0.96 mg/dL (ref 0.76–1.27)
Glucose: 82 mg/dL (ref 65–99)
Potassium: 4.5 mmol/L (ref 3.5–5.2)
Sodium: 138 mmol/L (ref 134–144)
eGFR: 95 mL/min/{1.73_m2} (ref >59)

## 2020-07-22 LAB — CBC WITH DIFFERENTIAL/PLATELET
Basophils Absolute: 0.1 10*3/uL (ref 0.0–0.2)
Basos: 1 %
EOS (ABSOLUTE): 0.5 10*3/uL — ABNORMAL HIGH (ref 0.0–0.4)
Eos: 6 %
Hematocrit: 43.9 % (ref 37.5–51.0)
Hemoglobin: 14.6 g/dL (ref 13.0–17.7)
Immature Grans (Abs): 0 10*3/uL (ref 0.0–0.1)
Immature Granulocytes: 0 %
Lymphocytes Absolute: 2.2 10*3/uL (ref 0.7–3.1)
Lymphs: 27 %
MCH: 31.3 pg (ref 26.6–33.0)
MCHC: 33.3 g/dL (ref 31.5–35.7)
MCV: 94 fL (ref 79–97)
Monocytes Absolute: 0.6 10*3/uL (ref 0.1–0.9)
Monocytes: 8 %
Neutrophils Absolute: 4.7 10*3/uL (ref 1.4–7.0)
Neutrophils: 58 %
Platelets: 192 10*3/uL (ref 150–450)
RBC: 4.67 x10E6/uL (ref 4.14–5.80)
RDW: 12.9 % (ref 11.6–15.4)
WBC: 8.1 10*3/uL (ref 3.4–10.8)

## 2020-07-22 NOTE — H&P (View-Only) (Signed)
Electrophysiology Office Follow up Visit Note:    Date:  07/22/2020   ID:  Cody Carpenter, DOB 1965/11/03, MRN [id number]  PCP:  Patient, No Pcp Per  [facility] Cardiologist:  Rollene Rotunda, MD  [facility] Electrophysiologist:  Lanier Prude, MD    Interval History:    Cody Carpenter is a 55 y.o. male who presents for a follow up visit.  I last saw the patient April 23, 2020 for chronic combined systolic and diastolic heart failure.  Since I last saw him he has been able to have his cardiac MRI.  Since I last saw him he tells me that he continues to struggle with fatigue, exercise intolerance.    Past Medical History:  Diagnosis Date  . CHF (congestive heart failure) (HCC)   . Hypertension   . Sleep apnea    CPAP    Past Surgical History:  Procedure Laterality Date  . NO PAST SURGERIES      Current Medications: Current Meds  Medication Sig  . carvedilol (COREG) 25 MG tablet Take 1 tablet (25 mg total) by mouth 2 (two) times daily with a meal.  . sacubitril-valsartan (ENTRESTO) 49-51 MG Take 1 tablet by mouth 2 (two) times daily. Please make annual appt in April for refills. Thank you  . spironolactone (ALDACTONE) 25 MG tablet TAKE 1 TABLET BY MOUTH AT BEDTIME.     Allergies:   Patient has no known allergies.   Social History   Socioeconomic History  . Marital status: Single    Spouse name: Not on file  . Number of children: 3  . Years of education: Not on file  . Highest education level: Not on file  Occupational History  . Occupation: roofer  Tobacco Use  . Smoking status: Current Every Day Smoker    Packs/day: 1.00    Years: 25.00    Pack years: 25.00    Types: Cigarettes  . Smokeless tobacco: Never Used  Substance and Sexual Activity  . Alcohol use: Yes  . Drug use: No  . Sexual activity: Not on file  Other Topics Concern  . Not on file  Social History Narrative  . Not on file   Social Determinants of Health   Financial Resource  Strain: High Risk  . Difficulty of Paying Living Expenses: Very hard  Food Insecurity: Food Insecurity Present  . Worried About Programme researcher, broadcasting/film/video in the Last Year: Sometimes true  . Ran Out of Food in the Last Year: Sometimes true  Transportation Needs: No Transportation Needs  . Lack of Transportation (Medical): No  . Lack of Transportation (Non-Medical): No  Physical Activity: Not on file  Stress: Not on file  Social Connections: Not on file     Family History: The patient's family history includes Heart disease in his maternal grandfather; Lung cancer in his maternal grandmother.  ROS:   Please see the history of present illness.    All other systems reviewed and are negative.  EKGs/Labs/Other Studies Reviewed:    The following studies were reviewed today:  06/26/2020 MRI Cardiac IMPRESSION: 1. Moderately dilated LV with normal wall thickness. EF 26% with global hypokinesis, septal-lateral dyssynchrony consistent with LBBB. 2. Normal RV size with moderately decreased systolic function, EF 32%. 3. LGE pattern is not suggestive of coronary disease. Most consistent with prior myocarditis.  On delayed enhancement imaging, there is basal to mid inferoseptal mid-wall late gadolinium enhancement (LGE). There is basal anteroseptal mid-wall LGE.  EKG:  The ekg ordered today demonstrates sinus rhythm, left bundle branch block with a QRS duration 156 ms  Recent Labs: 10/08/2019: Hemoglobin 13.8; Platelets 167; TSH 1.610 12/17/2019: BUN 16; Creatinine, Ser 0.99; Magnesium 2.0; Potassium 4.6; Sodium 140  Recent Lipid Panel    Component Value Date/Time   CHOL  03/02/2007 0838    170        ATP III CLASSIFICATION:  <200     mg/dL   Desirable  569-794  mg/dL   Borderline High  >=801    mg/dL   High   TRIG 655 (H) 37/48/2707 0838   HDL 26 (L) 03/02/2007 0838   CHOLHDL 6.5 03/02/2007 0838   VLDL 58 (H) 03/02/2007 0838   LDLCALC  03/02/2007 0838    86        Total  Cholesterol/HDL:CHD Risk Coronary Heart Disease Risk Table                     Men   Women  1/2 Average Risk   3.4   3.3    Physical Exam:    VS:  BP 110/70   Pulse 62   Ht 5\' 7"  (1.702 m)   Wt 229 lb (103.9 kg)   SpO2 97%   BMI 35.87 kg/m     Wt Readings from Last 3 Encounters:  07/22/20 229 lb (103.9 kg)  07/14/20 228 lb 6.4 oz (103.6 kg)  05/06/20 228 lb (103.4 kg)     GEN:  Well nourished, well developed in no acute distress HEENT: Normal NECK: No JVD; No carotid bruits LYMPHATICS: No lymphadenopathy CARDIAC: RRR, no murmurs, rubs, gallops RESPIRATORY:  Clear to auscultation without rales, wheezing or rhonchi  ABDOMEN: Soft, non-tender, non-distended MUSCULOSKELETAL:  No edema; No deformity  SKIN: Warm and dry NEUROLOGIC:  Alert and oriented x 3 PSYCHIATRIC:  Normal affect   ASSESSMENT:    1. Chronic combined systolic and diastolic heart failure (HCC)   2. NSVT (nonsustained ventricular tachycardia) (HCC)   3. Essential hypertension   4. Left bundle branch block    PLAN:    In order of problems listed above:  1. Chronic combined systolic and diastolic heart failure NYHA class III symptoms.  Warm and nearly euvolemic on my exam today.  Plan to proceed with CRT-D implant.  I discussed the procedure in detail with the patient including the risks, expected recovery time and he wishes to proceed with scheduling.  I have seen BRAELEN Carpenter is a 55 y.o. malepre-procedural and has been referred by Dr 40 for consideration of ICD implant for primary prevention of sudden death.  [name] has been reviewed and they meet criteria for ICD implant.  I have had a thorough discussion with the patient reviewing options.  The patient and their family (if available) have had opportunities to ask questions and have them answered. The patient and I have decided together through the [facility] Share Decision Support Tool to proceed with BiV ICD at this time.   Risks, benefits, alternatives to ICD implantation were discussed in detail with the patient today. The patient  understands that the risks include but are not limited to bleeding, infection, pneumothorax, perforation, tamponade, vascular damage, renal failure, MI, stroke, death, inappropriate shocks, and lead dislodgement and wishes to proceed.    Patient has concerns about receiving sedation given his severe sleep apnea.  We will plan on performing the procedure with local anesthesia only.   2. NSVT  History of ectopy on Holter monitor from 2021    3.  Hypertension Controlled on current regimen.   Total time spent with patient today 32 minutes. This includes reviewing records, evaluating the patient and coordinating care.            Medication Adjustments/Labs and Tests Ordered: Current medicines are reviewed at length with the patient today.  Concerns regarding medicines are outlined above.  No orders of the defined types were placed in this encounter.  No orders of the defined types were placed in this encounter.    Signed, Steffanie Dunn, MD, [facility]  07/22/2020 11:17 AM    Electrophysiology [facility]      ICD Criteria  Current LVEF:33%. Within 12 months prior to implant: Yes   Heart failure history: Yes, Class III  Cardiomyopathy history: Yes, Non-Ischemic Cardiomyopathy.  Atrial Fibrillation/Atrial Flutter: No.  Ventricular tachycardia history: No.  Cardiac arrest history: No.  History of syndromes with risk of sudden death: No.  Previous ICD: No.  Current ICD indication: Primary  PPM indication: Yes. Pacing type: Both. Greater than 40% RV pacing requirement anticipated. Indication: Atrial lead imiplant for SVT discrimination  Class I or II Bradycardia indication present: No  Beta Blocker therapy for 3 or more months: Yes, prescribed.   Ace Inhibitor/ARB therapy for 3 or more months: Yes, prescribed.

## 2020-07-22 NOTE — Progress Notes (Signed)
Electrophysiology Office Follow up Visit Note:    Date:  07/22/2020   ID:  Cody Carpenter, DOB 1965/11/03, MRN [id number]  PCP:  Patient, No Pcp Per  [facility] Cardiologist:  Rollene Rotunda, MD  [facility] Electrophysiologist:  Lanier Prude, MD    Interval History:    Cody Carpenter is a 55 y.o. male who presents for a follow up visit.  I last saw the patient April 23, 2020 for chronic combined systolic and diastolic heart failure.  Since I last saw him he has been able to have his cardiac MRI.  Since I last saw him he tells me that he continues to struggle with fatigue, exercise intolerance.    Past Medical History:  Diagnosis Date  . CHF (congestive heart failure) (HCC)   . Hypertension   . Sleep apnea    CPAP    Past Surgical History:  Procedure Laterality Date  . NO PAST SURGERIES      Current Medications: Current Meds  Medication Sig  . carvedilol (COREG) 25 MG tablet Take 1 tablet (25 mg total) by mouth 2 (two) times daily with a meal.  . sacubitril-valsartan (ENTRESTO) 49-51 MG Take 1 tablet by mouth 2 (two) times daily. Please make annual appt in April for refills. Thank you  . spironolactone (ALDACTONE) 25 MG tablet TAKE 1 TABLET BY MOUTH AT BEDTIME.     Allergies:   Patient has no known allergies.   Social History   Socioeconomic History  . Marital status: Single    Spouse name: Not on file  . Number of children: 3  . Years of education: Not on file  . Highest education level: Not on file  Occupational History  . Occupation: roofer  Tobacco Use  . Smoking status: Current Every Day Smoker    Packs/day: 1.00    Years: 25.00    Pack years: 25.00    Types: Cigarettes  . Smokeless tobacco: Never Used  Substance and Sexual Activity  . Alcohol use: Yes  . Drug use: No  . Sexual activity: Not on file  Other Topics Concern  . Not on file  Social History Narrative  . Not on file   Social Determinants of Health   Financial Resource  Strain: High Risk  . Difficulty of Paying Living Expenses: Very hard  Food Insecurity: Food Insecurity Present  . Worried About Programme researcher, broadcasting/film/video in the Last Year: Sometimes true  . Ran Out of Food in the Last Year: Sometimes true  Transportation Needs: No Transportation Needs  . Lack of Transportation (Medical): No  . Lack of Transportation (Non-Medical): No  Physical Activity: Not on file  Stress: Not on file  Social Connections: Not on file     Family History: The patient's family history includes Heart disease in his maternal grandfather; Lung cancer in his maternal grandmother.  ROS:   Please see the history of present illness.    All other systems reviewed and are negative.  EKGs/Labs/Other Studies Reviewed:    The following studies were reviewed today:  06/26/2020 MRI Cardiac IMPRESSION: 1. Moderately dilated LV with normal wall thickness. EF 26% with global hypokinesis, septal-lateral dyssynchrony consistent with LBBB. 2. Normal RV size with moderately decreased systolic function, EF 32%. 3. LGE pattern is not suggestive of coronary disease. Most consistent with prior myocarditis.  On delayed enhancement imaging, there is basal to mid inferoseptal mid-wall late gadolinium enhancement (LGE). There is basal anteroseptal mid-wall LGE.  EKG:  The ekg ordered today demonstrates sinus rhythm, left bundle branch block with a QRS duration 156 ms  Recent Labs: 10/08/2019: Hemoglobin 13.8; Platelets 167; TSH 1.610 12/17/2019: BUN 16; Creatinine, Ser 0.99; Magnesium 2.0; Potassium 4.6; Sodium 140  Recent Lipid Panel    Component Value Date/Time   CHOL  03/02/2007 0838    170        ATP III CLASSIFICATION:  <200     mg/dL   Desirable  200-239  mg/dL   Borderline High  >=240    mg/dL   High   TRIG 290 (H) 03/02/2007 0838   HDL 26 (L) 03/02/2007 0838   CHOLHDL 6.5 03/02/2007 0838   VLDL 58 (H) 03/02/2007 0838   LDLCALC  03/02/2007 0838    86        Total  Cholesterol/HDL:CHD Risk Coronary Heart Disease Risk Table                     Men   Women  1/2 Average Risk   3.4   3.3    Physical Exam:    VS:  BP 110/70   Pulse 62   Ht 5' 7" (1.702 m)   Wt 229 lb (103.9 kg)   SpO2 97%   BMI 35.87 kg/m     Wt Readings from Last 3 Encounters:  07/22/20 229 lb (103.9 kg)  07/14/20 228 lb 6.4 oz (103.6 kg)  05/06/20 228 lb (103.4 kg)     GEN:  Well nourished, well developed in no acute distress HEENT: Normal NECK: No JVD; No carotid bruits LYMPHATICS: No lymphadenopathy CARDIAC: RRR, no murmurs, rubs, gallops RESPIRATORY:  Clear to auscultation without rales, wheezing or rhonchi  ABDOMEN: Soft, non-tender, non-distended MUSCULOSKELETAL:  No edema; No deformity  SKIN: Warm and dry NEUROLOGIC:  Alert and oriented x 3 PSYCHIATRIC:  Normal affect   ASSESSMENT:    1. Chronic combined systolic and diastolic heart failure (HCC)   2. NSVT (nonsustained ventricular tachycardia) (HCC)   3. Essential hypertension   4. Left bundle branch block    PLAN:    In order of problems listed above:  1. Chronic combined systolic and diastolic heart failure NYHA class III symptoms.  Warm and nearly euvolemic on my exam today.  Plan to proceed with CRT-D implant.  I discussed the procedure in detail with the patient including the risks, expected recovery time and he wishes to proceed with scheduling.  I have seen Cody Carpenter is a 54 y.o. malepre-procedural and has been referred by Dr Hochrein for consideration of ICD implant for primary prevention of sudden death.  [name] has been reviewed and they meet criteria for ICD implant.  I have had a thorough discussion with the patient reviewing options.  The patient and their family (if available) have had opportunities to ask questions and have them answered. The patient and I have decided together through the [facility] Share Decision Support Tool to proceed with BiV ICD at this time.   Risks, benefits, alternatives to ICD implantation were discussed in detail with the patient today. The patient  understands that the risks include but are not limited to bleeding, infection, pneumothorax, perforation, tamponade, vascular damage, renal failure, MI, stroke, death, inappropriate shocks, and lead dislodgement and wishes to proceed.    Patient has concerns about receiving sedation given his severe sleep apnea.  We will plan on performing the procedure with local anesthesia only.   2. NSVT   History of ectopy on Holter monitor from 2021    3.  Hypertension Controlled on current regimen.   Total time spent with patient today 32 minutes. This includes reviewing records, evaluating the patient and coordinating care.            Medication Adjustments/Labs and Tests Ordered: Current medicines are reviewed at length with the patient today.  Concerns regarding medicines are outlined above.  No orders of the defined types were placed in this encounter.  No orders of the defined types were placed in this encounter.    Signed, Steffanie Dunn, MD, [facility]  07/22/2020 11:17 AM    Electrophysiology [facility]      ICD Criteria  Current LVEF:33%. Within 12 months prior to implant: Yes   Heart failure history: Yes, Class III  Cardiomyopathy history: Yes, Non-Ischemic Cardiomyopathy.  Atrial Fibrillation/Atrial Flutter: No.  Ventricular tachycardia history: No.  Cardiac arrest history: No.  History of syndromes with risk of sudden death: No.  Previous ICD: No.  Current ICD indication: Primary  PPM indication: Yes. Pacing type: Both. Greater than 40% RV pacing requirement anticipated. Indication: Atrial lead imiplant for SVT discrimination  Class I or II Bradycardia indication present: No  Beta Blocker therapy for 3 or more months: Yes, prescribed.   Ace Inhibitor/ARB therapy for 3 or more months: Yes, prescribed.

## 2020-07-22 NOTE — Patient Instructions (Addendum)
Medication Instructions:  Your physician recommends that you continue on your current medications as directed. Please refer to the Current Medication list given to you today.  Labwork: CBC, BMP  Testing/Procedures: Your physician has recommended that you have a defibrillator inserted. An implantable cardioverter defibrillator (ICD) is a small device that is placed in your chest or, in rare cases, your abdomen. This device uses electrical pulses or shocks to help control life-threatening, irregular heartbeats that could lead the heart to suddenly stop beating (sudden cardiac arrest). Leads are attached to the ICD that goes into your heart. This is done in the hospital and usually requires an overnight stay. Please see the instruction sheet given to you today for more information.   Any Other Special Instructions Will Be Listed Below (If Applicable).  If you need a refill on your cardiac medications before your next appointment, please call your pharmacy.    Cardioverter Defibrillator Implantation An implantable cardioverter defibrillator (ICD) is a device that identifies and corrects abnormal heart rhythms. Cardioverter defibrillator implantation is a surgery to place an ICD under the skin in the chest or abdomen. An ICD has a battery, a small computer (pulse generator), and wires (leads) that go into the heart. The ICD detects and corrects two types of dangerous irregular heart rhythms (arrhythmias):  A rapid heart rhythm in the lower chambers of the heart (ventricles). This is called ventricular tachycardia.  The ventricles contracting in an uncoordinated way. This is called ventricular fibrillation. There are different types of ICDs, and the electrical signals from the ICD can be programmed differently based on the condition being treated. The electrical signals from the ICD can be low-energy pulses, high-energy shocks, or a combination of the two. The low-energy pulses are generally used to  restore the heartbeat to normal when it is either too slow (bradycardia) or too fast. These pulses are painless. The high-energy shocks are used to treat abnormal rhythms such as ventricular tachycardia or ventricular fibrillation. This shock may feel like a strong jolt in the chest. Your health care provider may recommend an ICD if you have:  Had a ventricular arrhythmia in the past.  A damaged heart because of a disease or heart condition.  A weakened heart muscle from a heart attack or cardiac arrest.  A congenital heart defect.  Long QT syndrome, which is a disorder of the heart's electrical system.  Brugada syndrome, which is a condition that causes a disruption of the heart's normal rhythm. Tell a health care provider about:  Any allergies you have.  All medicines you are taking, including vitamins, herbs, eye drops, creams, and over-the-counter medicines.  Any problems you or family members have had with anesthetic medicines.  Any blood disorders you have.  Any surgeries you have had.  Any medical conditions you have.  Whether you are pregnant or may be pregnant. What are the risks? Generally, this is a safe procedure. However, problems may occur, including:  Infection.  Bleeding.  Allergic reactions to medicines used during the procedure.  Blood clots.  Swelling or bruising.  Damage to nearby structures or organs, such as nerves, lungs, blood vessels, or the heart where the ICD leads or pulse generator is implanted. What happens before the procedure? Staying hydrated Follow instructions from your health care provider about hydration, which may include:  Up to 2 hours before the procedure - you may continue to drink clear liquids, such as water, clear fruit juice, black coffee, and plain tea.   Eating and   drinking restrictions Follow instructions from your health care provider about eating and drinking, which may include:  8 hours before the procedure - stop  eating heavy meals or foods, such as meat, fried foods, or fatty foods.  6 hours before the procedure - stop eating light meals or foods, such as toast or cereal.  6 hours before the procedure - stop drinking milk or drinks that contain milk.  2 hours before the procedure - stop drinking clear liquids. Medicines Ask your health care provider about:  Changing or stopping your regular medicines. This is especially important if you are taking diabetes medicines or blood thinners.  Taking medicines such as aspirin and ibuprofen. These medicines can thin your blood. Do not take these medicines unless your health care provider tells you to take them.  Taking over-the-counter medicines, vitamins, herbs, and supplements. Tests You may have an exam or testing. These may include:  Blood tests.  A test to check the electrical signals in your heart (electrocardiogram, ECG).  Imaging tests, such as a chest X-ray.  Echocardiogram. This is an ultrasound of your heart to evaluate your heart structures and function.  An event monitor or Holter monitor to wear at home. General instructions  Do not use any products that contain nicotine or tobacco for at least 4 weeks before the procedure. These products include cigarettes, chewing tobacco, and vaping devices, such as e-cigarettes. If you need help quitting, ask your health care provider.  Ask your health care provider: ? How your procedure site will be marked. ? What steps will be taken to help prevent infection. These may include:  Removing hair at the surgery site.  Washing skin with a germ-killing soap.  Taking antibiotic medicine.  You may be asked to shower with a germ-killing soap.  Plan to have a responsible adult take you home from the hospital or clinic. What happens during the procedure?  Small monitors will be put on your body. They will be used to check your heart rate, blood pressure, and oxygen level.  A pair of sticky  pads (defibrillator pads) may be placed on your back and chest. These pads are able to pace your heart as needed during the procedure.  An IV will be inserted into one of your veins.  You will be given one or more of the following: ? A medicine to help you relax (sedative). ? A medicine to numb the area (local anesthetic). ? A medicine to make you fall asleep(general anesthetic).  A small incision will be made to create a deep pocket under the skin of your chest or abdomen.  Leads will be guided through a blood vessel into your heart and attached to your heart muscles. Depending on the ICD, the leads may go into one ventricle, or they may go into both ventricles and into an upper chamber of the heart. An X-ray machine (fluoroscope) will be used to help guide the leads. The other end of the leads will be attached to the pulse generator.  The pulse generator will be placed into the pocket under the skin.  The ICD will be tested, and your health care provider will program the ICD for the condition being treated.  The incision will be closed with stitches (sutures), skin glue, adhesive strips, or staples.  A bandage (dressing) will be placed over the incision. The procedure may vary among health care providers and hospitals.   What happens after the procedure?  Your blood pressure, heart rate, breathing rate,   and blood oxygen level will be monitored until you leave the hospital or clinic. Your health care provider will also monitor your ICD to make sure it is working properly.  A chest X-ray will be taken to check that the ICD is in the right place.  Do not raise the arm on the side of your procedure higher than your shoulder for as long as told by your health care provider. This is usually at least 6 weeks.  You may be given an identification card explaining that you have an ICD.  You will be given a remote home monitoring device to use with your ICD to allow your device to communicate  with your clinic. Summary  An implantable cardioverter defibrillator (ICD) is a device that identifies and corrects abnormal heart rhythms. Cardioverter defibrillator implantation is a surgery to place an ICD under the skin in the chest or abdomen.  An ICD consists of a battery, a small computer (pulse generator), and wires (leads) that go into the heart.  During the procedure, the ICD will be tested, and your health care provider will program the ICD for the condition being treated.  After the procedure, a chest X-ray will be taken to check that the ICD is in the right place. This information is not intended to replace advice given to you by your health care provider. Make sure you discuss any questions you have with your health care provider. Document Revised: 10/17/2019 Document Reviewed: 10/17/2019 Elsevier Patient Education  2021 [employer]        

## 2020-07-23 MED FILL — spIRONOLACTONE 25 MG TABS: 25 | 90 days supply | Qty: 90 | Fill #3

## 2020-07-24 ENCOUNTER — Telehealth: Payer: Self-pay | Admitting: Licensed Clinical Social Worker

## 2020-07-24 NOTE — Progress Notes (Signed)
Heart and Vascular Care Navigation  07/24/2020  Cody Carpenter [birth year] [id number]  Reason for Referral:  Engaged with patient by telephone for follow up visit for Heart and Vascular Care Coordination. F/u on disability and upcoming procedure.                                                                                                   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 I shared that there is not anything that I can do personally to speed up the process but that I can reach out to [facility] who assisted pt with filing the paperwork for disability initially. Pt gives me permission to call and inquire about this.   I called [facility] and spoke with [facility] there. She was able to look into the file and found that the last update in February is that it is still pending w/ no additional medical information missing. LCSW shared pt concerns and that pt has upcoming surgery. Cody Carpenter will add those notes to the file and request needed information from the hospital post surgery and send into [employer].   LCSW called back and shared the above information w/ pt. Pt shares that he is interested in getting their number for any additional check ins moving forward. He is still receiving SNAP benefits and has been pleasantly surprised at the cost of the medications that are not being mailed to him from [facility], he is able to afford the $3 copay. He shares that the only worry he has currently is the cost of the light bill since he has been helping how he can for his friend to cover that cost. LCSW shared that I could refer pt/pt friend for utility application assistance through [facility] for [facility] and [employer]. Pt shared some hesitancy and wants to  ask his friend before he commits to assistance. If pt does not want to go this route I can potentially ask Patient Care Fund for assistance.   HRT/VAS Care Coordination    Patients Home Cardiology Office [facility]   Outpatient Care Team Social Worker   Social Worker Name: Octavio Graves, LCSW, [facility]   Living arrangements for the past 2 months Single Family Home   Lives with: Friends   Patient Current Insurance Coverage Medicaid   Patient Has Concern With Paying Medical Bills No   Patient Concerns With Medical Bills past medical bills   Medical Bill Referrals: Medicaid Pending   Does Patient Have Prescription Coverage? Yes   Patient Prescription Assistance Programs Patient Assistance Programs; [place] [employer]   [place] [employer] Medications Carvedilol   Patient Assistance Programs Medications Sherryll Burger  Other Assistance Programs Medications GoodRx/[facility]   Home Assistive Devices/Equipment None      Social History:                                                                             SDOH Screenings   Alcohol Screen: Not on file  Depression (TEL0-7): Not on file  Financial Resource Strain: High Risk  . Difficulty of Paying Living Expenses: Very hard  Food Insecurity: No Food Insecurity  . Worried About Programme researcher, broadcasting/film/video in the Last Year: Never true  . Ran Out of Food in the Last Year: Never true  Housing: High Risk  . Last Housing Risk Score: 2  Physical Activity: Not on file  Social Connections: Not on file  Stress: Not on file  Tobacco Use: High Risk  . Smoking Tobacco Use: Current Every Day Smoker  . Smokeless Tobacco Use: Never Used  Transportation Needs: No Transportation Needs  . Lack of Transportation (Medical): No  . Lack of Transportation (Non-Medical): No    SDOH Interventions: Financial Resources:  Financial Strain Interventions: Other (Comment) (f/u with [facility]; offered referral to [facility] utility application assistance  program, offered referral to Patient Care Fund)   Food Insecurity:  Food Insecurity Interventions: Other (Comment) (pt getting assistance from [facility] at this time)   Follow-up plan:   Pt has my number for any additional questions and I will f/u with Dr. Geannie Risen nurse and I will f/u with pt post procedure for any additional needs that may arise.

## 2020-07-25 ENCOUNTER — Other Ambulatory Visit (HOSPITAL_COMMUNITY)
Admission: RE | Admit: 2020-07-25 | Discharge: 2020-07-25 | Disposition: A | Discharge status: Home / Self Care | Payer: Medicaid Other | Source: Ambulatory Visit | Attending: Cardiology | Admitting: Cardiology

## 2020-07-25 DIAGNOSIS — Z01812 Encounter for preprocedural laboratory examination: Secondary | ICD-10-CM | POA: Diagnosis present

## 2020-07-25 DIAGNOSIS — Z20822 Contact with and (suspected) exposure to covid-19: Secondary | ICD-10-CM | POA: Diagnosis not present

## 2020-07-25 LAB — SARS CORONAVIRUS 2 (TAT 6-24 HRS): SARS Coronavirus 2: NEGATIVE

## 2020-07-25 NOTE — Pre-Procedure Instructions (Signed)
Instructed patient on the following items: Arrival time 1130 Nothing to eat or drink after midnight No meds AM of procedure Responsible person to drive you home and stay with you for 24 hrs Wash with special soap night before and morning of procedure  

## 2020-07-28 ENCOUNTER — Ambulatory Visit (HOSPITAL_COMMUNITY)
Admission: RE | Admit: 2020-07-28 | Discharge: 2020-07-28 | Disposition: A | Discharge status: Home / Self Care | Payer: Medicaid Other | Attending: Cardiology | Admitting: Cardiology

## 2020-07-28 ENCOUNTER — Ambulatory Visit (HOSPITAL_COMMUNITY)
Admission: RE | Disposition: A | Discharge status: Home / Self Care | Payer: Self-pay | Source: Home / Self Care | Attending: Cardiology

## 2020-07-28 ENCOUNTER — Encounter (HOSPITAL_COMMUNITY): Payer: Self-pay | Admitting: Cardiology

## 2020-07-28 ENCOUNTER — Ambulatory Visit (HOSPITAL_COMMUNITY): Payer: Medicaid Other

## 2020-07-28 ENCOUNTER — Other Ambulatory Visit: Payer: Self-pay

## 2020-07-28 DIAGNOSIS — I472 Ventricular tachycardia: Secondary | ICD-10-CM | POA: Diagnosis not present

## 2020-07-28 DIAGNOSIS — I5042 Chronic combined systolic (congestive) and diastolic (congestive) heart failure: Secondary | ICD-10-CM | POA: Diagnosis not present

## 2020-07-28 DIAGNOSIS — Z79899 Other long term (current) drug therapy: Secondary | ICD-10-CM | POA: Insufficient documentation

## 2020-07-28 DIAGNOSIS — F17210 Nicotine dependence, cigarettes, uncomplicated: Secondary | ICD-10-CM | POA: Diagnosis not present

## 2020-07-28 DIAGNOSIS — Z5941 Food insecurity: Secondary | ICD-10-CM | POA: Diagnosis not present

## 2020-07-28 DIAGNOSIS — Z596 Low income: Secondary | ICD-10-CM | POA: Diagnosis not present

## 2020-07-28 DIAGNOSIS — Z95810 Presence of automatic (implantable) cardiac defibrillator: Secondary | ICD-10-CM

## 2020-07-28 DIAGNOSIS — G4730 Sleep apnea, unspecified: Secondary | ICD-10-CM | POA: Diagnosis not present

## 2020-07-28 DIAGNOSIS — I110 Hypertensive heart disease with heart failure: Secondary | ICD-10-CM | POA: Diagnosis not present

## 2020-07-28 DIAGNOSIS — Z8249 Family history of ischemic heart disease and other diseases of the circulatory system: Secondary | ICD-10-CM | POA: Diagnosis not present

## 2020-07-28 DIAGNOSIS — I428 Other cardiomyopathies: Secondary | ICD-10-CM | POA: Insufficient documentation

## 2020-07-28 HISTORY — PX: BIV ICD INSERTION CRT-D: EP1195

## 2020-07-28 IMAGING — CR DG CHEST 2V
2 series · 2 of 2 positions shown · non-contrast
Comparison: [DATE]

CLINICAL DATA: ICD placed.

EXAM:
CHEST - 2 VIEW

[w chest pa]
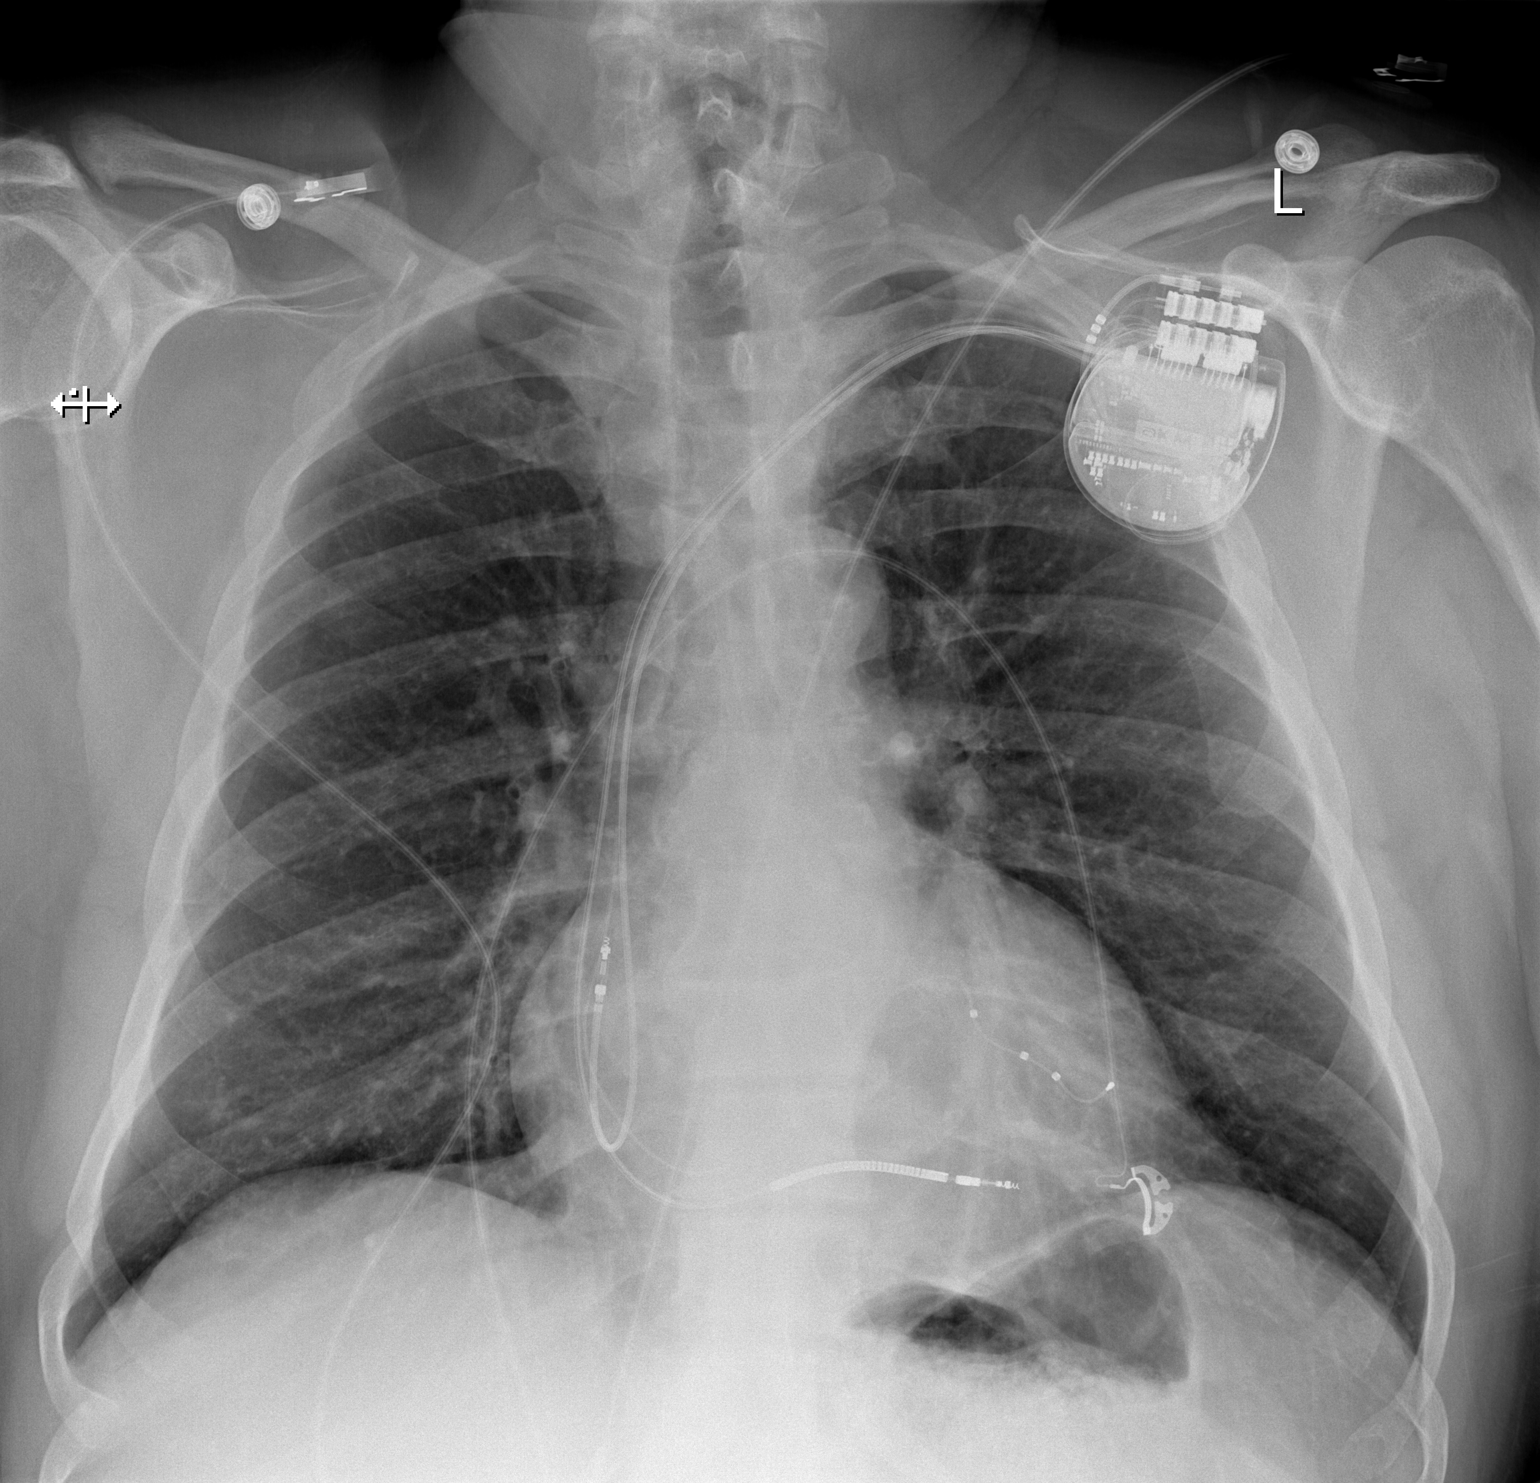

[w chest lat]
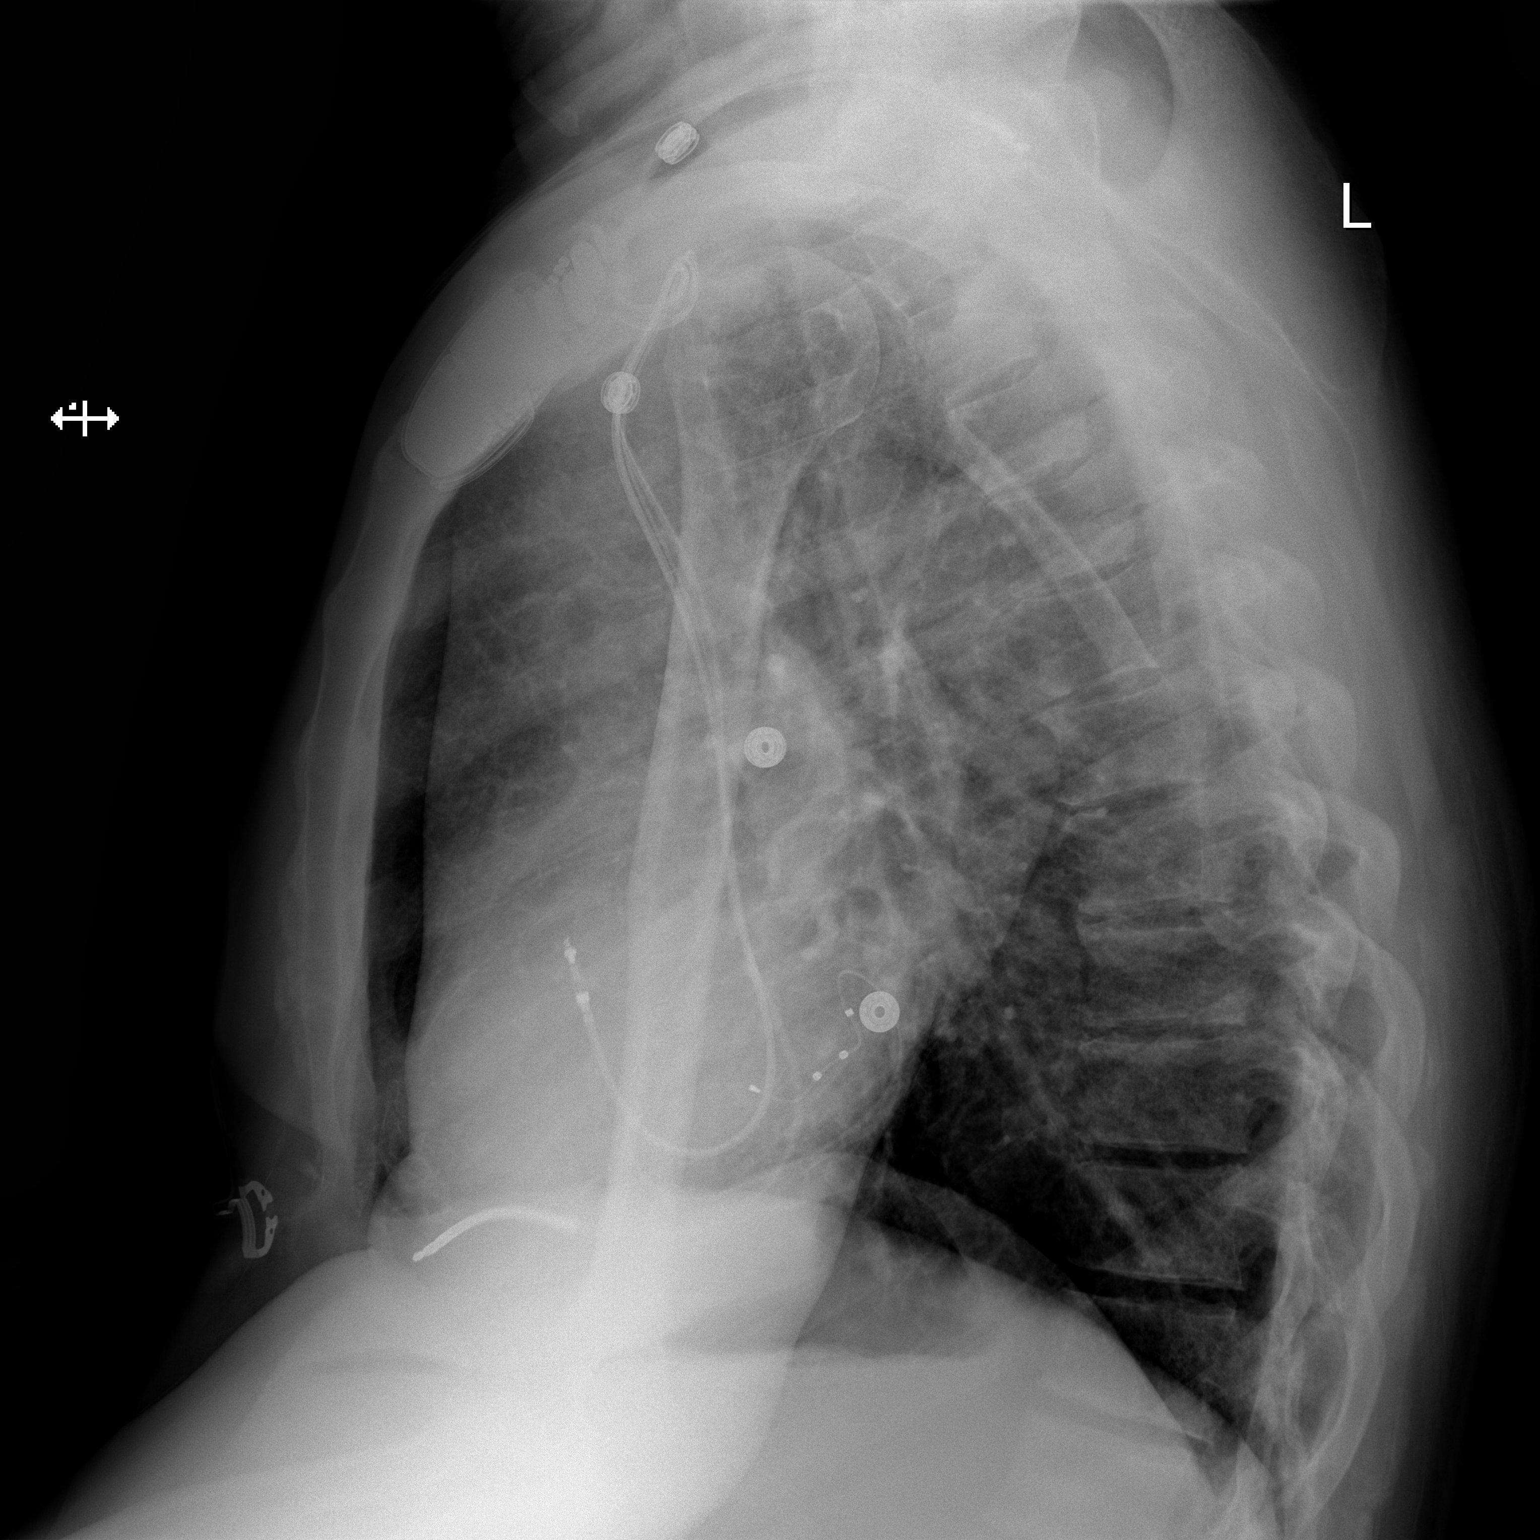

[2 of 2 positions shown; findings below may reference images not displayed]

FINDINGS: Interval left subclavian pacer and AICD with satisfactory positions
of the leads. Stable mildly enlarged cardiac silhouette and tortuous
aorta. Mild diffuse peribronchial thickening. Mildly prominent
pulmonary vasculature. Mild thoracic spine degenerative changes. No
pneumothorax seen.
IMPRESSION: 1. Satisfactory position of the left subclavian pacer and AICD.
2. Mild cardiomegaly, mild pulmonary vascular congestion and mild
bronchitic changes.

## 2020-07-28 SURGERY — BIV ICD INSERTION CRT-D

## 2020-07-28 MED ORDER — SODIUM CHLORIDE 0.9 % IV SOLN: INTRAVENOUS | Status: DC

## 2020-07-28 MED ORDER — POVIDONE-IODINE 10 % EX SWAB: 2.0000 "application " | Freq: Once | CUTANEOUS | Status: DC

## 2020-07-28 MED ORDER — ACETAMINOPHEN 325 MG PO TABS: 325.0000 mg | ORAL_TABLET | ORAL | Status: DC | PRN

## 2020-07-28 MED ORDER — CEFAZOLIN SODIUM-DEXTROSE 2-4 GM/100ML-% IV SOLN
2.0000 g | INTRAVENOUS | Status: AC
  Administered 2020-07-28: 2 g via INTRAVENOUS

## 2020-07-28 MED ORDER — SODIUM CHLORIDE 0.9 % IV SOLN
INTRAVENOUS | Status: AC
  Filled 2020-07-28: qty 2

## 2020-07-28 MED ORDER — CEFAZOLIN SODIUM-DEXTROSE 2-4 GM/100ML-% IV SOLN
INTRAVENOUS | Status: AC
  Filled 2020-07-28: qty 100

## 2020-07-28 MED ORDER — IOHEXOL 350 MG/ML SOLN
INTRAVENOUS | Status: DC | PRN
  Administered 2020-07-28: 10 mL

## 2020-07-28 MED ORDER — ACETAMINOPHEN 325 MG PO TABS
ORAL_TABLET | ORAL | Status: AC
  Administered 2020-07-28: 650 mg via ORAL
  Filled 2020-07-28: qty 2

## 2020-07-28 MED ORDER — KETOROLAC TROMETHAMINE 30 MG/ML IJ SOLN
30.0000 mg | INTRAMUSCULAR | Status: AC
  Administered 2020-07-28: 30 mg via INTRAVENOUS
  Filled 2020-07-28: qty 1

## 2020-07-28 MED ORDER — CHLORHEXIDINE GLUCONATE 4 % EX LIQD: 4.0000 "application " | Freq: Once | CUTANEOUS | Status: DC

## 2020-07-28 MED ORDER — LIDOCAINE HCL 1 % IJ SOLN
INTRAMUSCULAR | Status: AC
  Filled 2020-07-28: qty 60

## 2020-07-28 MED ORDER — LIDOCAINE HCL (PF) 1 % IJ SOLN
INTRAMUSCULAR | Status: DC | PRN
  Administered 2020-07-28: 70 mL

## 2020-07-28 MED ORDER — SODIUM CHLORIDE 0.9 % IV SOLN
80.0000 mg | INTRAVENOUS | Status: AC
  Administered 2020-07-28: 80 mg

## 2020-07-28 MED ORDER — ONDANSETRON HCL 4 MG/2ML IJ SOLN: 4.0000 mg | Freq: Four times a day (QID) | INTRAMUSCULAR | Status: DC | PRN

## 2020-07-28 MED ORDER — LIDOCAINE HCL 1 % IJ SOLN
INTRAMUSCULAR | Status: AC
  Filled 2020-07-28: qty 20

## 2020-07-28 MED ORDER — HEPARIN (PORCINE) IN NACL 1000-0.9 UT/500ML-% IV SOLN
INTRAVENOUS | Status: AC
  Filled 2020-07-28: qty 500

## 2020-07-28 MED ORDER — HEPARIN (PORCINE) IN NACL 1000-0.9 UT/500ML-% IV SOLN
INTRAVENOUS | Status: DC | PRN
  Administered 2020-07-28: 500 mL

## 2020-07-28 SURGICAL SUPPLY — 18 items
BALLN COR SINUS VENO 6FR 80 (BALLOONS) ×2
BALLOON COR SINUS VENO 6FR 80 (BALLOONS) ×1 IMPLANT
CABLE SURGICAL S-101-97-12 (CABLE) ×2 IMPLANT
CATH ATTAIN COM SURV 6250V-EH (CATHETERS) ×2 IMPLANT
ICD GALLANT HFCRTD CDHFA500Q (ICD Generator) ×2 IMPLANT
KIT ESSENTIALS PG (KITS) ×2 IMPLANT
LEAD DURATA 7122Q-65CM (Lead) ×2 IMPLANT
LEAD QUARTET 1458Q-86CM (Lead) ×2 IMPLANT
LEAD TENDRIL MRI 52CM LPA1200M (Lead) ×2 IMPLANT
PAD PRO RADIOLUCENT 2001M-C (PAD) ×2 IMPLANT
SHEATH 7FR PRELUDE SNAP 13 (SHEATH) ×2 IMPLANT
SHEATH 8FR PRELUDE SNAP 13 (SHEATH) ×2 IMPLANT
SHEATH 9.5FR PRELUDE SNAP 13 (SHEATH) ×2 IMPLANT
SHEATH PROBE COVER 6X72 (BAG) ×2 IMPLANT
SLITTER 6232ADJ (MISCELLANEOUS) ×2 IMPLANT
TRAY PACEMAKER INSERTION (PACKS) ×2 IMPLANT
WIRE ACUITY WHISPER EDS 4648 (WIRE) ×2 IMPLANT
WIRE HI TORQ VERSACORE-J 145CM (WIRE) ×2 IMPLANT

## 2020-07-28 NOTE — Progress Notes (Signed)
Pt rhythm is ventricular bigeminy, received report/ Ilene RN EP lab that Dr Lalla Brothers was aware and was the same during the insertion. No further orders at this time.

## 2020-07-28 NOTE — Progress Notes (Signed)
Pt ambulated without difficulty or bleeding.   Discharged home with girlfriend who will drive and stay with pt x 24 hrs. CXR has been done and evaluated by Dr Lalla Brothers.  Renee.PA has seen pt.  Discharge teaching completed.

## 2020-07-28 NOTE — Interval H&P Note (Signed)
History and Physical Interval Note:  07/28/2020 12:09 PM  Cody Carpenter  has presented today for surgery, with the diagnosis of Cardiomyopathy.  The various methods of treatment have been discussed with the patient and family. After consideration of risks, benefits and other options for treatment, the patient has consented to  Procedure(s): BIV ICD INSERTION CRT-D (N/A) as a surgical intervention.  The patient's history has been reviewed, patient examined, no change in status, stable for surgery.  I have reviewed the patient's chart and labs.  Questions were answered to the patient's satisfaction.     Deryl Giroux T Amedee Cerrone

## 2020-07-28 NOTE — Discharge Instructions (Signed)
    Supplemental Discharge Instructions for  Pacemaker/Defibrillator Patients  Tomorrow, 07/29/20, send in a device transmission  Activity No heavy lifting or vigorous activity with your left/right arm for 6 to 8 weeks.  Do not raise your left/right arm above your head for one week.  Gradually raise your affected arm as drawn below.             08/02/20                       08/03/20                      08/04/20                     08/05/20 __  NO DRIVING for  1 week   ; you may begin driving on   09/04/07  .  WOUND CARE - Keep the wound area clean and dry.  Do not get this area wet , no showers for one week; you may shower on  08/05/20   . - Tomorrow, 08/05/20, remove the arm sling - Tomorrow, 08/05/20 remove the outer plastic bandage.  Underneath the plastic bandage there are steri strips (paper tapes), DO NOT remove these.    The tape/steri-strips on your wound will fall off; do not pull them off.  No bandage is needed on the site.  DO  NOT apply any creams, oils, or ointments to the wound area. - If you notice any drainage or discharge from the wound, any swelling or bruising at the site, or you develop a fever > 101? F after you are discharged home, call the office at once.  Special Instructions - You are still able to use cellular telephones; use the ear opposite the side where you have your pacemaker/defibrillator.  Avoid carrying your cellular phone near your device. - When traveling through airports, show security personnel your identification card to avoid being screened in the metal detectors.  Ask the security personnel to use the hand wand. - Avoid arc welding equipment, MRI testing (magnetic resonance imaging), TENS units (transcutaneous nerve stimulators).  Call the office for questions about other devices. - Avoid electrical appliances that are in poor condition or are not properly grounded. - Microwave ovens are safe to be near or to operate.  Additional information for defibrillator  patients should your device go off: - If your device goes off ONCE and you feel fine afterward, notify the device clinic nurses. - If your device goes off ONCE and you do not feel well afterward, call 911. - If your device goes off TWICE, call 911. - If your device goes off THREE times in one day, call 911.  DO NOT DRIVE YOURSELF OR A FAMILY MEMBER WITH A DEFIBRILLATOR TO THE HOSPITAL--CALL 911.

## 2020-07-29 ENCOUNTER — Encounter (HOSPITAL_COMMUNITY): Payer: Self-pay | Admitting: Cardiology

## 2020-07-29 MED FILL — Lidocaine HCl Local Inj 1%: INTRAMUSCULAR | Qty: 70 | Status: AC

## 2020-07-30 ENCOUNTER — Telehealth: Payer: Self-pay

## 2020-07-30 NOTE — Telephone Encounter (Signed)
Follow-up after same day discharge: Implant date: 07/28/20 MD: Dr. Steffanie Dunn Device: Murriel Hopper Location: Left Chest   Wound check visit: 08/07/20 @ 8:40 90 day MD follow-up: 11/14/20 @ 2:15  Remote Transmission received:Yes  Dressing removed: Yes Sling removed: Yes

## 2020-07-30 NOTE — Telephone Encounter (Signed)
-----   Message from Sheilah Pigeon, [place] sent at 07/29/2020  7:06 PM EDT ----- Same day d/c monday  ? I cant remember the company CRT CL

## 2020-08-04 ENCOUNTER — Other Ambulatory Visit (HOSPITAL_COMMUNITY): Payer: Medicaid Other

## 2020-08-04 NOTE — Progress Notes (Addendum)
Cardiology Office Note   Date:  08/05/2020   ID:  AIDENN SKELLENGER, DOB 12-Dec-1965, MRN [id number]  PCP:  Patient, No Pcp Per (Inactive)  Cardiologist:   Rollene Rotunda, MD   Chief Complaint  Patient presents with  . Cardiomyopathy      History of Present Illness: Cody Carpenter is a 55 y.o. 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  His EF had been 20% in 2008 but improved with medical management. Unfortunately he eventually stopped his meds and his EF went from a high of 55% to 25% in 2015 and after med titration only went up to 30% in 2016.  However, in August his EF was 20 - 25%.   I have had a difficult time titrating his meds because of hypotension.  I did send him for a CPX recently.  He had a submax effort.  Peak VO2 was 17.5.    He was having palpitations at the last appt and I ordered a monitor which demonstrated frequent ventricular ectopy and NSVT.    He is now status post BiV ICD.   He feels better in that he is not having any palpitations.  He had been having almost presyncopal episodes and continued palpitations.  He is now gone away.  He is sore.  He is not describing however any fevers or chills.  He is not been able to do very much yet as he just had the procedure.  He is not having any new shortness of breath, PND or orthopnea.  He has had no weight gain or edema.  Past Medical History:  Diagnosis Date  . CHF (congestive heart failure) (HCC)   . Hypertension   . Sleep apnea    CPAP    Past Surgical History:  Procedure Laterality Date  . BIV ICD INSERTION CRT-D N/A 07/28/2020   Procedure: BIV ICD INSERTION CRT-D;  Surgeon: Lanier Prude, MD;  Location: [facility] INVASIVE CV LAB;  Service: Cardiovascular;  Laterality: N/A;  . NO PAST SURGERIES       Current Outpatient Medications  Medication Sig Dispense Refill  . carvedilol (COREG) 25 MG tablet Take 1 tablet (25 mg total) by mouth 2 (two) times daily with a meal. 180  tablet 3  . sacubitril-valsartan (ENTRESTO) 49-51 MG Take 1 tablet by mouth 2 (two) times daily. Please make annual appt in April for refills. Thank you 180 tablet 0  . spironolactone (ALDACTONE) 25 MG tablet TAKE 1 TABLET BY MOUTH AT BEDTIME. 90 tablet 3   No current facility-administered medications for this visit.    Allergies:   Patient has no known allergies.    ROS:  Please see the history of present illness.   Otherwise, review of systems are positive for fatigue.   All other systems are reviewed and negative.    PHYSICAL EXAM: VS:  BP 112/60 (BP Location: Right Arm, Patient Position: Sitting)   Pulse (!) 44   Ht 5\' 7"  (1.702 m)   Wt 230 lb (104.3 kg)   SpO2 96%   BMI 36.02 kg/m  , BMI Body mass index is 36.02 kg/m. GENERAL:  Well appearing NECK:  No jugular venous distention, waveform within normal limits, carotid upstroke brisk and symmetric, no bruits, no thyromegaly LUNGS:  Clear to auscultation bilaterally CHEST: ICD pocket Steri-Stripped without drainage or hematoma some slight ecchymosis HEART:  PMI not displaced or sustained,S1 and S2 within normal limits, no S3, no  S4, no clicks, no rubs, no murmurs ABD:  Flat, positive bowel sounds normal in frequency in pitch, no bruits, no rebound, no guarding, no midline pulsatile mass, no hepatomegaly, no splenomegaly EXT:  2 plus pulses throughout, no edema, no cyanosis no clubbing   EKG:  EKG is not ordered today.   Recent Labs: 10/08/2019: TSH 1.610 12/17/2019: Magnesium 2.0 07/22/2020: BUN 15; Creatinine, Ser 0.96; Hemoglobin 14.6; Platelets 192; Potassium 4.5; Sodium 138    Lipid Panel    Component Value Date/Time   CHOL  03/02/2007 0838    170        ATP III CLASSIFICATION:  <200     mg/dL   Desirable  308-657  mg/dL   Borderline High  >=846    mg/dL   High   TRIG 962 (H) 95/28/4132 0838   HDL 26 (L) 03/02/2007 0838   CHOLHDL 6.5 03/02/2007 0838   VLDL 58 (H) 03/02/2007 0838   LDLCALC  03/02/2007 0838    86         Total Cholesterol/HDL:CHD Risk Coronary Heart Disease Risk Table                     Men   Women  1/2 Average Risk   3.4   3.3      Wt Readings from Last 3 Encounters:  08/05/20 230 lb (104.3 kg)  07/28/20 225 lb (102.1 kg)  07/22/20 229 lb (103.9 kg)      Other studies Reviewed: Additional studies/ records that were reviewed today include: EP records. Review of the above records demonstrates: See elsewhere  ASSESSMENT AND PLAN:   CHRONIC SYSTOLIC HF:    He seems to be euvolemic.  He has got lower blood pressure so I will not try to titrate meds further.  PVCs:   He is now status post CRT D.     HTN: This is being managed in the context of treating his CHF  TOBACCO ABUSE: We talked many times about the need to stop smoking and we talked about it again today.  SLEEP APNEA:   A sleep study has been ordered and is scheduled   Current medicines are reviewed at length with the patient today.  The patient does not have concerns regarding medicines.  The following changes have been made:   None  Labs/ tests ordered today include:  None  No orders of the defined types were placed in this encounter.    Disposition:   FU with me or APP in 4 months.     Signed, Rollene Rotunda, MD  08/05/2020 1:52 PM    [facility]

## 2020-08-05 ENCOUNTER — Ambulatory Visit (INDEPENDENT_AMBULATORY_CARE_PROVIDER_SITE_OTHER): Payer: Medicaid Other | Admitting: Cardiology

## 2020-08-05 ENCOUNTER — Encounter: Payer: Self-pay | Admitting: Cardiology

## 2020-08-05 ENCOUNTER — Other Ambulatory Visit: Payer: Self-pay

## 2020-08-05 VITALS — BP 112/60 | HR 44 | Ht 67.0 in | Wt 230.0 lb

## 2020-08-05 DIAGNOSIS — I10 Essential (primary) hypertension: Secondary | ICD-10-CM | POA: Diagnosis not present

## 2020-08-05 DIAGNOSIS — Z95810 Presence of automatic (implantable) cardiac defibrillator: Secondary | ICD-10-CM

## 2020-08-05 DIAGNOSIS — I5022 Chronic systolic (congestive) heart failure: Secondary | ICD-10-CM

## 2020-08-05 DIAGNOSIS — I472 Ventricular tachycardia: Secondary | ICD-10-CM

## 2020-08-05 DIAGNOSIS — I4729 Other ventricular tachycardia: Secondary | ICD-10-CM

## 2020-08-05 NOTE — Patient Instructions (Signed)
Medication Instructions:  Continue current medications  *If you need a refill on your cardiac medications before your next appointment, please call your pharmacy*   Lab Work: None Ordered   Testing/Procedures: None Ordered   Follow-Up: At [facility], you and your health needs are our priority.  As part of our continuing mission to provide you with exceptional heart care, we have created designated Provider Care Teams.  These Care Teams include your primary Cardiologist (physician) and Advanced Practice Providers (APPs -  Physician Assistants and Nurse Practitioners) who all work together to provide you with the care you need, when you need it.  We recommend signing up for the patient portal called "MyChart".  Sign up information is provided on this After Visit Summary.  MyChart is used to connect with patients for Virtual Visits (Telemedicine).  Patients are able to view lab/test results, encounter notes, upcoming appointments, etc.  Non-urgent messages can be sent to your provider as well.   To learn more about what you can do with MyChart, go to [email or web address].    Your next appointment:   4 month(s)  The format for your next appointment:   In Person  Provider:   You may see James Hochrein, MD or one of the following Advanced Practice Providers on your designated Care Team:    Rhonda Barrett, PA-C  Kathryn Lawrence, DNP, ANP    

## 2020-08-07 ENCOUNTER — Other Ambulatory Visit (HOSPITAL_COMMUNITY): Payer: Self-pay

## 2020-08-07 ENCOUNTER — Ambulatory Visit (INDEPENDENT_AMBULATORY_CARE_PROVIDER_SITE_OTHER): Payer: Medicaid Other | Admitting: Emergency Medicine

## 2020-08-07 ENCOUNTER — Other Ambulatory Visit: Payer: Self-pay

## 2020-08-07 DIAGNOSIS — I472 Ventricular tachycardia: Secondary | ICD-10-CM | POA: Diagnosis not present

## 2020-08-07 DIAGNOSIS — I420 Dilated cardiomyopathy: Secondary | ICD-10-CM | POA: Diagnosis not present

## 2020-08-07 DIAGNOSIS — I4729 Other ventricular tachycardia: Secondary | ICD-10-CM

## 2020-08-07 LAB — CUP PACEART INCLINIC DEVICE CHECK
Battery Remaining Longevity: 64 mo
Brady Statistic RA Percent Paced: 22 %
Brady Statistic RV Percent Paced: 75 %
Date Time Interrogation Session: 20220407120328
HighPow Impedance: 64.125
Implantable Lead Implant Date: 20220328
Implantable Lead Implant Date: 20220328
Implantable Lead Implant Date: 20220328
Implantable Lead Location: 753858
Implantable Lead Location: 753859
Implantable Lead Location: 753860
Implantable Pulse Generator Implant Date: 20220328
Lead Channel Impedance Value: 412.5 Ohm
Lead Channel Impedance Value: 512.5 Ohm
Lead Channel Impedance Value: 937.5 Ohm
Lead Channel Pacing Threshold Amplitude: 0.5 V
Lead Channel Pacing Threshold Amplitude: 0.5 V
Lead Channel Pacing Threshold Amplitude: 0.75 V
Lead Channel Pacing Threshold Amplitude: 0.75 V
Lead Channel Pacing Threshold Amplitude: 0.75 V
Lead Channel Pacing Threshold Amplitude: 0.75 V
Lead Channel Pacing Threshold Pulse Width: 0.5 ms
Lead Channel Pacing Threshold Pulse Width: 0.5 ms
Lead Channel Pacing Threshold Pulse Width: 0.5 ms
Lead Channel Pacing Threshold Pulse Width: 0.5 ms
Lead Channel Pacing Threshold Pulse Width: 0.5 ms
Lead Channel Pacing Threshold Pulse Width: 0.5 ms
Lead Channel Sensing Intrinsic Amplitude: 1.7 mV
Lead Channel Sensing Intrinsic Amplitude: 11.9 mV
Lead Channel Setting Pacing Amplitude: 3.5 V
Lead Channel Setting Pacing Amplitude: 3.5 V
Lead Channel Setting Pacing Amplitude: 3.5 V
Lead Channel Setting Pacing Pulse Width: 0.5 ms
Lead Channel Setting Pacing Pulse Width: 0.5 ms
Lead Channel Setting Sensing Sensitivity: 0.5 mV
Pulse Gen Serial Number: 810019794

## 2020-08-07 MED ORDER — AMIODARONE HCL 200 MG PO TABS
ORAL_TABLET | ORAL | 3 refills | Status: DC
  Filled 2020-08-07: qty 111, 90d supply, fill #0

## 2020-08-07 NOTE — Progress Notes (Signed)
Wound check appointment. Steri-strips removed. Wound without redness or edema. Incision edges approximated, wound well healed. Device interrogated, pt in Warner rhythm.  Dr. Lalla Brothers notified, MD ordered start of Amoidarone due to increased PVC Burden-17%.  Thresholds, sensing, and impedances consistent with implant measurements. Device programmed at 3.5V for extra safety margin until 3 month visit. Histogram distribution appropriate for patient and level of activity. No mode switches noted. Patient educated about wound care, arm mobility, lifting restrictions, shock plan. ROV with Dr. Lalla Brothers 11/14/20.

## 2020-08-07 NOTE — Patient Instructions (Signed)
Medication Instructions:  Your physician has recommended you make the following change in your medication:  1.  START taking amiodarone 200 mg--  A.  Take 2 tablets (400 mg) by mouth TWICE a day for 7 days  B.  AFTER 7 days REDUCE your dose to 1 tablet by mouth once a day  *If you need a refill on your cardiac medications before your next appointment, please call your pharmacy*   Lab Work: NONE.  If you have labs (blood work) drawn today and your tests are completely normal, you will receive your results only by: Marland Kitchen MyChart Message (if you have MyChart) OR . A paper copy in the mail If you have any lab test that is abnormal or we need to change your treatment, we will call you to review the results.   Testing/Procedures: NONE.  Follow-Up: At [facility], you and your health needs are our priority.  As part of our continuing mission to provide you with exceptional heart care, we have created designated Provider Care Teams.  These Care Teams include your primary Cardiologist (physician) and Advanced Practice Providers (APPs -  Physician Assistants and Nurse Practitioners) who all work together to provide you with the care you need, when you need it.  As scheduled.   Amiodarone tablets What is this medicine? AMIODARONE (a MEE oh da rone) is an antiarrhythmic drug. It helps make your heart beat regularly. Because of the side effects caused by this medicine, it is only used when other medicines have not worked. It is usually used for heartbeat problems that may be life threatening. This medicine may be used for other purposes; ask your health care provider or pharmacist if you have questions. COMMON BRAND NAME(S): Cordarone, Pacerone What should I tell my health care provider before I take this medicine? They need to know if you have any of these conditions:  liver disease  lung disease  other heart problems  thyroid disease  an unusual or allergic reaction to amiodarone,  iodine, other medicines, foods, dyes, or preservatives  pregnant or trying to get pregnant  breast-feeding How should I use this medicine? Take this medicine by mouth with a glass of water. Follow the directions on the prescription label. You can take this medicine with or without food. However, you should always take it the same way each time. Take your doses at regular intervals. Do not take your medicine more often than directed. Do not stop taking except on the advice of your doctor or health care professional. A special MedGuide will be given to you by the pharmacist with each prescription and refill. Be sure to read this information carefully each time. Talk to your pediatrician regarding the use of this medicine in children. Special care may be needed. Overdosage: If you think you have taken too much of this medicine contact a poison control center or emergency room at once. NOTE: This medicine is only for you. Do not share this medicine with others. What if I miss a dose? If you miss a dose, take it as soon as you can. If it is almost time for your next dose, take only that dose. Do not take double or extra doses. What may interact with this medicine? Do not take this medicine with any of the following medications:  abarelix  apomorphine  arsenic trioxide  certain antibiotics like erythromycin, gemifloxacin, levofloxacin, pentamidine  certain medicines for depression like amoxapine, tricyclic antidepressants  certain medicines for fungal infections like fluconazole, itraconazole, ketoconazole,  posaconazole, voriconazole  certain medicines for irregular heart beat like disopyramide, dronedarone, ibutilide, propafenone, sotalol  certain medicines for malaria like chloroquine, halofantrine  cisapride  droperidol  haloperidol  hawthorn  maprotiline  methadone  phenothiazines like chlorpromazine, mesoridazine, thioridazine  pimozide  ranolazine  red yeast  rice  vardenafil This medicine may also interact with the following medications:  antiviral medicines for HIV or AIDS  certain medicines for blood pressure, heart disease, irregular heart beat  certain medicines for cholesterol like atorvastatin, cerivastatin, lovastatin, simvastatin  certain medicines for hepatitis C like sofosbuvir and ledipasvir; sofosbuvir  certain medicines for seizures like phenytoin  certain medicines for thyroid problems  certain medicines that treat or prevent blood clots like warfarin  cholestyramine  cimetidine  clopidogrel  cyclosporine  dextromethorphan  diuretics  dofetilide  fentanyl  general anesthetics  grapefruit juice  lidocaine  loratadine  methotrexate  other medicines that prolong the QT interval (cause an abnormal heart rhythm)  procainamide  quinidine  rifabutin, rifampin, or rifapentine  St. John's Wort  trazodone  ziprasidone This list may not describe all possible interactions. Give your health care provider a list of all the medicines, herbs, non-prescription drugs, or dietary supplements you use. Also tell them if you smoke, drink alcohol, or use illegal drugs. Some items may interact with your medicine. What should I watch for while using this medicine? Your condition will be monitored closely when you first begin therapy. Often, this drug is first started in a hospital or other monitored health care setting. Once you are on maintenance therapy, visit your doctor or health care professional for regular checks on your progress. Because your condition and use of this medicine carry some risk, it is a good idea to carry an identification card, necklace or bracelet with details of your condition, medications, and doctor or health care professional. Bonita Quin may get drowsy or dizzy. Do not drive, use machinery, or do anything that needs mental alertness until you know how this medicine affects you. Do not stand or sit  up quickly, especially if you are an older patient. This reduces the risk of dizzy or fainting spells. This medicine can make you more sensitive to the sun. Keep out of the sun. If you cannot avoid being in the sun, wear protective clothing and use sunscreen. Do not use sun lamps or tanning beds/booths. You should have regular eye exams before and during treatment. Call your doctor if you have blurred vision, see halos, or your eyes become sensitive to light. Your eyes may get dry. It may be helpful to use a lubricating eye solution or artificial tears solution. If you are going to have surgery or a procedure that requires contrast dyes, tell your doctor or health care professional that you are taking this medicine. What side effects may I notice from receiving this medicine? Side effects that you should report to your doctor or health care professional as soon as possible:  allergic reactions like skin rash, itching or hives, swelling of the face, lips, or tongue  blue-gray coloring of the skin  blurred vision, seeing blue green halos, increased sensitivity of the eyes to light  breathing problems  chest pain  dark urine  fast, irregular heartbeat  feeling faint or light-headed  intolerance to heat or cold  nausea or vomiting  pain and swelling of the scrotum  pain, tingling, numbness in feet, hands  redness, blistering, peeling or loosening of the skin, including inside the mouth  spitting up blood  stomach pain  sweating  unusual or uncontrolled movements of body  unusually weak or tired  weight gain or loss  yellowing of the eyes or skin Side effects that usually do not require medical attention (report to your doctor or health care professional if they continue or are bothersome):  change in sex drive or performance  constipation  dizziness  headache  loss of appetite  trouble sleeping This list may not describe all possible side effects. Call your  doctor for medical advice about side effects. You may report side effects to FDA at 1-800-FDA-1088. Where should I keep my medicine? Keep out of the reach of children. Store at room temperature between 20 and 25 degrees C (68 and 77 degrees F). Protect from light. Keep container tightly closed. Throw away any unused medicine after the expiration date. NOTE: This sheet is a summary. It may not cover all possible information. If you have questions about this medicine, talk to your doctor, pharmacist, or health care provider.  2021 Elsevier/Gold Standard (2018-03-22 13:44:04)

## 2020-08-12 ENCOUNTER — Telehealth: Payer: Self-pay | Admitting: Cardiology

## 2020-08-12 NOTE — Telephone Encounter (Signed)
Patient c/o Palpitations:  High priority if patient c/o lightheadedness, shortness of breath, or chest pain  1) How long have you had palpitations/irregular HR/ Afib? Are you having the symptoms now? Last night, no  2) Are you currently experiencing lightheadedness, SOB or CP? Dizziness this morning, weak, not having symptoms now  3) Do you have a history of afib (atrial fibrillation) or irregular heart rhythm? Yes, arhythmia   4) Have you checked your BP or HR? (document readings if available): 1:00 am last night 126/77 HR 70, 121/82 HR 70 now  5) Are you experiencing any other symptoms? no   Patient's friends states the patient would like a call back to him about his fluttering heart. She states he had an ICD put in recently and since the adjustment has been having the fluttering off and on. She states he had the fluttering start at around 1:00 am last night. She states he is not having any symptoms now.

## 2020-08-12 NOTE — Telephone Encounter (Signed)
Patient reports " fluttering " sensation in his chest around 1 am this morning. He reports no chest pressure, chest pain , SOB , but reports dizziness after fluttering sensation began. Sensation lasted a " few" minutes. Patient is asymptomatic at this time. Remote transmission sent and reviewed . Device function WNL , no episodes since last interrogation.  Patient reassured ICD functioning properly and that no events recorded by device during the episode. He reports being highly anxious when he became aware of the " fluttering" in his chest. Shock plan and ED precautions reviewed.

## 2020-08-25 ENCOUNTER — Other Ambulatory Visit (HOSPITAL_COMMUNITY): Payer: Self-pay

## 2020-08-25 MED FILL — Carvedilol Tab 25 MG: ORAL | 30 days supply | Qty: 60 | Fill #0 | Status: CN

## 2020-08-26 ENCOUNTER — Other Ambulatory Visit (HOSPITAL_COMMUNITY): Payer: Self-pay

## 2020-08-27 ENCOUNTER — Other Ambulatory Visit (HOSPITAL_COMMUNITY): Payer: Self-pay

## 2020-08-27 MED FILL — Carvedilol Tab 25 MG: ORAL | 30 days supply | Qty: 60 | Fill #0 | Status: AC

## 2020-09-02 ENCOUNTER — Encounter (HOSPITAL_BASED_OUTPATIENT_CLINIC_OR_DEPARTMENT_OTHER): Payer: Medicaid Other | Admitting: Cardiovascular Disease

## 2020-09-16 ENCOUNTER — Encounter: Payer: Medicaid Other | Admitting: Cardiology

## 2020-09-16 DIAGNOSIS — I10 Essential (primary) hypertension: Secondary | ICD-10-CM

## 2020-09-16 DIAGNOSIS — I472 Ventricular tachycardia: Secondary | ICD-10-CM

## 2020-09-16 DIAGNOSIS — I5042 Chronic combined systolic (congestive) and diastolic (congestive) heart failure: Secondary | ICD-10-CM

## 2020-09-16 DIAGNOSIS — I447 Left bundle-branch block, unspecified: Secondary | ICD-10-CM

## 2020-09-22 ENCOUNTER — Telehealth: Payer: Self-pay | Admitting: Cardiology

## 2020-09-22 NOTE — Telephone Encounter (Signed)
Left message to call back  Samples placed at front desk

## 2020-09-22 NOTE — Telephone Encounter (Signed)
    Patient calling the office for samples of medication:   1.  What medication and dosage are you requesting samples for? Entresto  2.  Are you currently out of this medication? Yes  Pt said he couldn't get entresto until Thursday and need some samples he is out of medication

## 2020-09-24 ENCOUNTER — Other Ambulatory Visit: Payer: Self-pay

## 2020-09-24 MED ORDER — ENTRESTO 49-51 MG PO TABS: 1.0000 | ORAL_TABLET | Freq: Two times a day (BID) | ORAL | 2 refills | Status: DC

## 2020-09-30 ENCOUNTER — Other Ambulatory Visit (HOSPITAL_COMMUNITY): Payer: Self-pay

## 2020-09-30 ENCOUNTER — Telehealth: Payer: Self-pay | Admitting: Cardiology

## 2020-09-30 ENCOUNTER — Encounter (HOSPITAL_BASED_OUTPATIENT_CLINIC_OR_DEPARTMENT_OTHER): Payer: Medicaid Other | Admitting: Cardiovascular Disease

## 2020-09-30 ENCOUNTER — Other Ambulatory Visit: Payer: Self-pay | Admitting: Cardiology

## 2020-09-30 MED ORDER — CARVEDILOL 25 MG PO TABS
ORAL_TABLET | Freq: Two times a day (BID) | ORAL | 3 refills | Status: DC
Start: 2020-09-30 — End: 2021-03-19
  Filled 2020-09-30: qty 180, 90d supply, fill #0
  Filled 2021-01-06: qty 180, 90d supply, fill #1

## 2020-09-30 NOTE — Telephone Encounter (Signed)
Patient is calling to talk with Dr. Lovena Neighbours nurse. Says that phone got ran over last week and need the app that monitors his heart back on his phone. Please give him a call back

## 2020-10-01 NOTE — Telephone Encounter (Signed)
The patient is driving and I could not help him. I told him to call when he get home and I will help him set up his app.

## 2020-10-01 NOTE — Telephone Encounter (Signed)
LMOVM for patient to call the device clinic. 

## 2020-10-02 NOTE — Telephone Encounter (Signed)
Follow up:   Patient returning a call back.  

## 2020-10-02 NOTE — Telephone Encounter (Signed)
I spoke with the patient and tried to help him set his app up. I called [employer] tech support to get additional help.

## 2020-10-16 ENCOUNTER — Telehealth: Payer: Self-pay | Admitting: *Deleted

## 2020-10-16 NOTE — Telephone Encounter (Signed)
Patient assistance application faxed to the company for entresto.

## 2020-10-17 ENCOUNTER — Encounter: Payer: Self-pay | Admitting: *Deleted

## 2020-10-21 ENCOUNTER — Other Ambulatory Visit: Payer: Self-pay | Admitting: Cardiology

## 2020-10-21 ENCOUNTER — Other Ambulatory Visit (HOSPITAL_COMMUNITY): Payer: Self-pay

## 2020-10-21 MED ORDER — SPIRONOLACTONE 25 MG PO TABS
25.0000 mg | ORAL_TABLET | Freq: Every day | ORAL | 2 refills | Status: DC
  Filled 2020-10-21: qty 90, 90d supply, fill #0
  Filled 2021-01-06: qty 90, 90d supply, fill #1
  Filled 2021-04-15: qty 90, 90d supply, fill #2

## 2020-10-22 ENCOUNTER — Other Ambulatory Visit (HOSPITAL_COMMUNITY): Payer: Self-pay

## 2020-10-27 ENCOUNTER — Other Ambulatory Visit (HOSPITAL_COMMUNITY): Payer: Self-pay

## 2020-10-27 ENCOUNTER — Other Ambulatory Visit: Payer: Self-pay | Admitting: *Deleted

## 2020-10-27 ENCOUNTER — Telehealth: Payer: Self-pay | Admitting: Cardiology

## 2020-10-27 MED ORDER — ENTRESTO 49-51 MG PO TABS
1.0000 | ORAL_TABLET | Freq: Two times a day (BID) | ORAL | 2 refills | Status: DC
  Filled 2020-10-27 – 2020-11-05 (×3): qty 180, 90d supply, fill #0
  Filled 2021-04-29: qty 180, 90d supply, fill #1

## 2020-10-27 NOTE — Telephone Encounter (Signed)
Patient wanted Dr. Jenene Slicker nurse to give him a call about the insurance and Xarelto. Please call back

## 2020-10-28 ENCOUNTER — Ambulatory Visit (INDEPENDENT_AMBULATORY_CARE_PROVIDER_SITE_OTHER): Payer: Medicaid Other

## 2020-10-28 DIAGNOSIS — G4733 Obstructive sleep apnea (adult) (pediatric): Secondary | ICD-10-CM

## 2020-10-28 LAB — CUP PACEART REMOTE DEVICE CHECK
Battery Remaining Longevity: 54 mo
Battery Remaining Percentage: 92 %
Battery Voltage: 2.98 V
Brady Statistic AP VP Percent: 62 %
Brady Statistic AP VS Percent: 1 %
Brady Statistic AS VP Percent: 35 %
Brady Statistic AS VS Percent: 1 %
Brady Statistic RA Percent Paced: 59 %
Date Time Interrogation Session: 20220628063020
HighPow Impedance: 78 Ohm
Implantable Lead Implant Date: 20220328
Implantable Lead Implant Date: 20220328
Implantable Lead Implant Date: 20220328
Implantable Lead Location: 753858
Implantable Lead Location: 753859
Implantable Lead Location: 753860
Implantable Pulse Generator Implant Date: 20220328
Lead Channel Impedance Value: 1225 Ohm
Lead Channel Impedance Value: 410 Ohm
Lead Channel Impedance Value: 490 Ohm
Lead Channel Pacing Threshold Amplitude: 0.5 V
Lead Channel Pacing Threshold Amplitude: 0.75 V
Lead Channel Pacing Threshold Amplitude: 0.75 V
Lead Channel Pacing Threshold Pulse Width: 0.5 ms
Lead Channel Pacing Threshold Pulse Width: 0.5 ms
Lead Channel Pacing Threshold Pulse Width: 0.5 ms
Lead Channel Sensing Intrinsic Amplitude: 1.3 mV
Lead Channel Sensing Intrinsic Amplitude: 10.9 mV
Lead Channel Setting Pacing Amplitude: 3.5 V
Lead Channel Setting Pacing Amplitude: 3.5 V
Lead Channel Setting Pacing Amplitude: 3.5 V
Lead Channel Setting Pacing Pulse Width: 0.5 ms
Lead Channel Setting Pacing Pulse Width: 0.5 ms
Lead Channel Setting Sensing Sensitivity: 0.5 mV
Pulse Gen Serial Number: 810019794

## 2020-10-30 ENCOUNTER — Telehealth: Payer: Self-pay | Admitting: *Deleted

## 2020-10-30 NOTE — Telephone Encounter (Signed)
PA for entresto complete and approved.

## 2020-11-05 ENCOUNTER — Other Ambulatory Visit (HOSPITAL_COMMUNITY): Payer: Self-pay

## 2020-11-06 ENCOUNTER — Other Ambulatory Visit (HOSPITAL_COMMUNITY): Payer: Self-pay

## 2020-11-14 ENCOUNTER — Ambulatory Visit (INDEPENDENT_AMBULATORY_CARE_PROVIDER_SITE_OTHER): Payer: Medicaid Other | Admitting: Cardiology

## 2020-11-14 ENCOUNTER — Encounter: Payer: Self-pay | Admitting: Cardiology

## 2020-11-14 ENCOUNTER — Other Ambulatory Visit: Payer: Self-pay

## 2020-11-14 ENCOUNTER — Encounter: Payer: Medicaid Other | Admitting: Cardiology

## 2020-11-14 VITALS — BP 120/64 | HR 70 | Ht 67.0 in | Wt 229.0 lb

## 2020-11-14 DIAGNOSIS — I5022 Chronic systolic (congestive) heart failure: Secondary | ICD-10-CM

## 2020-11-14 DIAGNOSIS — I493 Ventricular premature depolarization: Secondary | ICD-10-CM | POA: Diagnosis not present

## 2020-11-14 DIAGNOSIS — I472 Ventricular tachycardia: Secondary | ICD-10-CM

## 2020-11-14 DIAGNOSIS — Z79899 Other long term (current) drug therapy: Secondary | ICD-10-CM

## 2020-11-14 DIAGNOSIS — I420 Dilated cardiomyopathy: Secondary | ICD-10-CM

## 2020-11-14 DIAGNOSIS — Z95810 Presence of automatic (implantable) cardiac defibrillator: Secondary | ICD-10-CM | POA: Diagnosis not present

## 2020-11-14 DIAGNOSIS — I4729 Other ventricular tachycardia: Secondary | ICD-10-CM

## 2020-11-14 NOTE — Patient Instructions (Addendum)
Medication Instructions:  Your physician recommends that you continue on your current medications as directed. Please refer to the Current Medication list given to you today. *If you need a refill on your cardiac medications before your next appointment, please call your pharmacy*  Lab Work: You will get lab work today:  CMP, TSH, and free T4 If you have labs (blood work) drawn today and your tests are completely normal, you will receive your results only by: MyChart Message (if you have MyChart) OR A paper copy in the mail If you have any lab test that is abnormal or we need to change your treatment, we will call you to review the results.  Testing/Procedures: Your physician has requested that you have an echocardiogram. Echocardiography is a painless test that uses sound waves to create images of your heart. It provides your doctor with information about the size and shape of your heart and how well your heart's chambers and valves are working. This procedure takes approximately one hour. There are no restrictions for this procedure.  Please schedule for ECHO  Follow-Up: At [facility], you and your health needs are our priority.  As part of our continuing mission to provide you with exceptional heart care, we have created designated Provider Care Teams.  These Care Teams include your primary Cardiologist (physician) and Advanced Practice Providers (APPs -  Physician Assistants and Nurse Practitioners) who all work together to provide you with the care you need, when you need it.  Your next appointment:   Your physician wants you to follow-up in: one year with Dr. Lalla Brothers.   You will receive a reminder letter in the mail two months in advance. If you don't receive a letter, please call our office to schedule the follow-up appointment.  Remote monitoring is used to monitor your ICD from home. This monitoring reduces the number of office visits required to check your device to one time per  year. It allows [place] to keep an eye on the functioning of your device to ensure it is working properly. You are scheduled for a device check from home on 01/27/2021. You may send your transmission at any time that day. If you have a wireless device, the transmission will be sent automatically. After your physician reviews your transmission, you will receive a postcard with your next transmission date.

## 2020-11-14 NOTE — Progress Notes (Signed)
Electrophysiology Office Follow up Visit Note:    Date:  11/14/2020   ID:  COVEY BALLER, DOB 1966-04-24, MRN [id number]  PCP:  Patient, No Pcp Per (Inactive)  [facility] Cardiologist:  Rollene Rotunda, MD  [facility] Electrophysiologist:  Lanier Prude, MD    Interval History:    Cody Carpenter is a 55 y.o. male who presents for a follow up visit after his CRT-D implant 07/28/2020. Patient has been doing well since implant. Device interrogations show device is well functioning with 97% biV pacing on the last remote. The patient tells me that he is overall doing okay since implant.  He still is fatigued after he is up and moving for a few hours.  No syncope or presyncope.  No shocks from his device.  The device pocket is well-healed.     Past Medical History:  Diagnosis Date   CHF (congestive heart failure) (HCC)    Hypertension    Sleep apnea    CPAP    Past Surgical History:  Procedure Laterality Date   BIV ICD INSERTION CRT-D N/A 07/28/2020   Procedure: BIV ICD INSERTION CRT-D;  Surgeon: Lanier Prude, MD;  Location: [facility] INVASIVE CV LAB;  Service: Cardiovascular;  Laterality: N/A;   NO PAST SURGERIES      Current Medications: Current Meds  Medication Sig   amiodarone (PACERONE) 200 MG tablet Take 2 tablets (400 mg total) by mouth 2 (two) times daily for 7 days, THEN 1 tablet (200 mg total) daily.   carvedilol (COREG) 25 MG tablet Take 1 tablet (25 mg total) by mouth 2 (two) times daily with a meal.   sacubitril-valsartan (ENTRESTO) 49-51 MG Take 1 tablet by mouth 2 (two) times daily.   spironolactone (ALDACTONE) 25 MG tablet TAKE 1 TABLET BY MOUTH AT BEDTIME.     Allergies:   Patient has no known allergies.   Social History   Socioeconomic History   Marital status: Single    Spouse name: Not on file   Number of children: 3   Years of education: Not on file   Highest education level: Not on file  Occupational History   Occupation: roofer  Tobacco  Use   Smoking status: Every Day    Packs/day: 1.00    Years: 25.00    Pack years: 25.00    Types: Cigarettes   Smokeless tobacco: Never  Substance and Sexual Activity   Alcohol use: Yes   Drug use: No   Sexual activity: Not on file  Other Topics Concern   Not on file  Social History Narrative   Not on file   Social Determinants of Health   Financial Resource Strain: High Risk   Difficulty of Paying Living Expenses: Very hard  Food Insecurity: No Food Insecurity   Worried About Programme researcher, broadcasting/film/video in the Last Year: Never true   Ran Out of Food in the Last Year: Never true  Transportation Needs: No Transportation Needs   Lack of Transportation (Medical): No   Lack of Transportation (Non-Medical): No  Physical Activity: Not on file  Stress: Not on file  Social Connections: Not on file     Family History: The patient's family history includes Heart disease in his maternal grandfather; Lung cancer in his maternal grandmother.  ROS:   Please see the history of present illness.    All other systems reviewed and are negative.  EKGs/Labs/Other Studies Reviewed:    The following studies were reviewed today:  07/29/2020  CXR Well positioned CRT-D system.    EKG:  The ekg ordered today demonstrates a paced, V paced, biventricular pacing, QRS duration 108 ms  Recent Labs: 12/17/2019: Magnesium 2.0 07/22/2020: BUN 15; Creatinine, Ser 0.96; Hemoglobin 14.6; Platelets 192; Potassium 4.5; Sodium 138  Recent Lipid Panel    Component Value Date/Time   CHOL  03/02/2007 0838    170        ATP III CLASSIFICATION:  <200     mg/dL   Desirable  893-810  mg/dL   Borderline High  >=175    mg/dL   High   TRIG 102 (H) 58/52/7782 0838   HDL 26 (L) 03/02/2007 0838   CHOLHDL 6.5 03/02/2007 0838   VLDL 58 (H) 03/02/2007 0838   LDLCALC  03/02/2007 0838    86        Total Cholesterol/HDL:CHD Risk Coronary Heart Disease Risk Table                     Men   Women  1/2 Average Risk   3.4    3.3    Physical Exam:    VS:  BP 120/64   Pulse 70   Ht 5\' 7"  (1.702 m)   Wt 229 lb (103.9 kg)   SpO2 93%   BMI 35.87 kg/m     Wt Readings from Last 3 Encounters:  11/14/20 229 lb (103.9 kg)  08/05/20 230 lb (104.3 kg)  07/28/20 225 lb (102.1 kg)     GEN:  Well nourished, well developed in no acute distress HEENT: Normal NECK: No JVD; No carotid bruits LYMPHATICS: No lymphadenopathy CARDIAC: RRR, no murmurs, rubs, gallops.  CRT-D pocket well-healed RESPIRATORY:  Clear to auscultation without rales, wheezing or rhonchi  ABDOMEN: Soft, non-tender, non-distended MUSCULOSKELETAL:  No edema; No deformity  SKIN: Warm and dry NEUROLOGIC:  Alert and oriented x 3 PSYCHIATRIC:  Normal affect   ASSESSMENT:    1. Chronic systolic (congestive) heart failure (HCC)   2. NSVT (nonsustained ventricular tachycardia) (HCC)   3. ICD (implantable cardioverter-defibrillator) in place   4. PVC's (premature ventricular contractions)    PLAN:    In order of problems listed above:  1. Chronic systolic (congestive) heart failure (HCC) NYHA class II-III today.  Warm and dry.  We will plan to repeat the echocardiogram given his been several months since CRT-D implant with good biventricular pacing percentage and QRS morphology on EKG.  Continue medical therapy per Dr. 07/30/20 recommendations (spironolactone, Jenene Slicker, Coreg).  2. NSVT (nonsustained ventricular tachycardia) (HCC) Continue amiodarone 200 mg by mouth daily.  We will check CMP, TSH and free T4 today.  3. ICD (implantable cardioverter-defibrillator) in place Device functioning well on check today.  Greater than 97% BiV pacing.  QRS morphology is excellent on EKG with a QRS duration of 108 ms.  4. PVC's (premature ventricular contractions) Amiodarone as above.  5.  High risk medication monitoring CMP, TSH, free T4 today.     Medication Adjustments/Labs and Tests Ordered: Current medicines are reviewed at length with the  patient today.  Concerns regarding medicines are outlined above.  Orders Placed This Encounter  Procedures   EKG 12-Lead   No orders of the defined types were placed in this encounter.    Signed, Sherryll Burger, MD, [facility], [facility] 11/14/2020 4:12 PM    Electrophysiology [facility]

## 2020-11-15 LAB — COMPREHENSIVE METABOLIC PANEL
ALT: 21 IU/L (ref 0–44)
AST: 17 IU/L (ref 0–40)
Albumin/Globulin Ratio: 1.4 (ref 1.2–2.2)
Albumin: 4.2 g/dL (ref 3.8–4.9)
Alkaline Phosphatase: 100 IU/L (ref 44–121)
BUN/Creatinine Ratio: 17 (ref 9–20)
BUN: 16 mg/dL (ref 6–24)
Bilirubin Total: 0.4 mg/dL (ref 0.0–1.2)
CO2: 17 mmol/L — ABNORMAL LOW (ref 20–29)
Calcium: 9.3 mg/dL (ref 8.7–10.2)
Chloride: 106 mmol/L (ref 96–106)
Creatinine, Ser: 0.94 mg/dL (ref 0.76–1.27)
Globulin, Total: 3 g/dL (ref 1.5–4.5)
Glucose: 85 mg/dL (ref 65–99)
Potassium: 4.3 mmol/L (ref 3.5–5.2)
Sodium: 141 mmol/L (ref 134–144)
Total Protein: 7.2 g/dL (ref 6.0–8.5)
eGFR: 97 mL/min/{1.73_m2} (ref >59)

## 2020-11-15 LAB — TSH: TSH: 2.78 u[IU]/mL (ref 0.450–4.500)

## 2020-11-15 LAB — T4, FREE: Free T4: 1.37 ng/dL (ref 0.82–1.77)

## 2020-11-17 NOTE — Progress Notes (Signed)
Remote ICD transmission.   

## 2020-11-26 ENCOUNTER — Telehealth: Payer: Self-pay

## 2020-11-26 ENCOUNTER — Other Ambulatory Visit (HOSPITAL_COMMUNITY): Payer: Self-pay

## 2020-11-26 MED ORDER — AMOXICILLIN 500 MG PO CAPS
ORAL_CAPSULE | ORAL | 0 refills | Status: DC
Start: 2020-11-26 — End: 2021-01-22
  Filled 2020-11-26: qty 21, 7d supply, fill #0

## 2020-11-26 NOTE — Telephone Encounter (Signed)
   [facility] Pre-operative Risk Assessment    Patient Name: ATHONY COPPA  DOB: Sep 02, 1965 MRN: [id number]  HEARTCARE STAFF:  - IMPORTANT!!!!!! Under Visit Info/Reason for Call, type in Other and utilize the format Clearance MM/DD/YY or Clearance TBD. Do not use dashes or single digits. - Please review there is not already an duplicate clearance open for this procedure. - If request is for dental extraction, please clarify the # of teeth to be extracted. - If the patient is currently at the dentist's office, call Pre-Op Callback Staff (MA/nurse) to input urgent request.  - If the patient is not currently in the dentist office, please route to the Pre-Op pool.  Request for surgical clearance:  What type of surgery is being performed? Teeth extraction of remaining teeth (13 teeth) and Alveoloplasty  When is this surgery scheduled? TBD   What type of clearance is required (medical clearance vs. Pharmacy clearance to hold med vs. Both)?  Medical clearance   Are there any medications that need to be held prior to surgery and how long? None listed   Practice name and name of physician performing surgery? [facility], Perlie Gold, DDS  What is the office phone number? [phone]   7.   What is the office fax number? [phone]  8.   Anesthesia type (None, local, MAC, general) ? N2O  Patient disclosed in office that he has congestive heart failure and pacemaker placed April 2022. Requesting complete medication list and clearance   Jacqulynn Cadet 11/26/2020, 3:56 PM  _________________________________________________________________

## 2020-11-27 ENCOUNTER — Other Ambulatory Visit (HOSPITAL_COMMUNITY): Payer: Self-pay

## 2020-11-27 NOTE — Telephone Encounter (Signed)
   Primary Cardiologist: Rollene Rotunda, MD  Chart reviewed as part of pre-operative protocol coverage. Given past medical history and time since last visit, based on ACC/AHA guidelines, Cody Carpenter would be at acceptable risk for the planned procedure without further cardiovascular testing.   I will route this recommendation to the requesting party via Epic fax function and remove from pre-op pool.  Please call with questions.  Cody Carpenter. Cody Pattison NP-C    11/27/2020, 10:28 AM [facility] [address] Office [phone] Fax [phone]

## 2020-12-02 ENCOUNTER — Encounter (HOSPITAL_BASED_OUTPATIENT_CLINIC_OR_DEPARTMENT_OTHER): Payer: Medicaid Other | Admitting: Cardiovascular Disease

## 2020-12-03 ENCOUNTER — Telehealth: Payer: Self-pay | Admitting: *Deleted

## 2020-12-03 NOTE — Telephone Encounter (Signed)
Received a call from Terri at [facility] sleep disorders stating the patient has a sleep study scheduled on 12/22/20. This appointment is a reschedule. It has been cancelled and rescheduled 3 times. Prior Authorization for split night sleep study sent to [facility] via web portal. Tracking Number [id number].  Reference # [id number].

## 2020-12-05 ENCOUNTER — Other Ambulatory Visit: Payer: Self-pay

## 2020-12-05 ENCOUNTER — Ambulatory Visit (HOSPITAL_COMMUNITY): Payer: Medicaid Other | Attending: Cardiovascular Disease

## 2020-12-05 DIAGNOSIS — I493 Ventricular premature depolarization: Secondary | ICD-10-CM | POA: Diagnosis present

## 2020-12-05 DIAGNOSIS — I5022 Chronic systolic (congestive) heart failure: Secondary | ICD-10-CM | POA: Insufficient documentation

## 2020-12-05 DIAGNOSIS — I4729 Other ventricular tachycardia: Secondary | ICD-10-CM

## 2020-12-05 DIAGNOSIS — Z79899 Other long term (current) drug therapy: Secondary | ICD-10-CM | POA: Diagnosis present

## 2020-12-05 DIAGNOSIS — I472 Ventricular tachycardia: Secondary | ICD-10-CM | POA: Insufficient documentation

## 2020-12-05 LAB — ECHOCARDIOGRAM COMPLETE
Area-P 1/2: 2.59 cm2
S' Lateral: 5.2 cm

## 2020-12-07 NOTE — Progress Notes (Signed)
Cardiology Office Note   Date:  12/09/2020   ID:  Cody Carpenter, DOB 08-13-1965, MRN [id number]  PCP:  Patient, No Pcp Per (Inactive)  Cardiologist:   Rollene Rotunda, MD   Chief Complaint  Patient presents with   Fatigue       History of Present Illness: Cody Carpenter is a 55 y.o. 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  His EF had been 20% in 2008 but improved with medical management. Unfortunately he eventually stopped his meds and his EF went from a high of 55% to 25% in 2015 and after med titration only went up to 30% in 2016.  However, in August his EF was 20 - 25%.   I have had a difficult time titrating his meds because of hypotension.  I did send him for a CPX recently.  He had a submax effort.  Peak VO2 was 17.5.     He is now status post BiV ICD.   He has had significant social stress.  He is under financial stress.  He is waiting on disability decision.  His predominant complaint has been profound fatigue.  He says he is tired after walking 25 yards.  He is not describing PND or orthopnea.  Not describing palpitations, presyncope or syncope.  He still has not had rescheduling above sleep study to see if his CPAP needs to be changed.   Past Medical History:  Diagnosis Date   CHF (congestive heart failure) (HCC)    Hypertension    Sleep apnea    CPAP    Past Surgical History:  Procedure Laterality Date   BIV ICD INSERTION CRT-D N/A 07/28/2020   Procedure: BIV ICD INSERTION CRT-D;  Surgeon: Lanier Prude, MD;  Location: [facility] INVASIVE CV LAB;  Service: Cardiovascular;  Laterality: N/A;   NO PAST SURGERIES       Current Outpatient Medications  Medication Sig Dispense Refill   amiodarone (PACERONE) 200 MG tablet Take 2 tablets (400 mg total) by mouth 2 (two) times daily for 7 days, THEN 1 tablet (200 mg total) daily. 111 tablet 3   amoxicillin (AMOXIL) 500 MG capsule Take 1 capsule by mouth three times a day until  gone. 21 capsule 0   carvedilol (COREG) 25 MG tablet Take 1 tablet (25 mg total) by mouth 2 (two) times daily with a meal. 180 tablet 3   carvedilol (COREG) 25 MG tablet TAKE 1 TABLET BY MOUTH TWICE DAILY WITH A MEAL 180 tablet 3   sacubitril-valsartan (ENTRESTO) 49-51 MG Take 1 tablet by mouth 2 (two) times daily. 180 tablet 2   spironolactone (ALDACTONE) 25 MG tablet TAKE 1 TABLET BY MOUTH AT BEDTIME. 90 tablet 3   spironolactone (ALDACTONE) 25 MG tablet Take 1 tablet (25 mg total) by mouth at bedtime. 90 tablet 2   No current facility-administered medications for this visit.    Allergies:   Patient has no known allergies.    ROS:  Please see the history of present illness.   Otherwise, review of systems are positive for none.   All other systems are reviewed and negative.    PHYSICAL EXAM: VS:  BP 100/70 (BP Location: Right Arm)   Pulse 70   Ht 5\' 7"  (1.702 m)   Wt 231 lb 9.6 oz (105.1 kg)   SpO2 96%   BMI 36.27 kg/m  , BMI Body mass index is 36.27 kg/m. GENERAL:  Well appearing  NECK:  No jugular venous distention, waveform within normal limits, carotid upstroke brisk and symmetric, no bruits, no thyromegaly LUNGS:  Clear to auscultation bilaterally CHEST:  Well healed ICD pocket.   HEART:  PMI not displaced or sustained,S1 and S2 within normal limits, no S3, no S4, no clicks, no rubs, no murmurs ABD:  Flat, positive bowel sounds normal in frequency in pitch, no bruits, no rebound, no guarding, no midline pulsatile mass, no hepatomegaly, no splenomegaly EXT:  2 plus pulses throughout, no edema, no cyanosis no clubbing    EKG:  EKG is not ordered today. NA  Recent Labs: 12/17/2019: Magnesium 2.0 07/22/2020: Hemoglobin 14.6; Platelets 192 11/14/2020: ALT 21; BUN 16; Creatinine, Ser 0.94; Potassium 4.3; Sodium 141; TSH 2.780    Lipid Panel    Component Value Date/Time   CHOL  03/02/2007 0838    170        ATP III CLASSIFICATION:  <200     mg/dL   Desirable  500-938   mg/dL   Borderline High  >=182    mg/dL   High   TRIG 993 (H) 71/69/6789 0838   HDL 26 (L) 03/02/2007 0838   CHOLHDL 6.5 03/02/2007 0838   VLDL 58 (H) 03/02/2007 0838   LDLCALC  03/02/2007 0838    86        Total Cholesterol/HDL:CHD Risk Coronary Heart Disease Risk Table                     Men   Women  1/2 Average Risk   3.4   3.3      Wt Readings from Last 3 Encounters:  12/09/20 231 lb 9.6 oz (105.1 kg)  11/14/20 229 lb (103.9 kg)  08/05/20 230 lb (104.3 kg)      Other studies Reviewed: Additional studies/ records that were reviewed today include: Labs Review of the above records demonstrates: See elsewhere  ASSESSMENT AND PLAN:    CHRONIC SYSTOLIC HF: He has a nonischemic cardiomyopathy.  He had no coronary disease on cath previously.  He has had a CPX in the past.  He was not thought to be a candidate for advanced therapies yet.  However, he continues to have increasing fatigue.  He does not have any evidence of volume overload but may have low output.  I discussed with him referral for advanced therapies.  He does not Consider this as he is under so much emotional stress he says he could not handle anything else.  His blood pressure will not allow med titration.   He would let me know if he has any worsening symptoms and in particular events volume overload.  I told him I would work with him in any way necessary to help with his disability.  He has a IT trainer who is working on it.  PVCs:   He is now status post CRT D.   He is pacing greater than 97% of the time.   HTN:  This is being managed in the context of treating his CHF  TOBACCO ABUSE:   We talked multiple times about the need to stop smoking.  SLEEP APNEA:     A sleep study has been ordered and is rescheduled.       Current medicines are reviewed at length with the patient today.  The patient does not have concerns regarding medicines.  The following changes have been made:   None  Labs/ tests ordered today  include:  None  No orders  of the defined types were placed in this encounter.    Disposition:   FU with me in 4 months.  months.     Signed, Rollene Rotunda, MD  12/09/2020 2:41 PM    [facility]

## 2020-12-08 NOTE — Telephone Encounter (Signed)
Auth received from [facility]. Auth # [id number]. Valid dates 12/22/20 to 02/21/21. Terri notified.

## 2020-12-09 ENCOUNTER — Ambulatory Visit: Payer: Medicaid Other | Admitting: Cardiology

## 2020-12-09 ENCOUNTER — Encounter: Payer: Self-pay | Admitting: Cardiology

## 2020-12-09 ENCOUNTER — Other Ambulatory Visit: Payer: Self-pay

## 2020-12-09 VITALS — BP 100/70 | HR 70 | Ht 67.0 in | Wt 231.6 lb

## 2020-12-09 DIAGNOSIS — I4729 Other ventricular tachycardia: Secondary | ICD-10-CM

## 2020-12-09 DIAGNOSIS — I5022 Chronic systolic (congestive) heart failure: Secondary | ICD-10-CM

## 2020-12-09 DIAGNOSIS — I472 Ventricular tachycardia: Secondary | ICD-10-CM | POA: Diagnosis not present

## 2020-12-09 NOTE — Patient Instructions (Signed)
Medication Instructions:  Continue current medications  *If you need a refill on your cardiac medications before your next appointment, please call your pharmacy*   Lab Work: None Ordered   Testing/Procedures: None Ordered   Follow-Up: At [facility], you and your health needs are our priority.  As part of our continuing mission to provide you with exceptional heart care, we have created designated Provider Care Teams.  These Care Teams include your primary Cardiologist (physician) and Advanced Practice Providers (APPs -  Physician Assistants and Nurse Practitioners) who all work together to provide you with the care you need, when you need it.  We recommend signing up for the patient portal called "MyChart".  Sign up information is provided on this After Visit Summary.  MyChart is used to connect with patients for Virtual Visits (Telemedicine).  Patients are able to view lab/test results, encounter notes, upcoming appointments, etc.  Non-urgent messages can be sent to your provider as well.   To learn more about what you can do with MyChart, go to [email or web address].    Your next appointment:   4 month(s)  The format for your next appointment:   In Person  Provider:   You may see James Hochrein, MD or one of the following Advanced Practice Providers on your designated Care Team:   Rhonda Barrett, PA-C Jennifer, Lambert, PA-C Kathryn Lawrence, DNP, ANP   

## 2020-12-22 ENCOUNTER — Ambulatory Visit: Payer: Medicaid Other | Admitting: Cardiovascular Disease

## 2021-01-04 ENCOUNTER — Ambulatory Visit (HOSPITAL_BASED_OUTPATIENT_CLINIC_OR_DEPARTMENT_OTHER): Payer: Medicaid Other | Attending: Cardiovascular Disease | Admitting: Cardiovascular Disease

## 2021-01-04 ENCOUNTER — Other Ambulatory Visit: Payer: Self-pay

## 2021-01-04 DIAGNOSIS — Z79899 Other long term (current) drug therapy: Secondary | ICD-10-CM | POA: Diagnosis not present

## 2021-01-04 DIAGNOSIS — G4719 Other hypersomnia: Secondary | ICD-10-CM

## 2021-01-04 DIAGNOSIS — I10 Essential (primary) hypertension: Secondary | ICD-10-CM | POA: Diagnosis not present

## 2021-01-04 DIAGNOSIS — R002 Palpitations: Secondary | ICD-10-CM

## 2021-01-04 DIAGNOSIS — I5042 Chronic combined systolic (congestive) and diastolic (congestive) heart failure: Secondary | ICD-10-CM

## 2021-01-04 DIAGNOSIS — G4733 Obstructive sleep apnea (adult) (pediatric): Secondary | ICD-10-CM | POA: Insufficient documentation

## 2021-01-06 ENCOUNTER — Other Ambulatory Visit (HOSPITAL_COMMUNITY): Payer: Self-pay

## 2021-01-07 ENCOUNTER — Other Ambulatory Visit (HOSPITAL_COMMUNITY): Payer: Self-pay

## 2021-01-22 ENCOUNTER — Other Ambulatory Visit (HOSPITAL_COMMUNITY): Payer: Self-pay

## 2021-01-22 MED ORDER — AMOXICILLIN 500 MG PO CAPS
ORAL_CAPSULE | ORAL | 0 refills | Status: DC
  Filled 2021-01-22: qty 21, 7d supply, fill #0

## 2021-01-22 MED ORDER — HYDROCODONE-ACETAMINOPHEN 5-325 MG PO TABS
ORAL_TABLET | ORAL | 0 refills | Status: AC
  Filled 2021-01-22: qty 6, 2d supply, fill #0

## 2021-01-22 MED ORDER — IBUPROFEN 800 MG PO TABS
ORAL_TABLET | ORAL | 0 refills | Status: DC
  Filled 2021-01-22: qty 15, 5d supply, fill #0

## 2021-01-27 ENCOUNTER — Ambulatory Visit (INDEPENDENT_AMBULATORY_CARE_PROVIDER_SITE_OTHER): Payer: Medicaid Other

## 2021-01-27 ENCOUNTER — Encounter (HOSPITAL_BASED_OUTPATIENT_CLINIC_OR_DEPARTMENT_OTHER): Payer: Self-pay | Admitting: Cardiovascular Disease

## 2021-01-27 DIAGNOSIS — I447 Left bundle-branch block, unspecified: Secondary | ICD-10-CM

## 2021-01-27 NOTE — Procedures (Signed)
Patient Name: Cody Carpenter, Cody Carpenter Date: 01/04/2021 Gender: Male D.O.B: July 20, 1965 Age (years): 54 Referring Provider: Shelva Majestic MD, ABSM Height (inches): 42 Interpreting Physician: Shelva Majestic MD, ABSM Weight (lbs): 230 RPSGT: Gwenyth Allegra BMI: 36 MRN: [id number] Neck Size: 18.00  CLINICAL INFORMATION Sleep Study Type: Split Night CPAP  Indication for sleep study: Hypertension, OSA, Witnessed Apneas  Epworth Sleepiness Score: 21  SLEEP STUDY TECHNIQUE As per the AASM Manual for the Scoring of Sleep and Associated Events v2.3 (April 2016) with a hypopnea requiring 4% desaturations.  The channels recorded and monitored were frontal, central and occipital EEG, electrooculogram (EOG), submentalis EMG (chin), nasal and oral airflow, thoracic and abdominal wall motion, anterior tibialis EMG, snore microphone, electrocardiogram, and pulse oximetry. Continuous positive airway pressure (CPAP) was initiated when the patient met split night criteria and was titrated according to treat sleep-disordered breathing.  MEDICATIONS amiodarone (PACERONE) 200 MG tablet amoxicillin (AMOXIL) 500 MG capsule carvedilol (COREG) 25 MG tablet HYDROcodone-acetaminophen (NORCO/VICODIN) 5-325 MG tablet ibuprofen (ADVIL) 800 MG tablet sacubitril-valsartan (ENTRESTO) 49-51 MG spironolactone (ALDACTONE) 25 MG tablet Medications self-administered by patient taken the night of the study : N/A  RESPIRATORY PARAMETERS Diagnostic Total AHI (/hr): 48.3 RDI (/hr): 49.7 OA Index (/hr): 0.5 CA Index (/hr): 0.0 REM AHI (/hr): 20.4 NREM AHI (/hr): 70.0 Supine AHI (/hr): 90.0 Non-supine AHI (/hr): 17.8 Min O2 Sat (%): 80.0 Mean O2 (%): 88.4 Time below 88% (min): 84.4   Titration Optimal Pressure (cm):  AHI at Optimal Pressure (/hr): N/A Min O2 at Optimal Pressure (%): 84.0 Supine % at Optimal (%): N/A Sleep % at Optimal (%): N/A   SLEEP ARCHITECTURE The recording time for the entire night was  362.1 minutes.  During a baseline period of 159.1 minutes, the patient slept for 128.0 minutes in REM and nonREM, yielding a sleep efficiency of 80.4%%. Sleep onset after lights out was 25.9 minutes with a REM latency of 74.5 minutes. The patient spent 3.9%% of the night in stage N1 sleep, 52.3%% in stage N2 sleep, 0.0%% in stage N3 and 43.8% in REM.  During the titration period of 202.0 minutes, the patient slept for 132.5 minutes in REM and nonREM, yielding a sleep efficiency of 65.6%%. Sleep onset after CPAP initiation was 8.8 minutes with a REM latency of 62.5 minutes. The patient spent 6.8%% of the night in stage N1 sleep, 38.9%% in stage N2 sleep, 0.0%% in stage N3 and 54.3% in REM.  CARDIAC DATA The 2 lead EKG demonstrated pacemaker generated. The mean heart rate was 100.0 beats per minute. Other EKG findings include: None.  LEG MOVEMENT DATA The total Periodic Limb Movements of Sleep (PLMS) were 0. The PLMS index was 0.0 .  IMPRESSIONS - Severe obstructive sleep apnea occurred during the diagnostic portion of the study (AHI 48.3/h, RDI 49.7/h) with events very severe with supine sleep (AHI 90.0/h).  CPAP was initiated at 9 cm and was titrated to 19 cm of water. REM sleep occurred at 17 and 19 cm with lateral position sleep. AHI at 17 cm 2.9/h and at 19 cm 1.9/h without supine sleep. - Moderate oxygen desaturation was noted during the diagnostic portion of the study to a nadir of 80.0%. - The patient snored with loud snoring volume during the diagnostic portion of the study. - No cardiac abnormalities were noted during this study. - Clinically significant periodic limb movements did not occur during sleep.  DIAGNOSIS - Obstructive Sleep Apnea (G47.33)  RECOMMENDATIONS - Recommend an initial  trial of CPAP Auto with EPR of 3 at 17 - 20 cm of water with heated humidification. BiPAP therapy my ultimately be necessary.  - Effort should be made to optimize nasal and oropharyngeal patency. -  The patient should be counseled to avoid supine sleep.  - Avoid alcohol, sedatives and other CNS depressants that may worsen sleep apnea and disrupt normal sleep architecture. - Sleep hygiene should be reviewed to assess factors that may improve sleep quality. - Weight management (BMI 36) and regular exercise should be initiated or continued. - Recommend a download and sleep clinic evaluation after 30 days of therapy.  [Electronically signed] 01/27/2021 06:02 PM  Shelva Majestic MD, [facility], [place], American Board of Sleep Medicine   NPI: [id number] [place] PH: [phone]   FX: [phone] [place]

## 2021-01-28 ENCOUNTER — Telehealth: Payer: Self-pay | Admitting: *Deleted

## 2021-01-28 LAB — CUP PACEART REMOTE DEVICE CHECK
Battery Remaining Longevity: 80 mo
Battery Remaining Percentage: 89 %
Battery Voltage: 2.99 V
Brady Statistic AP VP Percent: 63 %
Brady Statistic AP VS Percent: 1 %
Brady Statistic AS VP Percent: 28 %
Brady Statistic AS VS Percent: 1 %
Brady Statistic RA Percent Paced: 53 %
Date Time Interrogation Session: 20220927020151
HighPow Impedance: 84 Ohm
Implantable Lead Implant Date: 20220328
Implantable Lead Implant Date: 20220328
Implantable Lead Implant Date: 20220328
Implantable Lead Location: 753858
Implantable Lead Location: 753859
Implantable Lead Location: 753860
Implantable Pulse Generator Implant Date: 20220328
Lead Channel Impedance Value: 1250 Ohm
Lead Channel Impedance Value: 430 Ohm
Lead Channel Impedance Value: 490 Ohm
Lead Channel Pacing Threshold Amplitude: 0.75 V
Lead Channel Pacing Threshold Amplitude: 0.75 V
Lead Channel Pacing Threshold Amplitude: 1 V
Lead Channel Pacing Threshold Pulse Width: 0.5 ms
Lead Channel Pacing Threshold Pulse Width: 0.5 ms
Lead Channel Pacing Threshold Pulse Width: 0.5 ms
Lead Channel Sensing Intrinsic Amplitude: 1.4 mV
Lead Channel Sensing Intrinsic Amplitude: 11.9 mV
Lead Channel Setting Pacing Amplitude: 2 V
Lead Channel Setting Pacing Amplitude: 2 V
Lead Channel Setting Pacing Amplitude: 2.5 V
Lead Channel Setting Pacing Pulse Width: 0.5 ms
Lead Channel Setting Pacing Pulse Width: 0.5 ms
Lead Channel Setting Sensing Sensitivity: 0.5 mV
Pulse Gen Serial Number: 810019794

## 2021-01-28 NOTE — Telephone Encounter (Signed)
-----   Message from Lennette Bihari, MD sent at 01/27/2021  6:09 PM EDT ----- Burna Mortimer, please notify pt and set up for CPAP with DME

## 2021-01-28 NOTE — Telephone Encounter (Signed)
Patient notified of sleep study results and recommendations. He agrees to proceed with the initiation of CPAP therapy. CPAP or faxed to [facility].

## 2021-02-02 ENCOUNTER — Telehealth: Payer: Self-pay | Admitting: *Deleted

## 2021-02-02 NOTE — Telephone Encounter (Signed)
Staff message sent to Cody Carpenter was set up on CPAP therapy today. Needs 30-60 day compliance visit with Dr Tresa Endo.

## 2021-02-03 NOTE — Progress Notes (Signed)
Remote ICD transmission.   

## 2021-03-19 ENCOUNTER — Encounter: Payer: Self-pay | Admitting: Cardiovascular Disease

## 2021-03-19 ENCOUNTER — Other Ambulatory Visit: Payer: Self-pay

## 2021-03-19 ENCOUNTER — Ambulatory Visit: Payer: Medicaid Other | Admitting: Cardiovascular Disease

## 2021-03-19 VITALS — BP 112/60 | HR 70 | Ht 68.0 in | Wt 243.4 lb

## 2021-03-19 DIAGNOSIS — I420 Dilated cardiomyopathy: Secondary | ICD-10-CM | POA: Diagnosis not present

## 2021-03-19 DIAGNOSIS — G4733 Obstructive sleep apnea (adult) (pediatric): Secondary | ICD-10-CM | POA: Diagnosis not present

## 2021-03-19 DIAGNOSIS — R400 Somnolence: Secondary | ICD-10-CM | POA: Diagnosis not present

## 2021-03-19 DIAGNOSIS — Z95810 Presence of automatic (implantable) cardiac defibrillator: Secondary | ICD-10-CM

## 2021-03-19 DIAGNOSIS — I5022 Chronic systolic (congestive) heart failure: Secondary | ICD-10-CM | POA: Diagnosis not present

## 2021-03-19 NOTE — Progress Notes (Signed)
Cardiology Office Note    Date:  03/21/2021   ID:  Cody GRANTHAM, DOB 1966/01/28, MRN [id number]  PCP:  Carpenter, No Pcp Per (Inactive)  Cardiologist:  Cody Majestic, MD (sleep); Cody Carpenter  F/U sleep evaluation  History of Present Illness:  Cody Carpenter is a 55 y.o. male who is followed by Cody Carpenter for cardiology care.  I last saw him in March 2022 for sleep evaluation and he presents now for a 73-monthfollow-up evaluation after recently receiving a new CPAP machine on February 02, 2021.  Mr. TFralixhas a history of presumed nonischemic cardiomyopathy with remote EF at 20% in 2008.  LV function improved with medical therapy and with subsequent discontinuance of medical therapy his heart function again reduced to 20 to 25%.  He has had issues with lack of insurance.  He is applying for Medicaid.  Most recently he has been treated with carvedilol 25 mg twice a day, Entresto 49/51 mg twice a day and spironolactone 25 mg.  He was evaluated by Cody Carpenter consideration of possible ICD implant.  He was waiting for Medicaid approval prior to proceeding.  Cody Carpenter has a longstanding history of sleep apnea which was originally diagnosed in 2008.  He initially had seen Dr. KDanton Sewerand apparently his sleep apnea was severe and he was started on CPAP therapy.  Due to lack of insurance, Carpenter never followed up but last saw Dr. CNormajean Baxterin 2015.  At that time he had not had any new supplies for over 3 to 4 years.  There was discussion about follow-up evaluation but apparently this never evolved.  I last saw him in December 2021. He still had his original old CPAP machine which is an SAir cabin crew2.  Apparently he does not have any headgear or tubing.  He cannot afford another sleep evaluation presently or even if bought online rather than through Cody Carpenter.  He typically goes to bed at 10 PM and usually is awake within several hours.  His sleep is poor.  He typically is out of bed at  5 AM.  He admits to snoring, occasional nocturia, his sleep is nonrestorative.  An Epworth Sleepiness Scale was calculated in Cody office today and this endorsed at 15 as shown below:   Epworth Sleepiness Scale: Situation   Chance of Dozing/Sleeping (0 = never , 1 = slight chance , 2 = moderate chance , 3 = high chance )   sitting and reading 0   watching TV 3   sitting inactive in a public place 3   being a passenger in a motor vehicle for an hour or more 3   lying down in Cody afternoon 3   sitting and talking to someone 0   sitting quietly after lunch (no alcohol) 3   while stopped for a few minutes in traffic as Cody driver 0   Total Score  15   He was unaware of any bruxism, restless legs, hypnagogic hallucinations or cataplectic events.  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  He had not had new supplies in years and could not afford any supplies even if bought online at reduced cost or obtain a new CPAP unit.  As result I provided him with samples of a new ResMed AirFit F30 medium mask as well as new CPAP tubing.  Ultimately I felt he would benefit significant with a new CPAP or  BiPAP machine.  I had a lengthy discussion again with him regarding effects of untreated sleep apnea and his cardiovascular health.  I last saw him on July 14, 2020.  Since my prior evaluation he had received Medicaid.  Since my December 2021 evaluation, he apparently has now received Medicaid.  He was sleeping very poorly. He typically tries to go to bed between 10 and 1030 and often awakens at 2 AM and cannot really fall back to sleep.  He ultimately gets out of bed at 7:30 AM. He often wakes up at 2 AM and sleeps poorly until he is out of bed at 5:30 AM.  During that evaluation, I recommended he undergo follow-up sleep study.  Since I last saw him, Cody Carpenter underwent BiV ICD placement a nd was last evaluated by Cody Carpenter in August 2022.  He underwent a  split-night sleep study on January 04, 2021 which showed severe obstructive sleep apnea with an AHI of 4 8.3 during Cody diagnostic portion of Cody study with Cody events being very severe with supine sleep with an AHI of 90.0.  Oxygen desaturated to 90%.  He had loud snoring during Cody diagnostic portion of Cody study.  He underwent CPAP titration and required high pressures.  A CPAP auto trial with an EPR of 3 at 17 to 20 cm of water was recommended.  He received a new ResMed air sense 11 AutoSet CPAP unit with set up date on February 02, 2021.  Download was obtained from October 10 through March 18, 2021.  He is meeting compliance standards but average use is only 5 hours and 29 minutes.  He states he is always got up about 4 to 4:30 AM.  He typically goes to bed between 1030 or 11.  Typically he wakes up and then cannot fall back to sleep so he typically takes a shower but by 8 AM he is tired again.  On his most recent download, he is 95th percentile pressure is 19.4 with maximum average pressure of 19.7 and AHI was slightly elevated at 9.7/h.  He presents for evaluation.  Past Medical History:  Diagnosis Date   CHF (congestive heart failure) ([place])    Hypertension    Sleep apnea    CPAP    Past Surgical History:  Procedure Laterality Date   BIV ICD INSERTION CRT-D N/A 07/28/2020   Procedure: BIV ICD INSERTION CRT-D;  Surgeon: Vickie Epley, MD;  Location: [facility];  Service: Cardiovascular;  Laterality: N/A;   NO PAST SURGERIES      Current Medications: Outpatient Medications Prior to Visit  Medication Sig Dispense Refill   amiodarone (PACERONE) 200 MG tablet Take 2 tablets (400 mg total) by mouth 2 (two) times daily for 7 days, THEN 1 tablet (200 mg total) daily. (Carpenter not taking: Reported on 03/19/2021) 111 tablet 3   amoxicillin (AMOXIL) 500 MG capsule Take 1 capsule by mouth every 8 hours until finished 21 capsule 0   carvedilol (COREG) 25 MG tablet Take 1 tablet (25 mg  total) by mouth 2 (two) times daily with a meal. 180 tablet 3   HYDROcodone-acetaminophen (NORCO/VICODIN) 5-325 MG tablet Take 1 tablet by mouth every 6 hours as needed for pain 6 tablet 0   ibuprofen (ADVIL) 800 MG tablet Take 1 tablet by mouth every 8 hours as needed for pain 15 tablet 0   sacubitril-valsartan (ENTRESTO) 49-51 MG Take 1 tablet by mouth 2 (two) times daily. 180 tablet 2  spironolactone (ALDACTONE) 25 MG tablet Take 1 tablet (25 mg total) by mouth at bedtime. 90 tablet 2   carvedilol (COREG) 25 MG tablet TAKE 1 TABLET BY MOUTH TWICE DAILY WITH A MEAL 180 tablet 3   spironolactone (ALDACTONE) 25 MG tablet TAKE 1 TABLET BY MOUTH AT BEDTIME. 90 tablet 3   No facility-administered medications prior to visit.     Allergies:   Carpenter has no known allergies.   Social History   Socioeconomic History   Marital status: Single    Spouse name: Not on file   Number of children: 3   Years of education: Not on file   Highest education level: Not on file  Occupational History   Occupation: roofer  Tobacco Use   Smoking status: Every Day    Packs/day: 1.00    Years: 25.00    Pack years: 25.00    Types: Cigarettes   Smokeless tobacco: Never  Substance and Sexual Activity   Alcohol use: Yes   Drug use: No   Sexual activity: Not on file  Other Topics Concern   Not on file  Social History Narrative   Not on file   Social Determinants of Health   Financial Resource Strain: High Risk   Difficulty of Paying Living Expenses: Very hard  Food Insecurity: No Food Insecurity   Worried About Charity fundraiser in Cody Last Year: Never true   Ran Out of Food in Cody Last Year: Never true  Transportation Needs: No Transportation Needs   Lack of Transportation (Medical): No   Lack of Transportation (Non-Medical): No  Physical Activity: Not on file  Stress: Not on file  Social Connections: Not on file    Social history is notable that he is single.  He never married.  He has 2  grown children ages 57 and 88.  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  He lives by himself.  He was born in [place].  Family History:  Cody Carpenter's family history includes Heart disease in his maternal grandfather; Lung cancer in his maternal grandmother.  His father was killed at age 85.  His mother died of pneumonia at age 56.  He has 1 sister age 10.  ROS General: Negative; No fevers, chills, or night sweats;  HEENT: Negative; No changes in vision or hearing, sinus congestion, difficulty swallowing Pulmonary: Negative; No cough, wheezing, shortness of breath, hemoptysis Cardiovascular: See HPI, nonischemic cardiomyopathy, status post BiV ICD GI: Negative; No nausea, vomiting, diarrhea, or abdominal pain GU: Negative; No dysuria, hematuria, or difficulty voiding Musculoskeletal: Negative; no myalgias, joint pain, or weakness Hematologic/Oncology: Negative; no easy bruising, bleeding Endocrine: Negative; no heat/cold intolerance; no diabetes Neuro: Negative; no changes in balance, headaches Skin: Negative; No rashes or skin lesions Psychiatric: Negative; No behavioral problems, depression Sleep: Severe OSA on initial sleep study in 2008.  Re-confirmed Cody most recent September 2022 evaluation ; poor sleep, frequent awakenings, snoring, daytime sleepiness  Other comprehensive 14 point system review is negative.   PHYSICAL EXAM:   VS:  BP 112/60   Pulse 70   Ht '5\' 8"'  (1.727 m)   Wt 243 lb 6.4 oz (110.4 kg)   SpO2 98%   BMI 37.01 kg/m     Repeat blood pressure by me was 102/60.  Wt Readings from Last 3 Encounters:  03/19/21 243 lb 6.4 oz (110.4 kg)  01/04/21 230 lb (104.3 kg)  12/09/20 231 lb 9.6 oz (105.1 kg)    General: Alert, oriented, no  distress.  Skin: normal turgor, no rashes, warm and dry HEENT: Normocephalic, atraumatic. Pupils equal round and reactive to light; sclera anicteric; extraocular muscles intact;  Nose without nasal septal  hypertrophy Mouth/Parynx benign; Mallinpatti scale 3 Neck: Thick neck; no JVD, no carotid bruits; normal carotid upstroke Lungs: clear to ausculatation and percussion; no wheezing or rales Chest wall: without tenderness to palpitation Heart: PMI not displaced, RRR, s1 s2 normal, 1/6 systolic murmur, no diastolic murmur, no rubs, gallops, thrills, or heaves Abdomen: soft, nontender; no hepatosplenomehaly, BS+; abdominal aorta nontender and not dilated by palpation. Back: no CVA tenderness Pulses 2+ Musculoskeletal: full range of motion, normal strength, no joint deformities Extremities: no clubbing cyanosis or edema, Homan's sign negative  Neurologic: grossly nonfocal; Cranial nerves grossly wnl Psychologic: Normal mood and affect    Studies/Labs Reviewed:   March 19, 2021 ECG (independently read by me): AV paced at 70,   July 04, 2020 ECG (independently read by me): NSR at 72; ST changes inferolaterally; QTc 466   I personally reviewed his last ECG of January 08, 2020 which showed sinus rhythm at 75 bpm, left bundle branch block, QRS duration 152 ms.  No ectopy.  Recent Labs: BMP Latest Ref Rng & Units 11/14/2020 07/22/2020 12/17/2019  Glucose 65 - 99 mg/dL 85 82 92  BUN 6 - 24 mg/dL '16 15 16  ' Creatinine 0.76 - 1.27 mg/dL 0.94 0.96 0.99  BUN/Creat Ratio 9 - '20 17 16 16  ' Sodium 134 - 144 mmol/L 141 138 140  Potassium 3.5 - 5.2 mmol/L 4.3 4.5 4.6  Chloride 96 - 106 mmol/L 106 103 104  CO2 20 - 29 mmol/L 17(L) 19(L) 22  Calcium 8.7 - 10.2 mg/dL 9.3 9.1 9.5     Hepatic Function Latest Ref Rng & Units 11/14/2020 06/06/2014 03/02/2007  Total Protein 6.0 - 8.5 g/dL 7.2 8.0 6.5  Albumin 3.8 - 4.9 g/dL 4.2 3.8 3.5  AST 0 - 40 IU/L '17 22 24  ' ALT 0 - 44 IU/L '21 24 28  ' Alk Phosphatase 44 - 121 IU/L 100 84 68  Total Bilirubin 0.0 - 1.2 mg/dL 0.4 0.5 0.7    CBC Latest Ref Rng & Units 07/22/2020 10/08/2019 06/06/2014  WBC 3.4 - 10.8 x10E3/uL 8.1 6.7 8.7  Hemoglobin 13.0 - 17.7 g/dL 14.6  13.8 14.6  Hematocrit 37.5 - 51.0 % 43.9 42.0 43.8  Platelets 150 - 450 x10E3/uL 192 167 211   Lab Results  Component Value Date   MCV 94 07/22/2020   MCV 93 10/08/2019   MCV 89.0 06/06/2014   Lab Results  Component Value Date   TSH 2.780 11/14/2020   No results found for: HGBA1C   BNP    Component Value Date/Time   BNP 97.8 12/23/2017 1535    ProBNP    Component Value Date/Time   PROBNP <30.0 03/07/2007 0330     Lipid Panel     Component Value Date/Time   CHOL  03/02/2007 0838    170        ATP III CLASSIFICATION:  <200     mg/dL   Desirable  200-239  mg/dL   Borderline High  >=240    mg/dL   High   TRIG 290 (H) 03/02/2007 0838   HDL 26 (L) 03/02/2007 0838   CHOLHDL 6.5 03/02/2007 0838   VLDL 58 (H) 03/02/2007 0838   LDLCALC  03/02/2007 0838    86        Total Cholesterol/HDL:CHD Risk Coronary Heart Disease  Risk Table                     Men   Women  1/2 Average Risk   3.4   3.3     RADIOLOGY: No results found.    Additional studies/ records that were reviewed today include:  I reviewed Cody records of Dr. Gwenette Greet in 2015 as well as Drs Percival Carpenter, and Dr. Quentin Ore  ASSESSMENT:    1. Severe OSA (obstructive sleep apnea)   2. Daytime somnolence   3. Chronic systolic HF (heart failure) ([place])   4. Dilated cardiomyopathy ([place])   5. ICD (implantable cardioverter-defibrillator) in place     PLAN:  Cody Carpenter is a 55 year old gentleman who has a history of nonischemic cardiomyopathy documented since 2008.  Hisv echo on January 03, 2020 showed an EF of 20 to 25% with restrictive physiology. He  is on guideline directed medical therapy.  He has left bundle branch block and following approval of Medicaid, he received a BiV ICD device on July 28, 2020 by Dr. Quentin Ore.  He ha a history of severe sleep apnea originally diagnosed in 2008 and has an old Interior and spatial designer from 2006.  When I saw him in December 2021 he could not afford supplies and I  provided him with a new ResMed air fit F 30 medium mask as well as new CPAP tubing.  When I saw him in March 2022 he was exceedingly sleepy and had an Epworth Sleepiness Scale score of 24.  He ultimately underwent repeat sleep evaluation and had a split-night study on January 04, 2021.  This reconfirmed severe sleep apnea and he required fairly maximal titration of CPAP therapy and initially was recommended to sleep with a pressure range of 17 to 20 cm of water.  It was discussed at that time that future BiPAP may be necessary depending upon his subsequent downloads.  He has noticed significant improvement in his sleep since initiating CPAP PAP therapy.  On his most recent download he is compliant with 100% usage but usage is only approximately 5 hours and 30 minutes.  I discussed with him optimal sleep duration for an adult at 7 to 9 hours.  He states he can never sleep after 4 AM.  I discussed with him rather than getting up and taking a shower it may be worthwhile after he gets up to go to Cody bathroom at that time to put his mask back on and try to get back to sleep which should be improved with use of CPAP during this time.  I again discussed with him that typically Cody preponderance of REM sleep occurs in Cody second half of Cody night and if he is getting up consistently this early he may be depriving himself of opportunity for good deep rem sleep.  An Epworth score score was recalculated today and this endorsed at 12 and I suspect this may be contributed by his inadequate sleep duration.  Since he is at your maximal CPAP pressure, I will change his CPAP settings initial pressure to 19 with maximal pressure of 20.  If AHI continues to be elevated he may need transition to BiPAP therapy which allows titration up to 25 cm of water.  I am also increasing his ramp start pressure from 7 to 10 cm.  We will obtain a download in 1 month.  As long as he is stable, from a sleep perspective I will see him in 1 year or  sooner as needed.  Medication Adjustments/Labs and Tests Ordered: Current medicines are reviewed at length with Cody Carpenter today.  Concerns regarding medicines are outlined above.  Medication changes, Labs and Tests ordered today are listed in Cody Carpenter Instructions below. Carpenter Instructions  Medication Instructions:  Cody current medical regimen is effective;  continue present plan and medications.  *If you need a refill on your cardiac medications before your next appointment, please call your pharmacy*   Follow-Up: At [facility], you and your health needs are our priority.  As part of our continuing mission to provide you with exceptional heart care, we have created designated Provider Care Teams.  These Care Teams include your primary Cardiologist (physician) and Advanced Practice Providers (APPs -  Physician Assistants and Nurse Practitioners) who all work together to provide you with Cody care you need, when you need it.  We recommend signing up for Cody Carpenter portal called "MyChart".  Sign up information is provided on this After Visit Summary.  MyChart is used to connect with patients for Virtual Visits (Telemedicine).  Patients are able to view lab/test results, encounter notes, upcoming appointments, etc.  Non-urgent messages can be sent to your provider as well.   To learn more about what you can do with MyChart, go to [email or web address].    Your next appointment:   12 month(s)  Cody format for your next appointment:   In Person  Provider:   Shelva Majestic, MD (sleep)     Signed, Cody Majestic, MD  03/21/2021 9:53 AM    [facility] [address], [place], [place], [place]  [address] Phone: [phone]

## 2021-03-19 NOTE — Patient Instructions (Signed)

## 2021-03-21 ENCOUNTER — Encounter: Payer: Self-pay | Admitting: Cardiovascular Disease

## 2021-04-12 NOTE — Progress Notes (Deleted)
Cardiology Office Note   Date:  04/12/2021   ID:  Cody Carpenter, DOB Dec 12, 1965, MRN [id number]  PCP:  Patient, No Pcp Per (Inactive)  Cardiologist:   Rollene Rotunda, MD   No chief complaint on file.      History of Present Illness: Cody Carpenter is a 55 y.o. 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  His EF had been 20% in 2008 but improved with medical management. Unfortunately he eventually stopped his meds and his EF went from a high of 55% to 25% in 2015 and after med titration only went up to 30% in 2016.  However, in August his EF was 20 - 25%.   I have had a difficult time titrating his meds because of hypotension.  I did send him for a CPX recently.  He had a submax effort.  Peak VO2 was 17.5.     He is now status post BiV ICD.  ***   ***  He has had significant social stress.  He is under financial stress.  He is waiting on disability decision.  His predominant complaint has been profound fatigue.  He says he is tired after walking 25 yards.  He is not describing PND or orthopnea.  Not describing palpitations, presyncope or syncope.  He still has not had rescheduling above sleep study to see if his CPAP needs to be changed.   Past Medical History:  Diagnosis Date   CHF (congestive heart failure) ([facility])    Hypertension    Sleep apnea    CPAP    Past Surgical History:  Procedure Laterality Date   BIV ICD INSERTION CRT-D N/A 07/28/2020   Procedure: BIV ICD INSERTION CRT-D;  Surgeon: Lanier Prude, MD;  Location: [facility];  Service: Cardiovascular;  Laterality: N/A;   NO PAST SURGERIES       Current Outpatient Medications  Medication Sig Dispense Refill   amiodarone (PACERONE) 200 MG tablet Take 2 tablets (400 mg total) by mouth 2 (two) times daily for 7 days, THEN 1 tablet (200 mg total) daily. (Patient not taking: Reported on 03/19/2021) 111 tablet 3   amoxicillin (AMOXIL) 500 MG capsule Take 1 capsule  by mouth every 8 hours until finished 21 capsule 0   carvedilol (COREG) 25 MG tablet Take 1 tablet (25 mg total) by mouth 2 (two) times daily with a meal. 180 tablet 3   HYDROcodone-acetaminophen (NORCO/VICODIN) 5-325 MG tablet Take 1 tablet by mouth every 6 hours as needed for pain 6 tablet 0   ibuprofen (ADVIL) 800 MG tablet Take 1 tablet by mouth every 8 hours as needed for pain 15 tablet 0   sacubitril-valsartan (ENTRESTO) 49-51 MG Take 1 tablet by mouth 2 (two) times daily. 180 tablet 2   spironolactone (ALDACTONE) 25 MG tablet Take 1 tablet (25 mg total) by mouth at bedtime. 90 tablet 2   No current facility-administered medications for this visit.    Allergies:   Patient has no known allergies.    ROS:  Please see the history of present illness.   Otherwise, review of systems are positive for ***.   All other systems are reviewed and negative.    PHYSICAL EXAM: VS:  There were no vitals taken for this visit. , BMI There is no height or weight on file to calculate BMI. GENERAL:  Well appearing NECK:  No jugular venous distention, waveform within normal limits, carotid upstroke  brisk and symmetric, no bruits, no thyromegaly LUNGS:  Clear to auscultation bilaterally CHEST:  Unremarkable HEART:  PMI not displaced or sustained,S1 and S2 within normal limits, no S3, no S4, no clicks, no rubs, *** murmurs ABD:  Flat, positive bowel sounds normal in frequency in pitch, no bruits, no rebound, no guarding, no midline pulsatile mass, no hepatomegaly, no splenomegaly EXT:  2 plus pulses throughout, no edema, no cyanosis no clubbing    ***GENERAL:  Well appearing NECK:  No jugular venous distention, waveform within normal limits, carotid upstroke brisk and symmetric, no bruits, no thyromegaly LUNGS:  Clear to auscultation bilaterally CHEST:  Well healed ICD pocket.   HEART:  PMI not displaced or sustained,S1 and S2 within normal limits, no S3, no S4, no clicks, no rubs, no murmurs ABD:   Flat, positive bowel sounds normal in frequency in pitch, no bruits, no rebound, no guarding, no midline pulsatile mass, no hepatomegaly, no splenomegaly EXT:  2 plus pulses throughout, no edema, no cyanosis no clubbing    EKG:  EKG is *** ordered today. ***  Recent Labs: 07/22/2020: Hemoglobin 14.6; Platelets 192 11/14/2020: ALT 21; BUN 16; Creatinine, Ser 0.94; Potassium 4.3; Sodium 141; TSH 2.780    Lipid Panel    Component Value Date/Time   CHOL  03/02/2007 0838    170        ATP III CLASSIFICATION:  <200     mg/dL   Desirable  485-462  mg/dL   Borderline High  >=703    mg/dL   High   TRIG 500 (H) 93/81/8299 0838   HDL 26 (L) 03/02/2007 0838   CHOLHDL 6.5 03/02/2007 0838   VLDL 58 (H) 03/02/2007 0838   LDLCALC  03/02/2007 0838    86        Total Cholesterol/HDL:CHD Risk Coronary Heart Disease Risk Table                     Men   Women  1/2 Average Risk   3.4   3.3      Wt Readings from Last 3 Encounters:  03/19/21 243 lb 6.4 oz (110.4 kg)  01/04/21 230 lb (104.3 kg)  12/09/20 231 lb 9.6 oz (105.1 kg)      Other studies Reviewed: Additional studies/ records that were reviewed today include: *** Review of the above records demonstrates:  ***  ASSESSMENT AND PLAN:    CHRONIC SYSTOLIC HF: He has a nonischemic cardiomyopathy.  He had no coronary disease on cath previously.  ***  He has had a CPX in the past.  He was not thought to be a candidate for advanced therapies yet.  However, he continues to have increasing fatigue.  He does not have any evidence of volume overload but may have low output.  I discussed with him referral for advanced therapies.  He does not Consider this as he is under so much emotional stress he says he could not handle anything else.  His blood pressure will not allow med titration.   He would let me know if he has any worsening symptoms and in particular events volume overload.  I told him I would work with him in any way necessary to help with  his disability.  He has a IT trainer who is working on it.  PVCs:   He is now status post CRT D.   ***  He is pacing greater than 97% of the time.   HTN:  This is being  managed  ***in the context of treating his CHF  TOBACCO ABUSE:   ***  We talked multiple times about the need to stop smoking.  SLEEP APNEA:  ***    A sleep study has been ordered and is rescheduled.       Current medicines are reviewed at length with the patient today.  The patient does not have concerns regarding medicines.  The following changes have been made:   None  Labs/ tests ordered today include:  None  No orders of the defined types were placed in this encounter.    Disposition:   FU with me in 4 months.  months.     Signed, Rollene Rotunda, MD  04/12/2021 8:29 PM    [facility]

## 2021-04-14 ENCOUNTER — Ambulatory Visit: Payer: Medicaid Other | Admitting: Cardiology

## 2021-04-14 DIAGNOSIS — I10 Essential (primary) hypertension: Secondary | ICD-10-CM

## 2021-04-14 DIAGNOSIS — I5022 Chronic systolic (congestive) heart failure: Secondary | ICD-10-CM

## 2021-04-14 DIAGNOSIS — G4730 Sleep apnea, unspecified: Secondary | ICD-10-CM

## 2021-04-15 ENCOUNTER — Other Ambulatory Visit (HOSPITAL_COMMUNITY): Payer: Self-pay

## 2021-04-15 ENCOUNTER — Other Ambulatory Visit: Payer: Self-pay | Admitting: Cardiology

## 2021-04-15 MED ORDER — CARVEDILOL 25 MG PO TABS
ORAL_TABLET | Freq: Two times a day (BID) | ORAL | 3 refills | Status: DC
  Filled 2021-04-15: qty 180, 90d supply, fill #0
  Filled 2021-07-29: qty 180, 90d supply, fill #1
  Filled 2021-11-02: qty 180, 90d supply, fill #2
  Filled 2022-02-10: qty 180, 90d supply, fill #3

## 2021-04-16 ENCOUNTER — Other Ambulatory Visit (HOSPITAL_COMMUNITY): Payer: Self-pay

## 2021-04-28 ENCOUNTER — Ambulatory Visit (INDEPENDENT_AMBULATORY_CARE_PROVIDER_SITE_OTHER): Payer: Medicaid Other

## 2021-04-28 DIAGNOSIS — I447 Left bundle-branch block, unspecified: Secondary | ICD-10-CM | POA: Diagnosis not present

## 2021-04-28 LAB — CUP PACEART REMOTE DEVICE CHECK
Battery Remaining Longevity: 77 mo
Battery Remaining Percentage: 86 %
Battery Voltage: 2.99 V
Brady Statistic AP VP Percent: 62 %
Brady Statistic AP VS Percent: 1 %
Brady Statistic AS VP Percent: 26 %
Brady Statistic AS VS Percent: 1 %
Brady Statistic RA Percent Paced: 49 %
Date Time Interrogation Session: 20221227010734
HighPow Impedance: 79 Ohm
Implantable Lead Implant Date: 20220328
Implantable Lead Implant Date: 20220328
Implantable Lead Implant Date: 20220328
Implantable Lead Location: 753858
Implantable Lead Location: 753859
Implantable Lead Location: 753860
Implantable Pulse Generator Implant Date: 20220328
Lead Channel Impedance Value: 1250 Ohm
Lead Channel Impedance Value: 410 Ohm
Lead Channel Impedance Value: 450 Ohm
Lead Channel Pacing Threshold Amplitude: 0.75 V
Lead Channel Pacing Threshold Amplitude: 0.75 V
Lead Channel Pacing Threshold Amplitude: 1 V
Lead Channel Pacing Threshold Pulse Width: 0.5 ms
Lead Channel Pacing Threshold Pulse Width: 0.5 ms
Lead Channel Pacing Threshold Pulse Width: 0.5 ms
Lead Channel Sensing Intrinsic Amplitude: 1.8 mV
Lead Channel Sensing Intrinsic Amplitude: 11.9 mV
Lead Channel Setting Pacing Amplitude: 2 V
Lead Channel Setting Pacing Amplitude: 2 V
Lead Channel Setting Pacing Amplitude: 2.5 V
Lead Channel Setting Pacing Pulse Width: 0.5 ms
Lead Channel Setting Pacing Pulse Width: 0.5 ms
Lead Channel Setting Sensing Sensitivity: 0.5 mV
Pulse Gen Serial Number: 810019794

## 2021-04-29 ENCOUNTER — Other Ambulatory Visit (HOSPITAL_COMMUNITY): Payer: Self-pay

## 2021-05-05 ENCOUNTER — Telehealth: Payer: Self-pay

## 2021-05-05 ENCOUNTER — Other Ambulatory Visit (HOSPITAL_COMMUNITY): Payer: Self-pay

## 2021-05-05 MED ORDER — MEXILETINE HCL 150 MG PO CAPS
150.0000 mg | ORAL_CAPSULE | Freq: Two times a day (BID) | ORAL | 3 refills | Status: DC
  Filled 2021-05-05: qty 180, 90d supply, fill #0
  Filled 2021-08-18: qty 180, 90d supply, fill #1
  Filled 2021-11-18: qty 180, 90d supply, fill #2
  Filled 2022-03-09: qty 180, 90d supply, fill #3

## 2021-05-05 NOTE — Telephone Encounter (Signed)
Outreach made to Pt.  Advised that based on last remote monitor report, increased PVC's.  Per Dr. Wiliam Ke mexiletine 150 mg PO BID.  Advised Pt would continue to monitor remotely.  Pt declined office visit.  Requested medicine be sent to his pharmacy

## 2021-05-05 NOTE — Telephone Encounter (Signed)
Cody Prude, MD sent to Wiliam Ke, RN Cc: Sampson Goon, RN Device interrogation reviewed. Lead parameters stable. BiV pacing 88%. Plan to add mexiletine to suppress PVC which are causing poor pacing percentage. Continue remote monitoring.   Sheria Lang T. Lalla Brothers, MD, [facility], [facility]  Cardiac Electrophysiology

## 2021-05-06 ENCOUNTER — Other Ambulatory Visit (HOSPITAL_COMMUNITY): Payer: Self-pay

## 2021-05-07 ENCOUNTER — Other Ambulatory Visit (HOSPITAL_COMMUNITY): Payer: Self-pay

## 2021-05-08 NOTE — Progress Notes (Signed)
Remote ICD transmission.   

## 2021-06-07 DIAGNOSIS — I493 Ventricular premature depolarization: Secondary | ICD-10-CM | POA: Insufficient documentation

## 2021-06-07 NOTE — Progress Notes (Deleted)
Cardiology Office Note   Date:  06/07/2021   ID:  ANDRUS MCLAIN, DOB [birth year], MRN [id number]  PCP:  Patient, No Pcp Per (Inactive)  Cardiologist:   Rollene Rotunda, MD   No chief complaint on file.      History of Present Illness: Cody Carpenter is a 56 y.o. 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  His EF had been 20% in 2008 but improved with medical management. Unfortunately he eventually stopped his meds and his EF went from a high of 55% to 25% in 2015 and after med titration only went up to 30% in 2016.  However, in August his EF was 20 - 25%.   I have had a difficult time titrating his meds because of hypotension.  I did send him for a CPX recently.  He had a submax effort.  Peak VO2 was 17.5.     He is now status post BiV ICD.   A recent device interrogation demonstrated increased PVCs and he was started on mexiletine.    ***   *** He has had significant social stress.  He is under financial stress.  He is waiting on disability decision.  His predominant complaint has been profound fatigue.  He says he is tired after walking 25 yards.  He is not describing PND or orthopnea.  Not describing palpitations, presyncope or syncope.  He still has not had rescheduling above sleep study to see if his CPAP needs to be changed.   Past Medical History:  Diagnosis Date   CHF (congestive heart failure) (HCC)    Hypertension    Sleep apnea    CPAP    Past Surgical History:  Procedure Laterality Date   BIV ICD INSERTION CRT-D N/A 07/28/2020   Procedure: BIV ICD INSERTION CRT-D;  Surgeon: Lanier Prude, MD;  Location: [facility] INVASIVE CV LAB;  Service: Cardiovascular;  Laterality: N/A;   NO PAST SURGERIES       Current Outpatient Medications  Medication Sig Dispense Refill   amiodarone (PACERONE) 200 MG tablet Take 2 tablets (400 mg total) by mouth 2 (two) times daily for 7 days, THEN 1 tablet (200 mg total) daily. (Patient not  taking: Reported on 03/19/2021) 111 tablet 3   amoxicillin (AMOXIL) 500 MG capsule Take 1 capsule by mouth every 8 hours until finished 21 capsule 0   carvedilol (COREG) 25 MG tablet Take 1 tablet (25 mg total) by mouth 2 (two) times daily with a meal. 180 tablet 3   carvedilol (COREG) 25 MG tablet TAKE 1 TABLET BY MOUTH TWICE DAILY WITH A MEAL 180 tablet 3   HYDROcodone-acetaminophen (NORCO/VICODIN) 5-325 MG tablet Take 1 tablet by mouth every 6 hours as needed for pain 6 tablet 0   ibuprofen (ADVIL) 800 MG tablet Take 1 tablet by mouth every 8 hours as needed for pain 15 tablet 0   mexiletine (MEXITIL) 150 MG capsule Take 1 capsule (150 mg total) by mouth 2 (two) times daily. 180 capsule 3   sacubitril-valsartan (ENTRESTO) 49-51 MG Take 1 tablet by mouth 2 (two) times daily. 180 tablet 2   spironolactone (ALDACTONE) 25 MG tablet Take 1 tablet (25 mg total) by mouth at bedtime. 90 tablet 2   No current facility-administered medications for this visit.    Allergies:   Patient has no known allergies.    ROS:  Please see the history of present illness.   Otherwise, review of  systems are positive for ***.   All other systems are reviewed and negative.    PHYSICAL EXAM: VS:  There were no vitals taken for this visit. , BMI There is no height or weight on file to calculate BMI. GENERAL:  Well appearing NECK:  No jugular venous distention, waveform within normal limits, carotid upstroke brisk and symmetric, no bruits, no thyromegaly LUNGS:  Clear to auscultation bilaterally CHEST:  Unremarkable HEART:  PMI not displaced or sustained,S1 and S2 within normal limits, no S3, no S4, no clicks, no rubs, *** murmurs ABD:  Flat, positive bowel sounds normal in frequency in pitch, no bruits, no rebound, no guarding, no midline pulsatile mass, no hepatomegaly, no splenomegaly EXT:  2 plus pulses throughout, no edema, no cyanosis no clubbing    ***GENERAL:  Well appearing NECK:  No jugular venous  distention, waveform within normal limits, carotid upstroke brisk and symmetric, no bruits, no thyromegaly LUNGS:  Clear to auscultation bilaterally CHEST:  Well healed ICD pocket.   HEART:  PMI not displaced or sustained,S1 and S2 within normal limits, no S3, no S4, no clicks, no rubs, no murmurs ABD:  Flat, positive bowel sounds normal in frequency in pitch, no bruits, no rebound, no guarding, no midline pulsatile mass, no hepatomegaly, no splenomegaly EXT:  2 plus pulses throughout, no edema, no cyanosis no clubbing    EKG:  EKG is *** ordered today. ***  Recent Labs: 07/22/2020: Hemoglobin 14.6; Platelets 192 11/14/2020: ALT 21; BUN 16; Creatinine, Ser 0.94; Potassium 4.3; Sodium 141; TSH 2.780    Lipid Panel    Component Value Date/Time   CHOL  03/02/2007 0838    170        ATP III CLASSIFICATION:  <200     mg/dL   Desirable  200-239  mg/dL   Borderline High  >=240    mg/dL   High   TRIG 290 (H) 03/02/2007 0838   HDL 26 (L) 03/02/2007 0838   CHOLHDL 6.5 03/02/2007 0838   VLDL 58 (H) 03/02/2007 0838   LDLCALC  03/02/2007 0838    86        Total Cholesterol/HDL:CHD Risk Coronary Heart Disease Risk Table                     Men   Women  1/2 Average Risk   3.4   3.3      Wt Readings from Last 3 Encounters:  03/19/21 243 lb 6.4 oz (110.4 kg)  01/04/21 230 lb (104.3 kg)  12/09/20 231 lb 9.6 oz (105.1 kg)      Other studies Reviewed: Additional studies/ records that were reviewed today include: *** Review of the above records demonstrates:   ***  ASSESSMENT AND PLAN:    CHRONIC SYSTOLIC HF:  ***  He has a nonischemic cardiomyopathy.  He had no coronary disease on cath previously.  He has had a CPX in the past.  He was not thought to be a candidate for advanced therapies yet.  However, he continues to have increasing fatigue.  He does not have any evidence of volume overload but may have low output.  I discussed with him referral for advanced therapies.  He does  not Consider this as he is under so much emotional stress he says he could not handle anything else.  His blood pressure will not allow med titration.   He would let me know if he has any worsening symptoms and in particular events volume  overload.  I told him I would work with him in any way necessary to help with his disability.  He has a Product/process development scientist who is working on it.  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  *** .   He is pacing greater than 97% of the time.   HTN:    This is being managed in the context of treating his CHF ***  This is being managed in the context of treating his CHF  TOBACCO ABUSE:   *** We talked multiple times about the need to stop smoking.  SLEEP APNEA:     ***  A sleep study has been ordered and is rescheduled.       Current medicines are reviewed at length with the patient today.  The patient does not have concerns regarding medicines.  The following changes have been made:   None  Labs/ tests ordered today include:  None  No orders of the defined types were placed in this encounter.     Disposition:   FU with me in 4 months.  months.     Signed, Minus Breeding, MD  06/07/2021 11:13 AM    [facility]

## 2021-06-08 ENCOUNTER — Ambulatory Visit: Payer: Medicaid Other | Admitting: Cardiology

## 2021-06-08 DIAGNOSIS — I5022 Chronic systolic (congestive) heart failure: Secondary | ICD-10-CM

## 2021-06-08 DIAGNOSIS — Z720 Tobacco use: Secondary | ICD-10-CM

## 2021-06-08 DIAGNOSIS — I493 Ventricular premature depolarization: Secondary | ICD-10-CM

## 2021-06-08 DIAGNOSIS — G4730 Sleep apnea, unspecified: Secondary | ICD-10-CM

## 2021-06-08 DIAGNOSIS — I10 Essential (primary) hypertension: Secondary | ICD-10-CM

## 2021-07-01 ENCOUNTER — Other Ambulatory Visit: Payer: Self-pay | Admitting: Cardiology

## 2021-07-01 ENCOUNTER — Other Ambulatory Visit (HOSPITAL_COMMUNITY): Payer: Self-pay

## 2021-07-01 MED ORDER — SPIRONOLACTONE 25 MG PO TABS
25.0000 mg | ORAL_TABLET | Freq: Every day | ORAL | 0 refills | Status: DC
  Filled 2021-07-01: qty 90, 90d supply, fill #0

## 2021-07-08 DIAGNOSIS — Z95810 Presence of automatic (implantable) cardiac defibrillator: Secondary | ICD-10-CM | POA: Insufficient documentation

## 2021-07-08 NOTE — Progress Notes (Signed)
?  ?Cardiology Office Note ? ? ?Date:  07/10/2021  ? ?ID:  Cody Carpenter, DOB 08-26-1965, MRN [id number] ? ?PCP:  Patient, No Pcp Per (Inactive)  ?Cardiologist:   Minus Breeding, MD ? ? ?Chief Complaint  ?Patient presents with  ? Cardiomyopathy  ? ? ? ?  ?History of Present Illness: ?Cody Carpenter is a 56 y.o. 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  His EF had been 20% in 2008 but improved with medical management. Unfortunately he eventually stopped his meds and his EF went from a high of 55% to 25% in 2015 and after med titration only went up to 30% in 2016.  However, in August his EF was 20 - 25%.   I have had a difficult time titrating his meds because of hypotension.  I did send him for a CPX recently.  He had a submax effort.  Peak VO2 was 17.5.     He is now status post BiV ICD.  ? ?He had increased PVCs recently and was started on mexileitine.  He ws also found to have severe sleep apnea.  He has done well.  He finally got his disability.  He is able to take his medicines.  Does have some palpitations but not nearly as strong on the mexiletine.  Amiodarone was discontinued.  He is not having any new shortness of breath, PND or orthopnea.  He is sleeping much better because he is using CPAP.  He has less stress because of the finances.  He is not having any new chest pressure, neck or arm discomfort.  He has had no new weight gain or edema. ? ? ?Past Medical History:  ?Diagnosis Date  ? CHF (congestive heart failure) ([place])   ? Hypertension   ? Sleep apnea   ? CPAP  ? ? ?Past Surgical History:  ?Procedure Laterality Date  ? BIV ICD INSERTION CRT-D N/A 07/28/2020  ? Procedure: BIV ICD INSERTION CRT-D;  Surgeon: Vickie Epley, MD;  Location: [place] CV LAB;  Service: Cardiovascular;  Laterality: N/A;  ? NO PAST SURGERIES    ? ? ? ?Current Outpatient Medications  ?Medication Sig Dispense Refill  ? amoxicillin (AMOXIL) 500 MG capsule Take 1 capsule by mouth  every 8 hours until finished 21 capsule 0  ? carvedilol (COREG) 25 MG tablet TAKE 1 TABLET BY MOUTH TWICE DAILY WITH A MEAL 180 tablet 3  ? HYDROcodone-acetaminophen (NORCO/VICODIN) 5-325 MG tablet Take 1 tablet by mouth every 6 hours as needed for pain 6 tablet 0  ? ibuprofen (ADVIL) 800 MG tablet Take 1 tablet by mouth every 8 hours as needed for pain 15 tablet 0  ? mexiletine (MEXITIL) 150 MG capsule Take 1 capsule (150 mg total) by mouth 2 (two) times daily. 180 capsule 3  ? sacubitril-valsartan (ENTRESTO) 49-51 MG Take 1 tablet by mouth 2 (two) times daily. 180 tablet 2  ? spironolactone (ALDACTONE) 25 MG tablet Take 1 tablet (25 mg total) by mouth at bedtime. 90 tablet 0  ? ?No current facility-administered medications for this visit.  ? ? ?Allergies:   Patient has no known allergies.  ? ? ?ROS:  Please see the history of present illness.   Otherwise, review of systems are positive for none.   All other systems are reviewed and negative.  ? ? ?PHYSICAL EXAM: ?VS:  BP 140/78   Pulse 70   Ht 5\' 7"  (1.702 m)   Wt  248 lb (112.5 kg)   SpO2 98%   BMI 38.84 kg/m?  , BMI Body mass index is 38.84 kg/m?. ?GENERAL:  Well appearing ?NECK:  No jugular venous distention, waveform within normal limits, carotid upstroke brisk and symmetric, no bruits, no thyromegaly ?LUNGS:  Clear to auscultation bilaterally ?CHEST:  Well healed ICD pocket.   ?HEART:  PMI not displaced or sustained,S1 and S2 within normal limits, no S3, no S4, no clicks, no rubs, no murmurs ?ABD:  Flat, positive bowel sounds normal in frequency in pitch, no bruits, no rebound, no guarding, no midline pulsatile mass, no hepatomegaly, no splenomegaly ?EXT:  2 plus pulses throughout, no edema, no cyanosis no clubbing ? ? ?EKG:  EKG is not ordered today. ?NA ? ?Recent Labs: ?07/22/2020: Hemoglobin 14.6; Platelets 192 ?11/14/2020: ALT 21; BUN 16; Creatinine, Ser 0.94; Potassium 4.3; Sodium 141; TSH 2.780  ? ? ?Lipid Panel ?   ?Component Value Date/Time  ? CHOL   03/02/2007 0838  ?  170        ?ATP III CLASSIFICATION: ? <200     mg/dL   Desirable ? 200-239  mg/dL   Borderline High ? >=240    mg/dL   High  ? TRIG 290 (H) 03/02/2007 [id number]  ? HDL 26 (L) 03/02/2007 [id number]  ? CHOLHDL 6.5 03/02/2007 0838  ? VLDL 58 (H) 03/02/2007 [id number]  ? [place]  03/02/2007 0838  ?  86        ?Total Cholesterol/HDL:CHD Risk ?Coronary Heart Disease Risk Table ?                    Men   Women ? 1/2 Average Risk   3.4   3.3  ? ?  ? ?Wt Readings from Last 3 Encounters:  ?07/10/21 248 lb (112.5 kg)  ?03/19/21 243 lb 6.4 oz (110.4 kg)  ?01/04/21 230 lb (104.3 kg)  ?  ? ? ?Other studies Reviewed: ?Additional studies/ records that were reviewed today include: None ?Review of the above records demonstrates: See elsewhere ? ?ASSESSMENT AND PLAN: ? ?  ?CHRONIC SYSTOLIC HF:     He seems to be euvolemic.  Today I am going to increase his Entresto.  I like to have him come back in about 3 months to consider [place].  I will have him come back in a couple of weeks for basic metabolic profile. ? ?PVCs:   He remains on mexiletine.  He is now status post CRT D.    ? ?HTN:     This is being managed in the context of treating his CHF ? ?TOBACCO ABUSE: He cannot quit smoking and we talked about this every time. ? ?SLEEP APNEA:    He feels much better with his mask.  No change in therapy. ? ? ?Current medicines are reviewed at length with the patient today.  The patient does not have concerns regarding medicines. ? ?The following changes have been made:   As above ? ?Labs/ tests ordered today include:    ? ?No orders of the defined types were placed in this encounter. ? ? ? ? ?Disposition:   FU with APP in 3 months.   ? ? ?Signed, ?Minus Breeding, MD  ?07/10/2021 8:02 AM    ?[place] ?

## 2021-07-10 ENCOUNTER — Other Ambulatory Visit (HOSPITAL_COMMUNITY): Payer: Self-pay

## 2021-07-10 ENCOUNTER — Ambulatory Visit (INDEPENDENT_AMBULATORY_CARE_PROVIDER_SITE_OTHER): Payer: Medicaid Other | Admitting: Cardiology

## 2021-07-10 ENCOUNTER — Other Ambulatory Visit: Payer: Self-pay

## 2021-07-10 ENCOUNTER — Encounter: Payer: Self-pay | Admitting: Cardiology

## 2021-07-10 VITALS — BP 140/78 | HR 70 | Ht 67.0 in | Wt 248.0 lb

## 2021-07-10 DIAGNOSIS — I10 Essential (primary) hypertension: Secondary | ICD-10-CM

## 2021-07-10 DIAGNOSIS — I5022 Chronic systolic (congestive) heart failure: Secondary | ICD-10-CM

## 2021-07-10 DIAGNOSIS — I493 Ventricular premature depolarization: Secondary | ICD-10-CM

## 2021-07-10 DIAGNOSIS — Z95810 Presence of automatic (implantable) cardiac defibrillator: Secondary | ICD-10-CM

## 2021-07-10 MED ORDER — ENTRESTO 97-103 MG PO TABS
1.0000 | ORAL_TABLET | Freq: Two times a day (BID) | ORAL | 5 refills | Status: DC
  Filled 2021-07-10: qty 60, 30d supply, fill #0
  Filled 2021-08-27: qty 60, 30d supply, fill #1
  Filled 2021-09-25: qty 60, 30d supply, fill #2
  Filled 2021-11-02: qty 60, 30d supply, fill #3
  Filled 2021-12-04: qty 60, 30d supply, fill #4
  Filled 2022-01-06: qty 60, 30d supply, fill #5

## 2021-07-10 NOTE — Patient Instructions (Signed)
Medication Instructions:  ?INCREASE ENTRESTO TO 97-103 MG TWICE A DAY  ? ?*If you need a refill on your cardiac medications before your next appointment, please call your pharmacy* ? ?Lab Work: ?BMET IN 2 WEEKS ? ?If you have labs (blood work) drawn today and your tests are completely normal, you will receive your results only by: ?MyChart Message (if you have MyChart) OR ?A paper copy in the mail ?If you have any lab test that is abnormal or we need to change your treatment, we will call you to review the results. ? ?Testing/Procedures: ?NONE ? ?Follow-Up: ?At [facility], you and your health needs are our priority.  As part of our continuing mission to provide you with exceptional heart care, we have created designated Provider Care Teams.  These Care Teams include your primary Cardiologist (physician) and Advanced Practice Providers (APPs -  Physician Assistants and Nurse Practitioners) who all work together to provide you with the care you need, when you need it. ? ?We recommend signing up for the patient portal called "MyChart".  Sign up information is provided on this After Visit Summary.  MyChart is used to connect with patients for Virtual Visits (Telemedicine).  Patients are able to view lab/test results, encounter notes, upcoming appointments, etc.  Non-urgent messages can be sent to your provider as well.   ?To learn more about what you can do with MyChart, go to [email or web address].   ? ?Your next appointment:   ?3 month(s) ? ?The format for your next appointment:   ?In Person ? ?Provider:   ?NP OR PA  ? ?

## 2021-07-14 ENCOUNTER — Other Ambulatory Visit (HOSPITAL_COMMUNITY): Payer: Self-pay

## 2021-07-28 ENCOUNTER — Ambulatory Visit (INDEPENDENT_AMBULATORY_CARE_PROVIDER_SITE_OTHER): Payer: Medicaid Other

## 2021-07-28 DIAGNOSIS — I447 Left bundle-branch block, unspecified: Secondary | ICD-10-CM

## 2021-07-28 LAB — CUP PACEART REMOTE DEVICE CHECK
Battery Remaining Longevity: 76 mo
Battery Remaining Percentage: 83 %
Battery Voltage: 2.98 V
Brady Statistic AP VP Percent: 64 %
Brady Statistic AP VS Percent: 1 %
Brady Statistic AS VP Percent: 24 %
Brady Statistic AS VS Percent: 1 %
Brady Statistic RA Percent Paced: 50 %
Date Time Interrogation Session: 20230328030120
HighPow Impedance: 84 Ohm
Implantable Lead Implant Date: 20220328
Implantable Lead Implant Date: 20220328
Implantable Lead Implant Date: 20220328
Implantable Lead Location: 753858
Implantable Lead Location: 753859
Implantable Lead Location: 753860
Implantable Pulse Generator Implant Date: 20220328
Lead Channel Impedance Value: 1275 Ohm
Lead Channel Impedance Value: 410 Ohm
Lead Channel Impedance Value: 490 Ohm
Lead Channel Pacing Threshold Amplitude: 0.75 V
Lead Channel Pacing Threshold Amplitude: 0.75 V
Lead Channel Pacing Threshold Amplitude: 1 V
Lead Channel Pacing Threshold Pulse Width: 0.5 ms
Lead Channel Pacing Threshold Pulse Width: 0.5 ms
Lead Channel Pacing Threshold Pulse Width: 0.5 ms
Lead Channel Sensing Intrinsic Amplitude: 11.9 mV
Lead Channel Sensing Intrinsic Amplitude: 2.4 mV
Lead Channel Setting Pacing Amplitude: 2 V
Lead Channel Setting Pacing Amplitude: 2 V
Lead Channel Setting Pacing Amplitude: 2.5 V
Lead Channel Setting Pacing Pulse Width: 0.5 ms
Lead Channel Setting Pacing Pulse Width: 0.5 ms
Lead Channel Setting Sensing Sensitivity: 0.5 mV
Pulse Gen Serial Number: 810019794

## 2021-07-29 ENCOUNTER — Other Ambulatory Visit (HOSPITAL_COMMUNITY): Payer: Self-pay

## 2021-08-07 NOTE — Progress Notes (Signed)
Remote ICD transmission.   

## 2021-08-19 ENCOUNTER — Other Ambulatory Visit (HOSPITAL_COMMUNITY): Payer: Self-pay

## 2021-08-19 NOTE — Progress Notes (Signed)
? ? ?Electrophysiology Office Note ?Date: 08/26/2021 ? ?ID:  KEIJUAN IWANICKI, DOB 1965-12-18, MRN [id number] ? ?PCP: Patient, No Pcp Per (Inactive) ?Primary Cardiologist: Minus Breeding, MD ?Electrophysiologist: Vickie Epley, MD  ? ?CC: Routine ICD follow-up ? ?REDDING HAY is a 56 y.o. male seen today for Vickie Epley, MD for routine electrophysiology followup.  Since last being seen in our clinic the patient reports doing OK overall. He has palpitations several times a week, that are only rarely "unbearable" or interfere with his activity. He isn't sure if he is on amiodarone or not. he denies chest pain, palpitations, dyspnea, PND, orthopnea, nausea, vomiting, dizziness, syncope, edema, weight gain, or early satiety. He has not had ICD shocks.  ? ?Device History: ?St. Jude BiV ICD implanted 07/2020 for CHF ? ?Past Medical History:  ?Diagnosis Date  ? CHF (congestive heart failure) ([place])   ? Hypertension   ? Sleep apnea   ? CPAP  ? ?Past Surgical History:  ?Procedure Laterality Date  ? BIV ICD INSERTION CRT-D N/A 07/28/2020  ? Procedure: BIV ICD INSERTION CRT-D;  Surgeon: Vickie Epley, MD;  Location: [place] CV LAB;  Service: Cardiovascular;  Laterality: N/A;  ? NO PAST SURGERIES    ? ? ?Current Outpatient Medications  ?Medication Sig Dispense Refill  ? amoxicillin (AMOXIL) 500 MG capsule Take 1 capsule by mouth every 8 hours until finished 21 capsule 0  ? carvedilol (COREG) 25 MG tablet TAKE 1 TABLET BY MOUTH TWICE DAILY WITH A MEAL 180 tablet 3  ? HYDROcodone-acetaminophen (NORCO/VICODIN) 5-325 MG tablet Take 1 tablet by mouth every 6 hours as needed for pain 6 tablet 0  ? mexiletine (MEXITIL) 150 MG capsule Take 1 capsule (150 mg total) by mouth 2 (two) times daily. 180 capsule 3  ? sacubitril-valsartan (ENTRESTO) 97-103 MG Take 1 tablet by mouth 2 (two) times daily. 60 tablet 5  ? spironolactone (ALDACTONE) 25 MG tablet Take 1 tablet (25 mg total) by mouth at bedtime. 90 tablet 0  ? ?No  current facility-administered medications for this visit.  ? ? ?Allergies:   Patient has no known allergies.  ? ?Social History: ?Social History  ? ?Socioeconomic History  ? Marital status: Single  ?  Spouse name: Not on file  ? Number of children: 3  ? Years of education: Not on file  ? Highest education level: Not on file  ?Occupational History  ? Occupation: roofer  ?Tobacco Use  ? Smoking status: Every Day  ?  Packs/day: 1.00  ?  Years: 25.00  ?  Pack years: 25.00  ?  Types: Cigarettes  ? Smokeless tobacco: Never  ?Substance and Sexual Activity  ? Alcohol use: Yes  ? Drug use: No  ? Sexual activity: Not on file  ?Other Topics Concern  ? Not on file  ?Social History Narrative  ? Not on file  ? ?Social Determinants of Health  ? ?Financial Resource Strain: Not on file  ?Food Insecurity: Not on file  ?Transportation Needs: Not on file  ?Physical Activity: Not on file  ?Stress: Not on file  ?Social Connections: Not on file  ?Intimate Partner Violence: Not on file  ? ? ?Family History: ?Family History  ?Problem Relation Age of Onset  ? Heart disease Maternal Grandfather   ? Lung cancer Maternal Grandmother   ? ? ?Review of Systems: ?All other systems reviewed and are otherwise negative except as noted above. ? ? ?Physical Exam: ?Vitals:  ? 08/26/21 0827  ?BP:  100/62  ?Pulse: 70  ?SpO2: 95%  ?Weight: 241 lb (109.3 kg)  ?Height: 5\' 7"  (1.702 m)  ?  ? ?GEN- The patient is well appearing, alert and oriented x 3 today.   ?HEENT: normocephalic, atraumatic; sclera clear, conjunctiva pink; hearing intact; oropharynx clear; neck supple, no JVP ?Lymph- no cervical lymphadenopathy ?Lungs- Clear to ausculation bilaterally, normal work of breathing.  No wheezes, rales, rhonchi ?Heart- Regular rate and rhythm, no murmurs, rubs or gallops, PMI not laterally displaced ?GI- soft, non-tender, non-distended, bowel sounds present, no hepatosplenomegaly ?Extremities- no clubbing or cyanosis. No edema; DP/PT/radial pulses 2+  bilaterally ?MS- no significant deformity or atrophy ?Skin- warm and dry, no rash or lesion; ICD pocket well healed ?Psych- euthymic mood, full affect ?Neuro- strength and sensation are intact ? ?ICD interrogation- reviewed in detail today,  See PACEART report ? ?EKG:  EKG is ordered today. ?Personal review of EKG ordered today shows NSR at 70 bpm, QRS 116 ms ? ?Recent Labs: ?11/14/2020: ALT 21; BUN 16; Creatinine, Ser 0.94; Potassium 4.3; Sodium 141; TSH 2.780  ? ?Wt Readings from Last 3 Encounters:  ?08/26/21 241 lb (109.3 kg)  ?07/10/21 248 lb (112.5 kg)  ?03/19/21 243 lb 6.4 oz (110.4 kg)  ?  ? ?Other studies Reviewed: ?Additional studies/ records that were reviewed today include: Previous EP office notes.  ? ?Assessment and Plan: ? ?1.  Chronic systolic dysfunction s/p St. Jude CRT-D  ?euvolemic today ?Stable on an appropriate medical regimen ?Normal ICD function ?See Claudia Desanctis Art report ?No changes today ? ?2. PVCs/NSVT ?Continue mexiletine ? BIV pacing 88%.  ?He thinks he stopped amiodarone when he started mexiletine. He will confirm at home.  ?Could increase mexiletine or change to Toprol. Will discuss with Dr. Quentin Ore pending pt reporting amio use.  ? ?Current medicines are reviewed at length with the patient today.   ? ? ?Labs/ tests ordered today include:  ?Orders Placed This Encounter  ?Procedures  ? Comprehensive metabolic panel  ? CBC  ? EKG 12-Lead  ? ? ?Disposition:   Follow up with Dr. Quentin Ore in 6 months  ? ? ?Signed, ?Shirley Friar, PA-C  ?08/26/2021 ?8:52 AM ? ?[facility] ?[address] ?[address] ?[place] [address] ?[phone] (office) ?[phone] (fax)  ?

## 2021-08-26 ENCOUNTER — Encounter: Payer: Self-pay | Admitting: Student

## 2021-08-26 ENCOUNTER — Telehealth: Payer: Self-pay | Admitting: Student

## 2021-08-26 ENCOUNTER — Ambulatory Visit (INDEPENDENT_AMBULATORY_CARE_PROVIDER_SITE_OTHER): Payer: Medicaid Other | Admitting: Student

## 2021-08-26 VITALS — BP 100/62 | HR 70 | Ht 67.0 in | Wt 241.0 lb

## 2021-08-26 DIAGNOSIS — I5022 Chronic systolic (congestive) heart failure: Secondary | ICD-10-CM

## 2021-08-26 DIAGNOSIS — I4729 Other ventricular tachycardia: Secondary | ICD-10-CM

## 2021-08-26 DIAGNOSIS — I493 Ventricular premature depolarization: Secondary | ICD-10-CM | POA: Diagnosis not present

## 2021-08-26 LAB — CUP PACEART INCLINIC DEVICE CHECK
Battery Remaining Longevity: 75 mo
Brady Statistic RA Percent Paced: 49 %
Brady Statistic RV Percent Paced: 88 %
Date Time Interrogation Session: 20230426085025
HighPow Impedance: 86.625
Implantable Lead Implant Date: 20220328
Implantable Lead Implant Date: 20220328
Implantable Lead Implant Date: 20220328
Implantable Lead Location: 753858
Implantable Lead Location: 753859
Implantable Lead Location: 753860
Implantable Pulse Generator Implant Date: 20220328
Lead Channel Impedance Value: 1225 Ohm
Lead Channel Impedance Value: 412.5 Ohm
Lead Channel Impedance Value: 537.5 Ohm
Lead Channel Pacing Threshold Amplitude: 0.75 V
Lead Channel Pacing Threshold Amplitude: 0.75 V
Lead Channel Pacing Threshold Amplitude: 0.75 V
Lead Channel Pacing Threshold Amplitude: 0.75 V
Lead Channel Pacing Threshold Amplitude: 1 V
Lead Channel Pacing Threshold Amplitude: 1 V
Lead Channel Pacing Threshold Pulse Width: 0.5 ms
Lead Channel Pacing Threshold Pulse Width: 0.5 ms
Lead Channel Pacing Threshold Pulse Width: 0.5 ms
Lead Channel Pacing Threshold Pulse Width: 0.5 ms
Lead Channel Pacing Threshold Pulse Width: 0.5 ms
Lead Channel Pacing Threshold Pulse Width: 0.5 ms
Lead Channel Sensing Intrinsic Amplitude: 11.9 mV
Lead Channel Sensing Intrinsic Amplitude: 3 mV
Lead Channel Setting Pacing Amplitude: 2 V
Lead Channel Setting Pacing Amplitude: 2 V
Lead Channel Setting Pacing Amplitude: 2.5 V
Lead Channel Setting Pacing Pulse Width: 0.5 ms
Lead Channel Setting Pacing Pulse Width: 0.5 ms
Lead Channel Setting Sensing Sensitivity: 0.5 mV
Pulse Gen Serial Number: 810019794

## 2021-08-26 NOTE — Telephone Encounter (Signed)
Left message for patient to call back  

## 2021-08-26 NOTE — Telephone Encounter (Signed)
Pt c/o medication issue: ? ?1. Name of Medication: Amiodarone ? ?2. How are you currently taking this medication (dosage and times per day)?  ? ?3. Are you having a reaction (difficulty breathing--STAT)?  ? ?4. What is your medication issue? Have not been taking the Amiodarone for the last 3 months wanted Mardelle Matte to know this ? ?

## 2021-08-26 NOTE — Patient Instructions (Signed)
Medication Instructions:  ?Your physician recommends that you continue on your current medications as directed. Please refer to the Current Medication list given to you today. ? ?*If you need a refill on your cardiac medications before your next appointment, please call your pharmacy* ? ? ?Lab Work: ?TODAY: CMET, CBC ? ?If you have labs (blood work) drawn today and your tests are completely normal, you will receive your results only by: ?MyChart Message (if you have MyChart) OR ?A paper copy in the mail ?If you have any lab test that is abnormal or we need to change your treatment, we will call you to review the results. ? ? ?Follow-Up: ?At [facility], you and your health needs are our priority.  As part of our continuing mission to provide you with exceptional heart care, we have created designated Provider Care Teams.  These Care Teams include your primary Cardiologist (physician) and Advanced Practice Providers (APPs -  Physician Assistants and Nurse Practitioners) who all work together to provide you with the care you need, when you need it. ? ?Your next appointment:   ?6 month(s) ? ?The format for your next appointment:   ?In Person ? ?Provider:   ?Steffanie Dunn, MD  ? ? ?Other Instructions ?Check your medications for Amiodarone ?  ?

## 2021-08-27 LAB — COMPREHENSIVE METABOLIC PANEL
ALT: 20 IU/L (ref 0–44)
AST: 15 IU/L (ref 0–40)
Albumin/Globulin Ratio: 1.5 (ref 1.2–2.2)
Albumin: 4.2 g/dL (ref 3.8–4.9)
Alkaline Phosphatase: 96 IU/L (ref 44–121)
BUN/Creatinine Ratio: 15 (ref 9–20)
BUN: 15 mg/dL (ref 6–24)
Bilirubin Total: 0.4 mg/dL (ref 0.0–1.2)
CO2: 21 mmol/L (ref 20–29)
Calcium: 9.3 mg/dL (ref 8.7–10.2)
Chloride: 106 mmol/L (ref 96–106)
Creatinine, Ser: 1.01 mg/dL (ref 0.76–1.27)
Globulin, Total: 2.8 g/dL (ref 1.5–4.5)
Glucose: 95 mg/dL (ref 70–99)
Potassium: 5 mmol/L (ref 3.5–5.2)
Sodium: 142 mmol/L (ref 134–144)
Total Protein: 7 g/dL (ref 6.0–8.5)
eGFR: 88 mL/min/{1.73_m2} (ref >59)

## 2021-08-27 LAB — CBC
Hematocrit: 45.4 % (ref 37.5–51.0)
Hemoglobin: 14.7 g/dL (ref 13.0–17.7)
MCH: 29.5 pg (ref 26.6–33.0)
MCHC: 32.4 g/dL (ref 31.5–35.7)
MCV: 91 fL (ref 79–97)
Platelets: 213 10*3/uL (ref 150–450)
RBC: 4.98 x10E6/uL (ref 4.14–5.80)
RDW: 14.1 % (ref 11.6–15.4)
WBC: 7.7 10*3/uL (ref 3.4–10.8)

## 2021-08-28 ENCOUNTER — Other Ambulatory Visit (HOSPITAL_COMMUNITY): Payer: Self-pay

## 2021-08-31 ENCOUNTER — Other Ambulatory Visit (HOSPITAL_COMMUNITY): Payer: Self-pay

## 2021-08-31 MED ORDER — AMIODARONE HCL 200 MG PO TABS
ORAL_TABLET | ORAL | 3 refills | Status: DC
  Filled 2021-08-31: qty 90, 76d supply, fill #0
  Filled 2021-11-02: qty 90, 76d supply, fill #1
  Filled 2022-02-10: qty 90, 76d supply, fill #2
  Filled 2022-05-05: qty 90, 76d supply, fill #3

## 2021-08-31 NOTE — Telephone Encounter (Signed)
I spoke with pt, he will restart Amiodarone. He is aware that someone will be calling him to schedule an appt with Dr Quentin Ore.  ?

## 2021-08-31 NOTE — Telephone Encounter (Signed)
Left message to call back  

## 2021-08-31 NOTE — Telephone Encounter (Signed)
I do think he needs to be back on amiodarone. We really need to get his BiV pacing percentage back up.  ?I suspect he is heading towards advanced therapies.  ?Can you get this added back on? I would do Amio 200mg  PO BID x 14 days followed by 200mg  PO daily after that.  ? ? Get him into see me at my next available follow up appointment in [facility] or [place].  ? ?Thanks!  ? ?[facility]  ?

## 2021-09-21 ENCOUNTER — Other Ambulatory Visit (HOSPITAL_COMMUNITY): Payer: Self-pay

## 2021-09-25 ENCOUNTER — Other Ambulatory Visit: Payer: Self-pay | Admitting: Cardiology

## 2021-09-25 ENCOUNTER — Other Ambulatory Visit (HOSPITAL_COMMUNITY): Payer: Self-pay

## 2021-09-25 MED ORDER — SPIRONOLACTONE 25 MG PO TABS
25.0000 mg | ORAL_TABLET | Freq: Every day | ORAL | 0 refills | Status: DC
  Filled 2021-09-25: qty 90, 90d supply, fill #0

## 2021-09-29 ENCOUNTER — Other Ambulatory Visit (HOSPITAL_COMMUNITY): Payer: Self-pay

## 2021-09-30 ENCOUNTER — Other Ambulatory Visit: Payer: Self-pay

## 2021-10-01 NOTE — Progress Notes (Unsigned)
Office Visit    Patient Name: Cody Carpenter Date of Encounter: 10/02/2021  Primary Care Provider:  Patient, No Pcp Per (Inactive) Primary Cardiologist:  Minus Breeding, MD  Chief Complaint   56 year old male with a history of chronic systolic heart failure, NICM s/p ICD, PVCs/NSVT, hypertension, and OSA who presents for follow-up related to heart failure.   Past Medical History    Past Medical History:  Diagnosis Date   CHF (congestive heart failure) ([place])    Hypertension    Sleep apnea    CPAP   Past Surgical History:  Procedure Laterality Date   BIV ICD INSERTION CRT-D N/A 07/28/2020   Procedure: BIV ICD INSERTION CRT-D;  Surgeon: Vickie Epley, MD;  Location: [facility];  Service: Cardiovascular;  Laterality: N/A;   NO PAST SURGERIES      Allergies  No Known Allergies  History of Present Illness    56 year old male with the above past medical history including chronic systolic heart failure, NICM s/p ICD, PVCs/NSVT, hypertension, and OSA.  He has a history of reduced EF, as low as 20% in 2008, thought to be nonischemic cardiomyopathy.  His EF improved with medical therapy.  Unfortunately, he eventually stopped his medications and his EF went from a high 55% to 25% in 2015.  EF increased to 30% in 2016 following medication titration.  However, repeat echocardiogram in September 2021 showed EF 20 to 25%.  Cardiac MRI in July 2022 showed EF 26%, moderately dilated LV with normal wall thickness, global hypokinesis, septal lateral dyssynchrony consistent with LBBB, normal RV size with moderately decreased systolic function, EF 123456, LGE pattern not suggestive of CAD, most consistent with prior myocarditis.  He underwent ICD insertion in 07/2020.  He follows with EP.  He has a history of frequent PVCs, NSVT, and palpitations.  He last saw Dr. Percival Spanish in the office on 07/10/2021 and was stable overall from a cardiac standpoint. Entresto was increased.  Consideration of  [place] was recommended for future visit.  He saw EP 08/26/2021. Amiodarone was restarted in addition to continuation of mexiletine with recommended close EP follow-up.   He presents today for follow-up.  Since his last visit he has been stable from a cardiac standpoint.  He denies worsening dyspnea, denies edema, PND, orthopnea, weight gain, denies symptoms concerning for angina.  He is tolerating his increased dose of Entresto well.  Additionally, he reports a dramatic decrease in palpitations since restarting amiodarone.  He continues to smoke.  Otherwise, he reports feeling well and denies any new concerns today.  Home Medications    Current Outpatient Medications  Medication Sig Dispense Refill   amiodarone (PACERONE) 200 MG tablet Take 1 tablet by mouth twice daily for 14 days then take 1 tablet daily. 90 tablet 3   carvedilol (COREG) 25 MG tablet TAKE 1 TABLET BY MOUTH TWICE DAILY WITH A MEAL 180 tablet 3   HYDROcodone-acetaminophen (NORCO/VICODIN) 5-325 MG tablet Take 1 tablet by mouth every 6 hours as needed for pain 6 tablet 0   mexiletine (MEXITIL) 150 MG capsule Take 1 capsule (150 mg total) by mouth 2 (two) times daily. 180 capsule 3   sacubitril-valsartan (ENTRESTO) 97-103 MG Take 1 tablet by mouth 2 (two) times daily. 60 tablet 5   spironolactone (ALDACTONE) 25 MG tablet Take 1 tablet (25 mg total) by mouth at bedtime. 90 tablet 0   No current facility-administered medications for this visit.     Review of Systems  He denies chest pain, palpitations, pnd, orthopnea, n, v, dizziness, syncope, edema, weight gain, or early satiety. All other systems reviewed and are otherwise negative except as noted above.   Physical Exam    VS:  BP 112/62   Pulse 62   Ht 5\' 7"  (1.702 m)   Wt 242 lb 12.8 oz (110.1 kg)   SpO2 97%   BMI 38.03 kg/m   GEN: Well nourished, well developed, in no acute distress. HEENT: normal. Neck: Supple, no JVD, carotid bruits, or masses. Cardiac: RRR, no  murmurs, rubs, or gallops. No clubbing, cyanosis, edema.  Radials/DP/PT 2+ and equal bilaterally.  Respiratory:  Respirations regular and unlabored, clear to auscultation bilaterally. GI: Soft, nontender, nondistended, BS + x 4. MS: no deformity or atrophy. Skin: warm and dry, no rash. Neuro:  Strength and sensation are intact. Psych: Normal affect.  Accessory Clinical Findings    ECG personally reviewed by me today - No EKG in office today.  Lab Results  Component Value Date   WBC 7.7 08/26/2021   HGB 14.7 08/26/2021   HCT 45.4 08/26/2021   MCV 91 08/26/2021   PLT 213 08/26/2021   Lab Results  Component Value Date   CREATININE 1.01 08/26/2021   BUN 15 08/26/2021   NA 142 08/26/2021   K 5.0 08/26/2021   CL 106 08/26/2021   CO2 21 08/26/2021   Lab Results  Component Value Date   ALT 20 08/26/2021   AST 15 08/26/2021   ALKPHOS 96 08/26/2021   BILITOT 0.4 08/26/2021   Lab Results  Component Value Date   CHOL  03/02/2007    170        ATP III CLASSIFICATION:  <200     mg/dL   Desirable  200-239  mg/dL   Borderline High  >=240    mg/dL   High   HDL 26 (L) 03/02/2007   LDLCALC  03/02/2007    86        Total Cholesterol/HDL:CHD Risk Coronary Heart Disease Risk Table                     Men   Women  1/2 Average Risk   3.4   3.3   TRIG 290 (H) 03/02/2007   CHOLHDL 6.5 03/02/2007    No results found for: HGBA1C  Assessment & Plan    1. Chronic systolic heart failure/NICM: Prior EF 20%, improved to 55%, and then decreased again to 20%.  Cardiac MRI in July 2022 showed EF 26%, moderately dilated LV with normal wall thickness, global hypokinesis, septal lateral dyssynchrony consistent with LBBB, normal RV size with moderately decreased systolic function, EF 123456, LGE pattern not suggestive of CAD, most consistent with prior myocarditis. S/p ICD in 07/2020.  He does have stable chronic dyspnea in the setting of COPD, otherwise, euvolemic and well compensated on exam. He is  tolerating increased dose of Entresto. CMET was stable on 08/26/2021.  Discussed possible addition of Farxiga at today's visit, however, patient states he would like to wait at this time. Can address again at next OV.  No indication for loop diuretic at this time. Continue carvedilol, spironolactone, Entresto.   2. PVCs/NSVT: Following with EP. He reports a dramatic decrease in palpitations since restarting amiodarone.  He has follow-up scheduled with Dr. Quentin Ore in July 2023.  Continue amiodarone, carvedilol, mexiletine.  3. Hypertension: BP well controlled. Continue current antihypertensive regimen.   4. OSA: Adherent to CPAP.  Follows with Dr.  Claiborne Billings.  Denies any concerns today.  5. COPD/tobacco use: He continues to smoke.  He has stable chronic dyspnea.  Full cessation advised.  6. Disposition: Follow-up EP as scheduled, follow-up with general cardiology in 3 to 4 months.  Lenna Sciara, NP 10/02/2021, 8:16 AM

## 2021-10-02 ENCOUNTER — Encounter: Payer: Self-pay | Admitting: Nurse Practitioner

## 2021-10-02 ENCOUNTER — Ambulatory Visit: Payer: Medicaid Other | Admitting: Nurse Practitioner

## 2021-10-02 VITALS — BP 112/62 | HR 62 | Ht 67.0 in | Wt 242.8 lb

## 2021-10-02 DIAGNOSIS — J449 Chronic obstructive pulmonary disease, unspecified: Secondary | ICD-10-CM

## 2021-10-02 DIAGNOSIS — I4729 Other ventricular tachycardia: Secondary | ICD-10-CM | POA: Diagnosis not present

## 2021-10-02 DIAGNOSIS — I5022 Chronic systolic (congestive) heart failure: Secondary | ICD-10-CM | POA: Diagnosis not present

## 2021-10-02 DIAGNOSIS — Z720 Tobacco use: Secondary | ICD-10-CM

## 2021-10-02 DIAGNOSIS — I493 Ventricular premature depolarization: Secondary | ICD-10-CM | POA: Diagnosis not present

## 2021-10-02 DIAGNOSIS — I10 Essential (primary) hypertension: Secondary | ICD-10-CM

## 2021-10-02 DIAGNOSIS — Z95810 Presence of automatic (implantable) cardiac defibrillator: Secondary | ICD-10-CM | POA: Diagnosis not present

## 2021-10-02 DIAGNOSIS — G4733 Obstructive sleep apnea (adult) (pediatric): Secondary | ICD-10-CM

## 2021-10-02 NOTE — Patient Instructions (Signed)
Medication Instructions:  Your physician recommends that you continue on your current medications as directed. Please refer to the Current Medication list given to you today.   *If you need a refill on your cardiac medications before your next appointment, please call your pharmacy*   Lab Work: NONE ordered at this time of appointment   If you have labs (blood work) drawn today and your tests are completely normal, you will receive your results only by: [place] (if you have MyChart) OR A paper copy in the mail If you have any lab test that is abnormal or we need to change your treatment, we will call you to review the results.   Testing/Procedures: NONE ordered at this time of appointment     Follow-Up: At [facility], you and your health needs are our priority.  As part of our continuing mission to provide you with exceptional heart care, we have created designated Provider Care Teams.  These Care Teams include your primary Cardiologist (physician) and Advanced Practice Providers (APPs -  Physician Assistants and Nurse Practitioners) who all work together to provide you with the care you need, when you need it.  We recommend signing up for the patient portal called "MyChart".  Sign up information is provided on this After Visit Summary.  MyChart is used to connect with patients for Virtual Visits (Telemedicine).  Patients are able to view lab/test results, encounter notes, upcoming appointments, etc.  Non-urgent messages can be sent to your provider as well.   To learn more about what you can do with MyChart, go to [email or web address].    Your next appointment:   3-4 month(s)  The format for your next appointment:   In Person  Provider:   Minus Breeding, MD     Other Instructions   Important Information About Sugar

## 2021-10-27 ENCOUNTER — Ambulatory Visit (INDEPENDENT_AMBULATORY_CARE_PROVIDER_SITE_OTHER): Payer: Medicaid Other

## 2021-10-27 DIAGNOSIS — I447 Left bundle-branch block, unspecified: Secondary | ICD-10-CM

## 2021-10-28 LAB — CUP PACEART REMOTE DEVICE CHECK
Battery Remaining Longevity: 71 mo
Battery Remaining Percentage: 81 %
Battery Voltage: 2.98 V
Brady Statistic AP VP Percent: 77 %
Brady Statistic AP VS Percent: 1 %
Brady Statistic AS VP Percent: 20 %
Brady Statistic AS VS Percent: 1 %
Brady Statistic RA Percent Paced: 74 %
Date Time Interrogation Session: 20230627020038
HighPow Impedance: 84 Ohm
Implantable Lead Implant Date: 20220328
Implantable Lead Implant Date: 20220328
Implantable Lead Implant Date: 20220328
Implantable Lead Location: 753858
Implantable Lead Location: 753859
Implantable Lead Location: 753860
Implantable Pulse Generator Implant Date: 20220328
Lead Channel Impedance Value: 1300 Ohm
Lead Channel Impedance Value: 410 Ohm
Lead Channel Impedance Value: 480 Ohm
Lead Channel Pacing Threshold Amplitude: 0.75 V
Lead Channel Pacing Threshold Amplitude: 0.75 V
Lead Channel Pacing Threshold Amplitude: 1 V
Lead Channel Pacing Threshold Pulse Width: 0.5 ms
Lead Channel Pacing Threshold Pulse Width: 0.5 ms
Lead Channel Pacing Threshold Pulse Width: 0.5 ms
Lead Channel Sensing Intrinsic Amplitude: 1.3 mV
Lead Channel Sensing Intrinsic Amplitude: 11.9 mV
Lead Channel Setting Pacing Amplitude: 2 V
Lead Channel Setting Pacing Amplitude: 2 V
Lead Channel Setting Pacing Amplitude: 2.5 V
Lead Channel Setting Pacing Pulse Width: 0.5 ms
Lead Channel Setting Pacing Pulse Width: 0.5 ms
Lead Channel Setting Sensing Sensitivity: 0.5 mV
Pulse Gen Serial Number: 810019794

## 2021-11-04 ENCOUNTER — Other Ambulatory Visit (HOSPITAL_COMMUNITY): Payer: Self-pay

## 2021-11-17 NOTE — Progress Notes (Signed)
Remote ICD transmission.   

## 2021-11-20 ENCOUNTER — Other Ambulatory Visit (HOSPITAL_COMMUNITY): Payer: Self-pay

## 2021-11-26 ENCOUNTER — Encounter: Payer: Medicaid Other | Admitting: Cardiology

## 2021-12-04 ENCOUNTER — Other Ambulatory Visit (HOSPITAL_COMMUNITY): Payer: Self-pay

## 2021-12-23 ENCOUNTER — Other Ambulatory Visit (HOSPITAL_COMMUNITY): Payer: Self-pay

## 2021-12-23 ENCOUNTER — Other Ambulatory Visit: Payer: Self-pay | Admitting: Cardiology

## 2021-12-23 MED ORDER — SPIRONOLACTONE 25 MG PO TABS
25.0000 mg | ORAL_TABLET | Freq: Every day | ORAL | 3 refills | Status: DC
  Filled 2021-12-23: qty 90, 90d supply, fill #0
  Filled 2022-03-16: qty 90, 90d supply, fill #1
  Filled 2022-06-20: qty 90, 90d supply, fill #2
  Filled 2022-09-16: qty 90, 90d supply, fill #3

## 2021-12-24 ENCOUNTER — Other Ambulatory Visit (HOSPITAL_COMMUNITY): Payer: Self-pay

## 2022-01-04 NOTE — Progress Notes (Unsigned)
Cardiology Office Note   Date:  01/06/2022   ID:  Cody Carpenter, DOB 07-10-1965, MRN [id number]  PCP:  Patient, No Pcp Per  Cardiologist:   Rollene Rotunda, MD   Chief Complaint  Patient presents with   Cardiomyopathy       History of Present Illness: Cody Carpenter is a 56 y.o. 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  His EF had been 20% in 2008 but improved with medical management. Unfortunately he eventually stopped his meds and his EF went from a high of 55% to 25% in 2015 and after med titration only went up to 30% in 2016.  However, in August his EF was 20 - 25%.   I have had a difficult time titrating his meds because of hypotension.  I did send him for a CPX recently.  He had a submax effort.  Peak VO2 was 17.5.     He is now status post BiV ICD. He had increased PVCs recently and was started on mexileitine.    He says that he has been doing well since he was last seen.  His palpitations happen sporadically but they are much better than they used to be. The patient denies any new symptoms such as chest discomfort, neck or arm discomfort. There has been no new shortness of breath, PND or orthopnea. There have been no reported palpitations, presyncope or syncope.  He does activities such as pushing a lawnmower.  He does fatigue.   Past Medical History:  Diagnosis Date   CHF (congestive heart failure) (HCC)    Hypertension    Sleep apnea    CPAP    Past Surgical History:  Procedure Laterality Date   BIV ICD INSERTION CRT-D N/A 07/28/2020   Procedure: BIV ICD INSERTION CRT-D;  Surgeon: Lanier Prude, MD;  Location: [facility];  Service: Cardiovascular;  Laterality: N/A;   NO PAST SURGERIES       Current Outpatient Medications  Medication Sig Dispense Refill   amiodarone (PACERONE) 200 MG tablet Take 1 tablet by mouth twice daily for 14 days then take 1 tablet daily. 90 tablet 3   carvedilol (COREG) 25 MG  tablet TAKE 1 TABLET BY MOUTH TWICE DAILY WITH A MEAL 180 tablet 3   HYDROcodone-acetaminophen (NORCO/VICODIN) 5-325 MG tablet Take 1 tablet by mouth every 6 hours as needed for pain 6 tablet 0   mexiletine (MEXITIL) 150 MG capsule Take 1 capsule (150 mg total) by mouth 2 (two) times daily. 180 capsule 3   sacubitril-valsartan (ENTRESTO) 97-103 MG Take 1 tablet by mouth 2 times daily. 60 tablet 5   spironolactone (ALDACTONE) 25 MG tablet Take 1 tablet (25 mg total) by mouth at bedtime. 90 tablet 3   No current facility-administered medications for this visit.    Allergies:   Patient has no known allergies.    ROS:  Please see the history of present illness.   Otherwise, review of systems are positive for none.   All other systems are reviewed and negative.    PHYSICAL EXAM: VS:  BP 100/64   Pulse 69   Ht 5\' 7"  (1.702 m)   Wt 239 lb 12.8 oz (108.8 kg)   SpO2 97%   BMI 37.56 kg/m  , BMI Body mass index is 37.56 kg/m. GENERAL:  Well appearing NECK:  No jugular venous distention, waveform within normal limits, carotid upstroke brisk and symmetric,  no bruits, no thyromegaly LUNGS:  Clear to auscultation bilaterally CHEST:  Well healed sternotomy scar. HEART:  PMI not displaced or sustained,S1 and S2 within normal limits, no S3, no S4, no clicks, no rubs, no murmurs ABD:  Flat, positive bowel sounds normal in frequency in pitch, no bruits, no rebound, no guarding, no midline pulsatile mass, no hepatomegaly, no splenomegaly EXT:  2 plus pulses throughout, no edema, no cyanosis no clubbing   EKG:  EKG is not ordered today. NA  Recent Labs: 08/26/2021: ALT 20; BUN 15; Creatinine, Ser 1.01; Hemoglobin 14.7; Platelets 213; Potassium 5.0; Sodium 142    Lipid Panel    Component Value Date/Time   CHOL  03/02/2007 0838    170        ATP III CLASSIFICATION:  <200     mg/dL   Desirable  829-562  mg/dL   Borderline High  >=130    mg/dL   High   TRIG 865 (H) 78/46/9629 0838   HDL 26 (L)  03/02/2007 0838   CHOLHDL 6.5 03/02/2007 0838   VLDL 58 (H) 03/02/2007 0838   LDLCALC  03/02/2007 0838    86        Total Cholesterol/HDL:CHD Risk Coronary Heart Disease Risk Table                     Men   Women  1/2 Average Risk   3.4   3.3      Wt Readings from Last 3 Encounters:  01/06/22 239 lb 12.8 oz (108.8 kg)  10/02/21 242 lb 12.8 oz (110.1 kg)  08/26/21 241 lb (109.3 kg)      Other studies Reviewed: Additional studies/ records that were reviewed today include: Labs Review of the above records demonstrates: See elsewhere  ASSESSMENT AND PLAN:    CHRONIC SYSTOLIC HF:    He seems to be euvolemic.  He did not want to consider [place] with cost but he is tolerating other medications.  The blood pressure will not allow med titration.  No change in therapy.  PVCs:   He is now status post CRT-D and on optimal medical therapy that his blood pressure will allow.  HTN:    This is being managed in the context of treating his CHF  TOBACCO ABUSE: He is unable to quit smoking and we talked about this again today.  SLEEP APNEA:    He is using CPAP.     Current medicines are reviewed at length with the patient today.  The patient does not have concerns regarding medicines.  The following changes have been made:    None  Labs/ tests ordered today include:   None  No orders of the defined types were placed in this encounter.   Disposition:   FU with April.  He has follow up with EP in six months.    Signed, Rollene Rotunda, MD  01/06/2022 9:32 AM    [facility]

## 2022-01-06 ENCOUNTER — Encounter: Payer: Self-pay | Admitting: Cardiology

## 2022-01-06 ENCOUNTER — Ambulatory Visit: Payer: Medicaid Other | Attending: Cardiology | Admitting: Cardiology

## 2022-01-06 VITALS — BP 100/64 | HR 69 | Ht 67.0 in | Wt 239.8 lb

## 2022-01-06 DIAGNOSIS — I493 Ventricular premature depolarization: Secondary | ICD-10-CM

## 2022-01-06 DIAGNOSIS — I5022 Chronic systolic (congestive) heart failure: Secondary | ICD-10-CM | POA: Diagnosis not present

## 2022-01-06 DIAGNOSIS — Z720 Tobacco use: Secondary | ICD-10-CM

## 2022-01-06 DIAGNOSIS — G4733 Obstructive sleep apnea (adult) (pediatric): Secondary | ICD-10-CM

## 2022-01-06 DIAGNOSIS — I10 Essential (primary) hypertension: Secondary | ICD-10-CM

## 2022-01-06 NOTE — Patient Instructions (Signed)
Medication Instructions:  Continue same medications *If you need a refill on your cardiac medications before your next appointment, please call your pharmacy*   Lab Work: None ordered   Testing/Procedures: None ordered   Follow-Up: At [facility], you and your health needs are our priority.  As part of our continuing mission to provide you with exceptional heart care, we have created designated Provider Care Teams.  These Care Teams include your primary Cardiologist (physician) and Advanced Practice Providers (APPs -  Physician Assistants and Nurse Practitioners) who all work together to provide you with the care you need, when you need it.  We recommend signing up for the patient portal called "MyChart".  Sign up information is provided on this After Visit Summary.  MyChart is used to connect with patients for Virtual Visits (Telemedicine).  Patients are able to view lab/test results, encounter notes, upcoming appointments, etc.  Non-urgent messages can be sent to your provider as well.   To learn more about what you can do with MyChart, go to [email or web address].    Your next appointment: April    Call in Dec to schedule April appointment      The format for your next appointment: Office   Provider:  Dr.Hochrein   Important Information About Sugar

## 2022-01-08 ENCOUNTER — Other Ambulatory Visit (HOSPITAL_COMMUNITY): Payer: Self-pay

## 2022-01-26 ENCOUNTER — Ambulatory Visit (INDEPENDENT_AMBULATORY_CARE_PROVIDER_SITE_OTHER): Payer: Medicaid Other

## 2022-01-26 DIAGNOSIS — I420 Dilated cardiomyopathy: Secondary | ICD-10-CM

## 2022-01-26 LAB — CUP PACEART REMOTE DEVICE CHECK
Battery Remaining Longevity: 68 mo
Battery Remaining Percentage: 77 %
Battery Voltage: 2.98 V
Brady Statistic AP VP Percent: 78 %
Brady Statistic AP VS Percent: 1 %
Brady Statistic AS VP Percent: 21 %
Brady Statistic AS VS Percent: 1 %
Brady Statistic RA Percent Paced: 76 %
Date Time Interrogation Session: 20230926020143
HighPow Impedance: 92 Ohm
Implantable Lead Implant Date: 20220328
Implantable Lead Implant Date: 20220328
Implantable Lead Implant Date: 20220328
Implantable Lead Location: 753858
Implantable Lead Location: 753859
Implantable Lead Location: 753860
Implantable Pulse Generator Implant Date: 20220328
Lead Channel Impedance Value: 1325 Ohm
Lead Channel Impedance Value: 430 Ohm
Lead Channel Impedance Value: 490 Ohm
Lead Channel Pacing Threshold Amplitude: 0.75 V
Lead Channel Pacing Threshold Amplitude: 0.75 V
Lead Channel Pacing Threshold Amplitude: 1 V
Lead Channel Pacing Threshold Pulse Width: 0.5 ms
Lead Channel Pacing Threshold Pulse Width: 0.5 ms
Lead Channel Pacing Threshold Pulse Width: 0.5 ms
Lead Channel Sensing Intrinsic Amplitude: 1.5 mV
Lead Channel Sensing Intrinsic Amplitude: 12 mV
Lead Channel Setting Pacing Amplitude: 2 V
Lead Channel Setting Pacing Amplitude: 2 V
Lead Channel Setting Pacing Amplitude: 2.5 V
Lead Channel Setting Pacing Pulse Width: 0.5 ms
Lead Channel Setting Pacing Pulse Width: 0.5 ms
Lead Channel Setting Sensing Sensitivity: 0.5 mV
Pulse Gen Serial Number: 810019794

## 2022-02-04 NOTE — Progress Notes (Signed)
Remote ICD transmission.   

## 2022-02-05 ENCOUNTER — Encounter: Payer: Medicaid Other | Admitting: Cardiology

## 2022-02-10 ENCOUNTER — Other Ambulatory Visit (HOSPITAL_COMMUNITY): Payer: Self-pay

## 2022-02-10 ENCOUNTER — Other Ambulatory Visit: Payer: Self-pay | Admitting: Cardiology

## 2022-02-10 MED ORDER — ENTRESTO 97-103 MG PO TABS
1.0000 | ORAL_TABLET | Freq: Two times a day (BID) | ORAL | 5 refills | Status: DC
  Filled 2022-02-10: qty 60, 30d supply, fill #0
  Filled 2022-03-16: qty 60, 30d supply, fill #1
  Filled 2022-04-21: qty 60, 30d supply, fill #2
  Filled 2022-05-25: qty 60, 30d supply, fill #3
  Filled 2022-07-01: qty 60, 30d supply, fill #4
  Filled 2022-08-01: qty 60, 30d supply, fill #5

## 2022-03-10 ENCOUNTER — Other Ambulatory Visit (HOSPITAL_COMMUNITY): Payer: Self-pay

## 2022-03-17 ENCOUNTER — Other Ambulatory Visit (HOSPITAL_COMMUNITY): Payer: Self-pay

## 2022-03-19 ENCOUNTER — Other Ambulatory Visit (HOSPITAL_COMMUNITY): Payer: Self-pay

## 2022-04-11 NOTE — Progress Notes (Unsigned)
Electrophysiology Office Follow up Visit Note:    Date:  04/11/2022   ID:  Cody Carpenter, DOB Apr 28, 1966, MRN [id number]  PCP:  Patient, No Pcp Per  [facility] Cardiologist:  Rollene Rotunda, MD  [facility] Electrophysiologist:  Lanier Prude, MD    Interval History:    Cody Carpenter is a 56 y.o. male who presents for a follow up visit. He has advanced HF and has a CRT in situ. He saw Mardelle Matte in clinic 08/26/2021. He has PVCs and takes amio and mexiletine for suppression to facilitate better biv pacing percentages.  Amiodarone has helped improve his BiV pacing % to >99%. He presents for follow up.      Past Medical History:  Diagnosis Date   CHF (congestive heart failure) (HCC)    Hypertension    Sleep apnea    CPAP    Past Surgical History:  Procedure Laterality Date   BIV ICD INSERTION CRT-D N/A 07/28/2020   Procedure: BIV ICD INSERTION CRT-D;  Surgeon: Lanier Prude, MD;  Location: [facility] INVASIVE [facility];  Service: Cardiovascular;  Laterality: N/A;   NO PAST SURGERIES      Current Medications: No outpatient medications have been marked as taking for the 04/12/22 encounter (Appointment) with Lanier Prude, MD.     Allergies:   Patient has no known allergies.   Social History   Socioeconomic History   Marital status: Single    Spouse name: Not on file   Number of children: 3   Years of education: Not on file   Highest education level: Not on file  Occupational History   Occupation: roofer  Tobacco Use   Smoking status: Every Day    Packs/day: 1.00    Years: 25.00    Total pack years: 25.00    Types: Cigarettes   Smokeless tobacco: Never  Substance and Sexual Activity   Alcohol use: Yes   Drug use: No   Sexual activity: Not on file  Other Topics Concern   Not on file  Social History Narrative   Not on file   Social Determinants of Health   Financial Resource Strain: High Risk (07/24/2020)   Overall Financial Resource Strain (CARDIA)     Difficulty of Paying Living Expenses: Very hard  Food Insecurity: No Food Insecurity (07/24/2020)   Hunger Vital Sign    Worried About Running Out of Food in the Last Year: Never true    Ran Out of Food in the Last Year: Never true  Transportation Needs: No Transportation Needs (04/17/2020)   PRAPARE - Administrator, Civil Service (Medical): No    Lack of Transportation (Non-Medical): No  Physical Activity: Not on file  Stress: Not on file  Social Connections: Not on file     Family History: The patient's family history includes Heart disease in his maternal grandfather; Lung cancer in his maternal grandmother.  ROS:   Please see the history of present illness.    All other systems reviewed and are negative.  EKGs/Labs/Other Studies Reviewed:    The following studies were reviewed today:  12/05/2020 echo - EF 25%, RV normal  04/12/2022 in clinic device interrogation personally reviewed ***  Recent Labs: 08/26/2021: ALT 20; BUN 15; Creatinine, Ser 1.01; Hemoglobin 14.7; Platelets 213; Potassium 5.0; Sodium 142  Recent Lipid Panel    Component Value Date/Time   CHOL  03/02/2007 0838    170        ATP III CLASSIFICATION:  <  200     mg/dL   Desirable  703-500  mg/dL   Borderline High  >=938    mg/dL   High   TRIG 182 (H) 99/37/1696 0838   HDL 26 (L) 03/02/2007 0838   CHOLHDL 6.5 03/02/2007 0838   VLDL 58 (H) 03/02/2007 0838   LDLCALC  03/02/2007 0838    86        Total Cholesterol/HDL:CHD Risk Coronary Heart Disease Risk Table                     Men   Women  1/2 Average Risk   3.4   3.3    Physical Exam:    VS:  There were no vitals taken for this visit.    Wt Readings from Last 3 Encounters:  01/06/22 239 lb 12.8 oz (108.8 kg)  10/02/21 242 lb 12.8 oz (110.1 kg)  08/26/21 241 lb (109.3 kg)     GEN: *** Well nourished, well developed in no acute distress HEENT: Normal NECK: No JVD; No carotid bruits LYMPHATICS: No lymphadenopathy CARDIAC:  ***RRR, no murmurs, rubs, gallops. Prepectoral pocket well healed. RESPIRATORY:  Clear to auscultation without rales, wheezing or rhonchi  ABDOMEN: Soft, non-tender, non-distended MUSCULOSKELETAL:  No edema; No deformity  SKIN: Warm and dry NEUROLOGIC:  Alert and oriented x 3 PSYCHIATRIC:  Normal affect        ASSESSMENT:    1. Chronic systolic heart failure (HCC)   2. NSVT (nonsustained ventricular tachycardia) (HCC)   3. Presence of cardiac resynchronization therapy defibrillator (CRT-D)    PLAN:    In order of problems listed above:  #Chronic systolic HF #CRT-D in situ NYHA III. Warm and dry on exam. EF 25% in 12/2020. - repeat TTE now that his biV pacing is better - cont GDMT  #PVC Continue amiodarone and mexiletine  - repeat CMP, TSH and FT4 today  Follow up 6 mo with APP.    Medication Adjustments/Labs and Tests Ordered: Current medicines are reviewed at length with the patient today.  Concerns regarding medicines are outlined above.  No orders of the defined types were placed in this encounter.  No orders of the defined types were placed in this encounter.    Signed, Steffanie Dunn, MD, [facility], [facility] 04/11/2022 8:00 PM    Electrophysiology [facility]

## 2022-04-12 ENCOUNTER — Encounter: Payer: Self-pay | Admitting: Cardiology

## 2022-04-12 ENCOUNTER — Ambulatory Visit: Payer: Medicaid Other | Attending: Cardiology | Admitting: Cardiology

## 2022-04-12 VITALS — BP 124/68 | HR 70 | Ht 67.5 in | Wt 242.4 lb

## 2022-04-12 DIAGNOSIS — Z79899 Other long term (current) drug therapy: Secondary | ICD-10-CM

## 2022-04-12 DIAGNOSIS — I4729 Other ventricular tachycardia: Secondary | ICD-10-CM

## 2022-04-12 DIAGNOSIS — Z95810 Presence of automatic (implantable) cardiac defibrillator: Secondary | ICD-10-CM

## 2022-04-12 DIAGNOSIS — I5022 Chronic systolic (congestive) heart failure: Secondary | ICD-10-CM

## 2022-04-12 NOTE — Progress Notes (Signed)
Electrophysiology Office Follow up Visit Note:    Date:  04/12/2022   ID:  Cody Carpenter, DOB Nov 19, 1965, MRN [id number]  PCP:  Patient, No Pcp Per  [place] Cardiologist:  Minus Breeding, MD  [facility] Electrophysiologist:  Vickie Epley, MD    Interval History:    Cody Carpenter is a 56 y.o. male who presents for a follow up visit. He has advanced HF and has a CRT in situ. He saw Jonni Sanger in clinic 08/26/2021. He has PVCs and takes amio and mexiletine for suppression to facilitate better biv pacing percentages.  Amiodarone has helped improve his BiV pacing % to >99%. He presents for follow up.  Today, he appears well. At this time he is tolerating amiodarone.  His energy levels have improved but are not yet where he would like. He is able to spend 4-5 hours shopping or completing errands before he needs to go home and rest.  He denies any palpitations, chest pain, shortness of breath, or peripheral edema. No lightheadedness, headaches, syncope, orthopnea, or PND.      Past Medical History:  Diagnosis Date   CHF (congestive heart failure) ([place])    Hypertension    Sleep apnea    CPAP    Past Surgical History:  Procedure Laterality Date   BIV ICD INSERTION CRT-D N/A 07/28/2020   Procedure: BIV ICD INSERTION CRT-D;  Surgeon: Vickie Epley, MD;  Location: [facility];  Service: Cardiovascular;  Laterality: N/A;   NO PAST SURGERIES      Current Medications: Current Meds  Medication Sig   amiodarone (PACERONE) 200 MG tablet Take 1 tablet by mouth twice daily for 14 days then take 1 tablet daily.   carvedilol (COREG) 25 MG tablet TAKE 1 TABLET BY MOUTH TWICE DAILY WITH A MEAL   HYDROcodone-acetaminophen (NORCO/VICODIN) 5-325 MG tablet Take 1 tablet by mouth every 6 hours as needed for pain   mexiletine (MEXITIL) 150 MG capsule Take 1 capsule (150 mg total) by mouth 2 (two) times daily.   sacubitril-valsartan (ENTRESTO) 97-103 MG Take 1 tablet by mouth 2  times daily.   spironolactone (ALDACTONE) 25 MG tablet Take 1 tablet (25 mg total) by mouth at bedtime.     Allergies:   Patient has no known allergies.   Social History   Socioeconomic History   Marital status: Single    Spouse name: Not on file   Number of children: 3   Years of education: Not on file   Highest education level: Not on file  Occupational History   Occupation: roofer  Tobacco Use   Smoking status: Every Day    Packs/day: 1.00    Years: 25.00    Total pack years: 25.00    Types: Cigarettes   Smokeless tobacco: Never  Substance and Sexual Activity   Alcohol use: Yes   Drug use: No   Sexual activity: Not on file  Other Topics Concern   Not on file  Social History Narrative   Not on file   Social Determinants of Health   Financial Resource Strain: High Risk (07/24/2020)   Overall Financial Resource Strain (CARDIA)    Difficulty of Paying Living Expenses: Very hard  Food Insecurity: No Food Insecurity (07/24/2020)   Hunger Vital Sign    Worried About Running Out of Food in the Last Year: Never true    Ran Out of Food in the Last Year: Never true  Transportation Needs: No Transportation Needs (04/17/2020)  PRAPARE - Hydrologist (Medical): No    Lack of Transportation (Non-Medical): No  Physical Activity: Not on file  Stress: Not on file  Social Connections: Not on file     Family History: The patient's family history includes Heart disease in his maternal grandfather; Lung cancer in his maternal grandmother.  ROS:   Please see the history of present illness.    All other systems reviewed and are negative.  EKGs/Labs/Other Studies Reviewed:    The following studies were reviewed today:  12/05/2020 echo - EF 25%, RV normal  04/12/2022 in clinic device interrogation personally reviewed BiV pacing greater than 99%.  Atrial pacing 78% Lead parameter stable No high-voltage therapies Longevity 5.5 years   Recent  Labs: 08/26/2021: ALT 20; BUN 15; Creatinine, Ser 1.01; Hemoglobin 14.7; Platelets 213; Potassium 5.0; Sodium 142   Recent Lipid Panel    Component Value Date/Time   CHOL  03/02/2007 0838    170        ATP III CLASSIFICATION:  <200     mg/dL   Desirable  200-239  mg/dL   Borderline High  >=240    mg/dL   High   TRIG 290 (H) 03/02/2007 0838   HDL 26 (L) 03/02/2007 0838   CHOLHDL 6.5 03/02/2007 0838   VLDL 58 (H) 03/02/2007 0838   LDLCALC  03/02/2007 0838    86        Total Cholesterol/HDL:CHD Risk Coronary Heart Disease Risk Table                     Men   Women  1/2 Average Risk   3.4   3.3    Physical Exam:    VS:  BP 124/68   Pulse 70   Ht 5' 7.5" (1.715 m)   Wt 242 lb 6.4 oz (110 kg)   SpO2 95%   BMI 37.40 kg/m     Wt Readings from Last 3 Encounters:  04/12/22 242 lb 6.4 oz (110 kg)  01/06/22 239 lb 12.8 oz (108.8 kg)  10/02/21 242 lb 12.8 oz (110.1 kg)     GEN: Well nourished, well developed in no acute distress HEENT: Normal NECK: No JVD; No carotid bruits LYMPHATICS: No lymphadenopathy CARDIAC: RRR, no murmurs, rubs, gallops. Prepectoral pocket well healed. RESPIRATORY:  Clear to auscultation without rales, wheezing or rhonchi  ABDOMEN: Soft, non-tender, non-distended MUSCULOSKELETAL:  No edema; No deformity  SKIN: Warm and dry NEUROLOGIC:  Alert and oriented x 3 PSYCHIATRIC:  Normal affect        ASSESSMENT:    1. Chronic systolic heart failure ([place])   2. NSVT (nonsustained ventricular tachycardia) ([place])   3. Presence of cardiac resynchronization therapy defibrillator (CRT-D)   4. Encounter for long-term (current) use of high-risk medication    PLAN:    In order of problems listed above:  #Chronic systolic HF #CRT-D in situ NYHA III. Warm and dry on exam. EF 25% in 12/2020. - repeat TTE now that his biV pacing is better - cont GDMT  #PVC Continue amiodarone and mexiletine  #Amiodarone monitoring - repeat CMP, TSH and FT4  today   Follow up 6 mo with APP.  Will need CMP, TSH and free T4 repeated at that visit.    Medication Adjustments/Labs and Tests Ordered: Current medicines are reviewed at length with the patient today.  Concerns regarding medicines are outlined above.   Orders Placed This Encounter  Procedures  Comprehensive metabolic panel   T4, free   TSH   ECHOCARDIOGRAM COMPLETE   No orders of the defined types were placed in this encounter.  I,Mathew Stumpf,acting as a Neurosurgeon for Lanier Prude, MD.,have documented all relevant documentation on the behalf of Lanier Prude, MD,as directed by  Lanier Prude, MD while in the presence of Lanier Prude, MD.  I, Lanier Prude, MD, have reviewed all documentation for this visit. The documentation on 04/12/22 for the exam, diagnosis, procedures, and orders are all accurate and complete.   Signed, Steffanie Dunn, MD, [facility], [facility] 04/12/2022 2:18 PM    Electrophysiology [facility]

## 2022-04-12 NOTE — Patient Instructions (Signed)
Medication Instructions:  Your physician recommends that you continue on your current medications as directed. Please refer to the Current Medication list given to you today.  *If you need a refill on your cardiac medications before your next appointment, please call your pharmacy*  Lab Work: TODAY: CMET, TSH, T4 If you have labs (blood work) drawn today and your tests are completely normal, you will receive your results only by: MyChart Message (if you have MyChart) OR A paper copy in the mail If you have any lab test that is abnormal or we need to change your treatment, we will call you to review the results.  Testing/Procedures: Your physician has requested that you have an echocardiogram. Echocardiography is a painless test that uses sound waves to create images of your heart. It provides your doctor with information about the size and shape of your heart and how well your heart's chambers and valves are working. This procedure takes approximately one hour. There are no restrictions for this procedure. Please do NOT wear cologne, perfume, aftershave, or lotions (deodorant is allowed). Please arrive 15 minutes prior to your appointment time.  Follow-Up: At [facility], you and your health needs are our priority.  As part of our continuing mission to provide you with exceptional heart care, we have created designated Provider Care Teams.  These Care Teams include your primary Cardiologist (physician) and Advanced Practice Providers (APPs -  Physician Assistants and Nurse Practitioners) who all work together to provide you with the care you need, when you need it.  Your next appointment:   6 month(s)  The format for your next appointment:   In Person  Provider:   You will see one of the following Advanced Practice Providers on your designated Care Team:   Francis Dowse, [place] Casimiro Needle "Mardelle Matte" Lanna Poche, [place]  Important Information About Sugar

## 2022-04-13 LAB — COMPREHENSIVE METABOLIC PANEL
ALT: 23 IU/L (ref 0–44)
AST: 15 IU/L (ref 0–40)
Albumin/Globulin Ratio: 1.4 (ref 1.2–2.2)
Albumin: 4.2 g/dL (ref 3.8–4.9)
Alkaline Phosphatase: 100 IU/L (ref 44–121)
BUN/Creatinine Ratio: 19 (ref 9–20)
BUN: 19 mg/dL (ref 6–24)
Bilirubin Total: 0.2 mg/dL (ref 0.0–1.2)
CO2: 20 mmol/L (ref 20–29)
Calcium: 9.2 mg/dL (ref 8.7–10.2)
Chloride: 100 mmol/L (ref 96–106)
Creatinine, Ser: 1.02 mg/dL (ref 0.76–1.27)
Globulin, Total: 2.9 g/dL (ref 1.5–4.5)
Glucose: 80 mg/dL (ref 70–99)
Potassium: 4.3 mmol/L (ref 3.5–5.2)
Sodium: 137 mmol/L (ref 134–144)
Total Protein: 7.1 g/dL (ref 6.0–8.5)
eGFR: 87 mL/min/{1.73_m2} (ref >59)

## 2022-04-13 LAB — T4, FREE: Free T4: 1.53 ng/dL (ref 0.82–1.77)

## 2022-04-13 LAB — TSH: TSH: 2 u[IU]/mL (ref 0.450–4.500)

## 2022-04-16 ENCOUNTER — Encounter: Payer: Self-pay | Admitting: Cardiology

## 2022-04-22 ENCOUNTER — Other Ambulatory Visit: Payer: Self-pay

## 2022-04-23 ENCOUNTER — Other Ambulatory Visit (HOSPITAL_COMMUNITY): Payer: Self-pay

## 2022-04-27 ENCOUNTER — Ambulatory Visit (INDEPENDENT_AMBULATORY_CARE_PROVIDER_SITE_OTHER): Payer: Medicaid Other

## 2022-04-27 DIAGNOSIS — I420 Dilated cardiomyopathy: Secondary | ICD-10-CM

## 2022-04-28 LAB — CUP PACEART REMOTE DEVICE CHECK
Battery Remaining Longevity: 65 mo
Battery Remaining Percentage: 74 %
Battery Voltage: 2.98 V
Brady Statistic AP VP Percent: 79 %
Brady Statistic AP VS Percent: 1 %
Brady Statistic AS VP Percent: 20 %
Brady Statistic AS VS Percent: 1 %
Brady Statistic RA Percent Paced: 78 %
Date Time Interrogation Session: 20231226020005
HighPow Impedance: 89 Ohm
Implantable Lead Connection Status: 753985
Implantable Lead Connection Status: 753985
Implantable Lead Connection Status: 753985
Implantable Lead Implant Date: 20220328
Implantable Lead Implant Date: 20220328
Implantable Lead Implant Date: 20220328
Implantable Lead Location: 753858
Implantable Lead Location: 753859
Implantable Lead Location: 753860
Implantable Pulse Generator Implant Date: 20220328
Lead Channel Impedance Value: 1275 Ohm
Lead Channel Impedance Value: 410 Ohm
Lead Channel Impedance Value: 490 Ohm
Lead Channel Pacing Threshold Amplitude: 0.75 V
Lead Channel Pacing Threshold Amplitude: 0.75 V
Lead Channel Pacing Threshold Amplitude: 1.25 V
Lead Channel Pacing Threshold Pulse Width: 0.5 ms
Lead Channel Pacing Threshold Pulse Width: 0.5 ms
Lead Channel Pacing Threshold Pulse Width: 0.5 ms
Lead Channel Sensing Intrinsic Amplitude: 0.7 mV
Lead Channel Sensing Intrinsic Amplitude: 12 mV
Lead Channel Setting Pacing Amplitude: 2 V
Lead Channel Setting Pacing Amplitude: 2 V
Lead Channel Setting Pacing Amplitude: 2.5 V
Lead Channel Setting Pacing Pulse Width: 0.5 ms
Lead Channel Setting Pacing Pulse Width: 0.5 ms
Lead Channel Setting Sensing Sensitivity: 0.5 mV
Pulse Gen Serial Number: 810019794
Zone Setting Status: 755011

## 2022-05-07 ENCOUNTER — Other Ambulatory Visit (HOSPITAL_COMMUNITY): Payer: Self-pay

## 2022-05-07 ENCOUNTER — Other Ambulatory Visit (HOSPITAL_COMMUNITY): Payer: Medicaid Other

## 2022-05-13 ENCOUNTER — Ambulatory Visit (HOSPITAL_COMMUNITY): Payer: Medicaid Other | Attending: Cardiology

## 2022-05-13 DIAGNOSIS — I5022 Chronic systolic (congestive) heart failure: Secondary | ICD-10-CM | POA: Diagnosis present

## 2022-05-13 DIAGNOSIS — I4729 Other ventricular tachycardia: Secondary | ICD-10-CM

## 2022-05-13 DIAGNOSIS — Z95810 Presence of automatic (implantable) cardiac defibrillator: Secondary | ICD-10-CM | POA: Diagnosis present

## 2022-05-13 LAB — ECHOCARDIOGRAM COMPLETE
Area-P 1/2: 3.39 cm2
S' Lateral: 5.2 cm

## 2022-05-19 ENCOUNTER — Telehealth: Payer: Self-pay

## 2022-05-19 DIAGNOSIS — I5022 Chronic systolic (congestive) heart failure: Secondary | ICD-10-CM

## 2022-05-19 NOTE — Telephone Encounter (Signed)
The patient has been notified of the result and verbalized understanding.  All questions (if any) were answered. Bernestine Amass, RN 05/19/2022 4:53 PM  Referral has been placed.

## 2022-05-19 NOTE — Telephone Encounter (Signed)
-----  Message from Vickie Epley, MD sent at 05/15/2022  5:37 PM EST ----- EF persistently reduced to 25%.  Carly, I would like to refer him to the Advanced HF clinic. Can you help put that in?  Thanks!  Lysbeth Galas T. Quentin Ore, MD, [facility], [facility] Cardiac Electrophysiology

## 2022-05-20 NOTE — Progress Notes (Signed)
Remote ICD transmission.   

## 2022-05-25 ENCOUNTER — Other Ambulatory Visit: Payer: Self-pay | Admitting: Cardiology

## 2022-05-26 ENCOUNTER — Other Ambulatory Visit: Payer: Self-pay

## 2022-05-27 ENCOUNTER — Other Ambulatory Visit (HOSPITAL_COMMUNITY): Payer: Self-pay

## 2022-05-27 MED ORDER — CARVEDILOL 25 MG PO TABS
25.0000 mg | ORAL_TABLET | Freq: Two times a day (BID) | ORAL | 1 refills | Status: DC
  Filled 2022-05-27: qty 180, 90d supply, fill #0
  Filled 2022-09-06: qty 180, 90d supply, fill #1

## 2022-06-20 ENCOUNTER — Other Ambulatory Visit: Payer: Self-pay | Admitting: Cardiology

## 2022-06-21 ENCOUNTER — Other Ambulatory Visit (HOSPITAL_COMMUNITY): Payer: Self-pay

## 2022-06-21 ENCOUNTER — Other Ambulatory Visit: Payer: Self-pay

## 2022-06-21 MED ORDER — MEXILETINE HCL 150 MG PO CAPS
150.0000 mg | ORAL_CAPSULE | Freq: Two times a day (BID) | ORAL | 3 refills | Status: DC
  Filled 2022-06-21: qty 180, 90d supply, fill #0
  Filled 2022-09-06: qty 180, 90d supply, fill #1
  Filled 2023-01-10: qty 180, 90d supply, fill #2
  Filled 2023-04-21: qty 180, 90d supply, fill #3

## 2022-06-22 ENCOUNTER — Other Ambulatory Visit: Payer: Self-pay

## 2022-06-22 ENCOUNTER — Other Ambulatory Visit (HOSPITAL_COMMUNITY): Payer: Self-pay

## 2022-06-23 ENCOUNTER — Other Ambulatory Visit: Payer: Self-pay

## 2022-06-23 ENCOUNTER — Other Ambulatory Visit (HOSPITAL_COMMUNITY): Payer: Self-pay

## 2022-06-24 ENCOUNTER — Other Ambulatory Visit (HOSPITAL_COMMUNITY): Payer: Self-pay

## 2022-07-01 ENCOUNTER — Other Ambulatory Visit: Payer: Self-pay

## 2022-07-15 ENCOUNTER — Encounter: Payer: Self-pay | Admitting: Cardiology

## 2022-07-27 ENCOUNTER — Ambulatory Visit (INDEPENDENT_AMBULATORY_CARE_PROVIDER_SITE_OTHER): Payer: Medicaid Other

## 2022-07-27 DIAGNOSIS — I420 Dilated cardiomyopathy: Secondary | ICD-10-CM

## 2022-07-27 LAB — CUP PACEART REMOTE DEVICE CHECK
Battery Remaining Longevity: 62 mo
Battery Remaining Percentage: 72 %
Battery Voltage: 2.98 V
Brady Statistic AP VP Percent: 81 %
Brady Statistic AP VS Percent: 1 %
Brady Statistic AS VP Percent: 18 %
Brady Statistic AS VS Percent: 1 %
Brady Statistic RA Percent Paced: 80 %
Date Time Interrogation Session: 20240325220811
HighPow Impedance: 89 Ohm
Implantable Lead Connection Status: 753985
Implantable Lead Connection Status: 753985
Implantable Lead Connection Status: 753985
Implantable Lead Implant Date: 20220328
Implantable Lead Implant Date: 20220328
Implantable Lead Implant Date: 20220328
Implantable Lead Location: 753858
Implantable Lead Location: 753859
Implantable Lead Location: 753860
Implantable Pulse Generator Implant Date: 20220328
Lead Channel Impedance Value: 1300 Ohm
Lead Channel Impedance Value: 410 Ohm
Lead Channel Impedance Value: 490 Ohm
Lead Channel Pacing Threshold Amplitude: 0.75 V
Lead Channel Pacing Threshold Amplitude: 0.75 V
Lead Channel Pacing Threshold Amplitude: 1.25 V
Lead Channel Pacing Threshold Pulse Width: 0.5 ms
Lead Channel Pacing Threshold Pulse Width: 0.5 ms
Lead Channel Pacing Threshold Pulse Width: 0.5 ms
Lead Channel Sensing Intrinsic Amplitude: 1.3 mV
Lead Channel Sensing Intrinsic Amplitude: 11.9 mV
Lead Channel Setting Pacing Amplitude: 2 V
Lead Channel Setting Pacing Amplitude: 2 V
Lead Channel Setting Pacing Amplitude: 2.5 V
Lead Channel Setting Pacing Pulse Width: 0.5 ms
Lead Channel Setting Pacing Pulse Width: 0.5 ms
Lead Channel Setting Sensing Sensitivity: 0.5 mV
Pulse Gen Serial Number: 810019794
Zone Setting Status: 755011

## 2022-08-01 ENCOUNTER — Other Ambulatory Visit: Payer: Self-pay | Admitting: Student

## 2022-08-02 ENCOUNTER — Other Ambulatory Visit (HOSPITAL_COMMUNITY): Payer: Self-pay

## 2022-08-02 ENCOUNTER — Other Ambulatory Visit: Payer: Self-pay

## 2022-08-02 MED ORDER — AMIODARONE HCL 200 MG PO TABS
ORAL_TABLET | ORAL | 1 refills | Status: DC
  Filled 2022-08-02: qty 90, 76d supply, fill #0
  Filled 2022-11-08: qty 90, 76d supply, fill #1

## 2022-08-27 ENCOUNTER — Encounter (HOSPITAL_COMMUNITY): Payer: Medicaid Other | Admitting: Cardiology

## 2022-09-06 ENCOUNTER — Other Ambulatory Visit: Payer: Self-pay | Admitting: Cardiology

## 2022-09-06 ENCOUNTER — Ambulatory Visit: Payer: Medicaid Other | Admitting: Cardiology

## 2022-09-07 ENCOUNTER — Other Ambulatory Visit (HOSPITAL_COMMUNITY): Payer: Self-pay

## 2022-09-07 ENCOUNTER — Other Ambulatory Visit: Payer: Self-pay

## 2022-09-07 MED ORDER — ENTRESTO 97-103 MG PO TABS
1.0000 | ORAL_TABLET | Freq: Two times a day (BID) | ORAL | 5 refills | Status: DC
  Filled 2022-09-07: qty 60, 30d supply, fill #0
  Filled 2022-10-12: qty 60, 30d supply, fill #1
  Filled 2022-11-08: qty 60, 30d supply, fill #2
  Filled 2022-12-13: qty 60, 30d supply, fill #3
  Filled 2023-01-17: qty 60, 30d supply, fill #4
  Filled 2023-02-21: qty 60, 30d supply, fill #5

## 2022-09-07 NOTE — Progress Notes (Signed)
Remote ICD transmission.   

## 2022-09-15 ENCOUNTER — Other Ambulatory Visit (HOSPITAL_COMMUNITY): Payer: Self-pay

## 2022-09-20 ENCOUNTER — Other Ambulatory Visit: Payer: Self-pay

## 2022-10-14 ENCOUNTER — Other Ambulatory Visit: Payer: Self-pay

## 2022-10-26 ENCOUNTER — Ambulatory Visit (INDEPENDENT_AMBULATORY_CARE_PROVIDER_SITE_OTHER): Payer: Medicaid Other

## 2022-10-26 DIAGNOSIS — I420 Dilated cardiomyopathy: Secondary | ICD-10-CM | POA: Diagnosis not present

## 2022-10-27 LAB — CUP PACEART REMOTE DEVICE CHECK
Battery Remaining Longevity: 59 mo
Battery Remaining Percentage: 68 %
Battery Voltage: 2.98 V
Brady Statistic AP VP Percent: 81 %
Brady Statistic AP VS Percent: 1 %
Brady Statistic AS VP Percent: 18 %
Brady Statistic AS VS Percent: 1 %
Brady Statistic RA Percent Paced: 81 %
Date Time Interrogation Session: 20240624220126
HighPow Impedance: 86 Ohm
Implantable Lead Connection Status: 753985
Implantable Lead Connection Status: 753985
Implantable Lead Connection Status: 753985
Implantable Lead Implant Date: 20220328
Implantable Lead Implant Date: 20220328
Implantable Lead Implant Date: 20220328
Implantable Lead Location: 753858
Implantable Lead Location: 753859
Implantable Lead Location: 753860
Implantable Pulse Generator Implant Date: 20220328
Lead Channel Impedance Value: 1225 Ohm
Lead Channel Impedance Value: 400 Ohm
Lead Channel Impedance Value: 480 Ohm
Lead Channel Pacing Threshold Amplitude: 0.75 V
Lead Channel Pacing Threshold Amplitude: 0.75 V
Lead Channel Pacing Threshold Amplitude: 1.25 V
Lead Channel Pacing Threshold Pulse Width: 0.5 ms
Lead Channel Pacing Threshold Pulse Width: 0.5 ms
Lead Channel Pacing Threshold Pulse Width: 0.5 ms
Lead Channel Sensing Intrinsic Amplitude: 1.5 mV
Lead Channel Sensing Intrinsic Amplitude: 11.9 mV
Lead Channel Setting Pacing Amplitude: 2 V
Lead Channel Setting Pacing Amplitude: 2 V
Lead Channel Setting Pacing Amplitude: 2.5 V
Lead Channel Setting Pacing Pulse Width: 0.5 ms
Lead Channel Setting Pacing Pulse Width: 0.5 ms
Lead Channel Setting Sensing Sensitivity: 0.5 mV
Pulse Gen Serial Number: 810019794
Zone Setting Status: 755011

## 2022-11-09 ENCOUNTER — Other Ambulatory Visit: Payer: Self-pay

## 2022-11-19 NOTE — Progress Notes (Signed)
Remote ICD transmission.   

## 2022-12-13 ENCOUNTER — Other Ambulatory Visit (HOSPITAL_COMMUNITY): Payer: Self-pay

## 2022-12-13 ENCOUNTER — Other Ambulatory Visit: Payer: Self-pay | Admitting: Cardiology

## 2022-12-13 ENCOUNTER — Other Ambulatory Visit: Payer: Self-pay

## 2022-12-13 MED ORDER — SPIRONOLACTONE 25 MG PO TABS
25.0000 mg | ORAL_TABLET | Freq: Every day | ORAL | 1 refills | Status: DC
  Filled 2022-12-13: qty 90, 90d supply, fill #0
  Filled 2023-02-21: qty 90, 90d supply, fill #1

## 2022-12-13 MED ORDER — CARVEDILOL 25 MG PO TABS
25.0000 mg | ORAL_TABLET | Freq: Two times a day (BID) | ORAL | 1 refills | Status: DC
  Filled 2022-12-13: qty 180, 90d supply, fill #0
  Filled 2023-04-02: qty 180, 90d supply, fill #1

## 2022-12-15 ENCOUNTER — Other Ambulatory Visit (HOSPITAL_COMMUNITY): Payer: Self-pay

## 2023-01-10 ENCOUNTER — Other Ambulatory Visit: Payer: Self-pay

## 2023-01-11 ENCOUNTER — Other Ambulatory Visit (HOSPITAL_COMMUNITY): Payer: Self-pay

## 2023-01-17 ENCOUNTER — Other Ambulatory Visit: Payer: Self-pay

## 2023-01-18 ENCOUNTER — Other Ambulatory Visit (HOSPITAL_COMMUNITY): Payer: Self-pay

## 2023-01-25 ENCOUNTER — Ambulatory Visit: Payer: Medicaid Other

## 2023-01-25 DIAGNOSIS — I420 Dilated cardiomyopathy: Secondary | ICD-10-CM

## 2023-01-25 DIAGNOSIS — I447 Left bundle-branch block, unspecified: Secondary | ICD-10-CM

## 2023-01-26 LAB — CUP PACEART REMOTE DEVICE CHECK
Battery Remaining Longevity: 56 mo
Battery Remaining Percentage: 65 %
Battery Voltage: 2.96 V
Brady Statistic AP VP Percent: 83 %
Brady Statistic AP VS Percent: 1 %
Brady Statistic AS VP Percent: 17 %
Brady Statistic AS VS Percent: 1 %
Brady Statistic RA Percent Paced: 82 %
Date Time Interrogation Session: 20240923220113
HighPow Impedance: 83 Ohm
Implantable Lead Connection Status: 753985
Implantable Lead Connection Status: 753985
Implantable Lead Connection Status: 753985
Implantable Lead Implant Date: 20220328
Implantable Lead Implant Date: 20220328
Implantable Lead Implant Date: 20220328
Implantable Lead Location: 753858
Implantable Lead Location: 753859
Implantable Lead Location: 753860
Implantable Pulse Generator Implant Date: 20220328
Lead Channel Impedance Value: 1275 Ohm
Lead Channel Impedance Value: 390 Ohm
Lead Channel Impedance Value: 480 Ohm
Lead Channel Pacing Threshold Amplitude: 0.75 V
Lead Channel Pacing Threshold Amplitude: 0.75 V
Lead Channel Pacing Threshold Amplitude: 1.25 V
Lead Channel Pacing Threshold Pulse Width: 0.5 ms
Lead Channel Pacing Threshold Pulse Width: 0.5 ms
Lead Channel Pacing Threshold Pulse Width: 0.5 ms
Lead Channel Sensing Intrinsic Amplitude: 1 mV
Lead Channel Sensing Intrinsic Amplitude: 12 mV
Lead Channel Setting Pacing Amplitude: 2 V
Lead Channel Setting Pacing Amplitude: 2 V
Lead Channel Setting Pacing Amplitude: 2.5 V
Lead Channel Setting Pacing Pulse Width: 0.5 ms
Lead Channel Setting Pacing Pulse Width: 0.5 ms
Lead Channel Setting Sensing Sensitivity: 0.5 mV
Pulse Gen Serial Number: 810019794
Zone Setting Status: 755011

## 2023-02-09 ENCOUNTER — Other Ambulatory Visit: Payer: Self-pay | Admitting: Cardiology

## 2023-02-09 ENCOUNTER — Other Ambulatory Visit (HOSPITAL_COMMUNITY): Payer: Self-pay

## 2023-02-09 MED ORDER — AMIODARONE HCL 200 MG PO TABS
ORAL_TABLET | ORAL | 1 refills | Status: DC
  Filled 2023-02-09: qty 90, 76d supply, fill #0
  Filled 2023-04-30: qty 90, 76d supply, fill #1

## 2023-02-11 NOTE — Progress Notes (Signed)
Remote ICD transmission.   

## 2023-02-21 ENCOUNTER — Other Ambulatory Visit: Payer: Self-pay

## 2023-02-22 ENCOUNTER — Other Ambulatory Visit (HOSPITAL_COMMUNITY): Payer: Self-pay

## 2023-04-02 ENCOUNTER — Other Ambulatory Visit: Payer: Self-pay | Admitting: Cardiology

## 2023-04-05 ENCOUNTER — Other Ambulatory Visit (HOSPITAL_COMMUNITY): Payer: Self-pay

## 2023-04-05 MED ORDER — ENTRESTO 97-103 MG PO TABS
1.0000 | ORAL_TABLET | Freq: Two times a day (BID) | ORAL | 5 refills | Status: DC
  Filled 2023-04-05: qty 60, 30d supply, fill #0
  Filled 2023-04-30: qty 60, 30d supply, fill #1
  Filled 2023-06-05: qty 60, 30d supply, fill #2
  Filled 2023-07-18: qty 60, 30d supply, fill #3
  Filled 2023-08-16: qty 60, 30d supply, fill #4
  Filled 2023-09-19: qty 60, 30d supply, fill #5

## 2023-04-22 ENCOUNTER — Other Ambulatory Visit: Payer: Self-pay

## 2023-04-26 ENCOUNTER — Ambulatory Visit (INDEPENDENT_AMBULATORY_CARE_PROVIDER_SITE_OTHER): Payer: Medicaid Other

## 2023-04-26 DIAGNOSIS — I447 Left bundle-branch block, unspecified: Secondary | ICD-10-CM

## 2023-04-26 DIAGNOSIS — I420 Dilated cardiomyopathy: Secondary | ICD-10-CM

## 2023-04-26 LAB — CUP PACEART REMOTE DEVICE CHECK
Battery Remaining Longevity: 54 mo
Battery Remaining Percentage: 63 %
Battery Voltage: 2.96 V
Brady Statistic AP VP Percent: 84 %
Brady Statistic AP VS Percent: 1 %
Brady Statistic AS VP Percent: 16 %
Brady Statistic AS VS Percent: 1 %
Brady Statistic RA Percent Paced: 83 %
Date Time Interrogation Session: 20241223210352
HighPow Impedance: 83 Ohm
Implantable Lead Connection Status: 753985
Implantable Lead Connection Status: 753985
Implantable Lead Connection Status: 753985
Implantable Lead Implant Date: 20220328
Implantable Lead Implant Date: 20220328
Implantable Lead Implant Date: 20220328
Implantable Lead Location: 753858
Implantable Lead Location: 753859
Implantable Lead Location: 753860
Implantable Pulse Generator Implant Date: 20220328
Lead Channel Impedance Value: 1225 Ohm
Lead Channel Impedance Value: 410 Ohm
Lead Channel Impedance Value: 480 Ohm
Lead Channel Pacing Threshold Amplitude: 0.75 V
Lead Channel Pacing Threshold Amplitude: 0.75 V
Lead Channel Pacing Threshold Amplitude: 1.25 V
Lead Channel Pacing Threshold Pulse Width: 0.5 ms
Lead Channel Pacing Threshold Pulse Width: 0.5 ms
Lead Channel Pacing Threshold Pulse Width: 0.5 ms
Lead Channel Sensing Intrinsic Amplitude: 1.4 mV
Lead Channel Sensing Intrinsic Amplitude: 12 mV
Lead Channel Setting Pacing Amplitude: 2 V
Lead Channel Setting Pacing Amplitude: 2 V
Lead Channel Setting Pacing Amplitude: 2.5 V
Lead Channel Setting Pacing Pulse Width: 0.5 ms
Lead Channel Setting Pacing Pulse Width: 0.5 ms
Lead Channel Setting Sensing Sensitivity: 0.5 mV
Pulse Gen Serial Number: 810019794
Zone Setting Status: 755011

## 2023-04-28 ENCOUNTER — Other Ambulatory Visit (HOSPITAL_COMMUNITY): Payer: Self-pay

## 2023-05-02 ENCOUNTER — Other Ambulatory Visit: Payer: Self-pay

## 2023-05-05 ENCOUNTER — Other Ambulatory Visit (HOSPITAL_COMMUNITY): Payer: Self-pay

## 2023-06-05 ENCOUNTER — Other Ambulatory Visit: Payer: Self-pay | Admitting: Cardiology

## 2023-06-06 ENCOUNTER — Other Ambulatory Visit: Payer: Self-pay

## 2023-06-07 ENCOUNTER — Other Ambulatory Visit (HOSPITAL_COMMUNITY): Payer: Self-pay

## 2023-06-07 MED ORDER — SPIRONOLACTONE 25 MG PO TABS
25.0000 mg | ORAL_TABLET | Freq: Every day | ORAL | 0 refills | Status: DC
  Filled 2023-06-07: qty 15, 15d supply, fill #0

## 2023-06-08 NOTE — Progress Notes (Signed)
 Remote ICD transmission.

## 2023-06-21 ENCOUNTER — Other Ambulatory Visit: Payer: Self-pay | Admitting: Cardiology

## 2023-06-29 ENCOUNTER — Other Ambulatory Visit: Payer: Self-pay | Admitting: Cardiology

## 2023-07-05 ENCOUNTER — Other Ambulatory Visit: Payer: Self-pay | Admitting: Cardiology

## 2023-07-06 ENCOUNTER — Other Ambulatory Visit (HOSPITAL_COMMUNITY): Payer: Self-pay

## 2023-07-06 MED ORDER — SPIRONOLACTONE 25 MG PO TABS
25.0000 mg | ORAL_TABLET | Freq: Every day | ORAL | 0 refills | Status: DC
  Filled 2023-07-06: qty 15, 15d supply, fill #0

## 2023-07-18 ENCOUNTER — Other Ambulatory Visit: Payer: Self-pay | Admitting: Cardiology

## 2023-07-19 ENCOUNTER — Other Ambulatory Visit (HOSPITAL_COMMUNITY): Payer: Self-pay

## 2023-07-19 ENCOUNTER — Other Ambulatory Visit: Payer: Self-pay

## 2023-07-19 MED ORDER — AMIODARONE HCL 200 MG PO TABS
200.0000 mg | ORAL_TABLET | Freq: Every day | ORAL | 0 refills | Status: DC
  Filled 2023-07-19: qty 15, 15d supply, fill #0

## 2023-07-20 ENCOUNTER — Other Ambulatory Visit: Payer: Self-pay

## 2023-07-20 ENCOUNTER — Other Ambulatory Visit (HOSPITAL_COMMUNITY): Payer: Self-pay

## 2023-07-20 MED ORDER — CARVEDILOL 25 MG PO TABS
25.0000 mg | ORAL_TABLET | Freq: Two times a day (BID) | ORAL | 0 refills | Status: DC
  Filled 2023-07-20: qty 60, 30d supply, fill #0

## 2023-07-21 ENCOUNTER — Other Ambulatory Visit: Payer: Self-pay | Admitting: Cardiology

## 2023-07-22 ENCOUNTER — Other Ambulatory Visit: Payer: Self-pay | Admitting: Cardiology

## 2023-07-26 ENCOUNTER — Ambulatory Visit (INDEPENDENT_AMBULATORY_CARE_PROVIDER_SITE_OTHER): Payer: Medicaid Other

## 2023-07-26 ENCOUNTER — Telehealth: Payer: Self-pay | Admitting: Cardiology

## 2023-07-26 ENCOUNTER — Other Ambulatory Visit (HOSPITAL_COMMUNITY): Payer: Self-pay

## 2023-07-26 DIAGNOSIS — I447 Left bundle-branch block, unspecified: Secondary | ICD-10-CM | POA: Diagnosis not present

## 2023-07-26 LAB — CUP PACEART REMOTE DEVICE CHECK
Battery Remaining Longevity: 52 mo
Battery Remaining Percentage: 59 %
Battery Voltage: 2.96 V
Brady Statistic AP VP Percent: 84 %
Brady Statistic AP VS Percent: 1 %
Brady Statistic AS VP Percent: 15 %
Brady Statistic AS VS Percent: 1 %
Brady Statistic RA Percent Paced: 83 %
Date Time Interrogation Session: 20250324220300
HighPow Impedance: 82 Ohm
Implantable Lead Connection Status: 753985
Implantable Lead Connection Status: 753985
Implantable Lead Connection Status: 753985
Implantable Lead Implant Date: 20220328
Implantable Lead Implant Date: 20220328
Implantable Lead Implant Date: 20220328
Implantable Lead Location: 753858
Implantable Lead Location: 753859
Implantable Lead Location: 753860
Implantable Pulse Generator Implant Date: 20220328
Lead Channel Impedance Value: 1250 Ohm
Lead Channel Impedance Value: 410 Ohm
Lead Channel Impedance Value: 510 Ohm
Lead Channel Pacing Threshold Amplitude: 0.75 V
Lead Channel Pacing Threshold Amplitude: 0.75 V
Lead Channel Pacing Threshold Amplitude: 1.25 V
Lead Channel Pacing Threshold Pulse Width: 0.5 ms
Lead Channel Pacing Threshold Pulse Width: 0.5 ms
Lead Channel Pacing Threshold Pulse Width: 0.5 ms
Lead Channel Sensing Intrinsic Amplitude: 1.2 mV
Lead Channel Sensing Intrinsic Amplitude: 12 mV
Lead Channel Setting Pacing Amplitude: 2 V
Lead Channel Setting Pacing Amplitude: 2 V
Lead Channel Setting Pacing Amplitude: 2.5 V
Lead Channel Setting Pacing Pulse Width: 0.5 ms
Lead Channel Setting Pacing Pulse Width: 0.5 ms
Lead Channel Setting Sensing Sensitivity: 0.5 mV
Pulse Gen Serial Number: 810019794
Zone Setting Status: 755011

## 2023-07-26 MED ORDER — SPIRONOLACTONE 25 MG PO TABS
25.0000 mg | ORAL_TABLET | Freq: Every day | ORAL | 0 refills | Status: DC
  Filled 2023-07-26: qty 15, 15d supply, fill #0

## 2023-07-26 NOTE — Telephone Encounter (Signed)
*  STAT* If patient is at the pharmacy, call can be transferred to refill team.   1. Which medications need to be refilled? (please list name of each medication and dose if known) spironolactone (ALDACTONE) 25 MG tablet   2. Which pharmacy/location (including street and city if local pharmacy) is medication to be sent to?  [place] [facility]    3. Do they need a 30 day or 90 day supply? 90   Patient has appt on 5/23

## 2023-07-30 ENCOUNTER — Encounter: Payer: Self-pay | Admitting: Cardiology

## 2023-08-08 ENCOUNTER — Other Ambulatory Visit (HOSPITAL_COMMUNITY): Payer: Self-pay

## 2023-08-08 ENCOUNTER — Other Ambulatory Visit: Payer: Self-pay | Admitting: Cardiology

## 2023-08-08 MED ORDER — SPIRONOLACTONE 25 MG PO TABS
25.0000 mg | ORAL_TABLET | Freq: Every day | ORAL | 1 refills | Status: DC
  Filled 2023-08-08: qty 30, 30d supply, fill #0
  Filled 2023-09-06: qty 30, 30d supply, fill #1

## 2023-08-16 ENCOUNTER — Other Ambulatory Visit: Payer: Self-pay | Admitting: Cardiology

## 2023-08-17 ENCOUNTER — Other Ambulatory Visit (HOSPITAL_COMMUNITY): Payer: Self-pay

## 2023-08-17 MED ORDER — AMIODARONE HCL 200 MG PO TABS
200.0000 mg | ORAL_TABLET | Freq: Every day | ORAL | 0 refills | Status: DC
  Filled 2023-08-17: qty 90, 90d supply, fill #0

## 2023-08-17 MED ORDER — MEXILETINE HCL 150 MG PO CAPS
150.0000 mg | ORAL_CAPSULE | Freq: Two times a day (BID) | ORAL | 0 refills | Status: DC
  Filled 2023-08-17: qty 180, 90d supply, fill #0

## 2023-08-17 MED ORDER — MEXILETINE HCL 150 MG PO CAPS
150.0000 mg | ORAL_CAPSULE | Freq: Two times a day (BID) | ORAL | 0 refills | Status: DC
  Filled 2023-08-17: qty 60, 30d supply, fill #0

## 2023-08-17 NOTE — Addendum Note (Signed)
 Addended by: Debrah Fan, Zykee Avakian L on: 08/17/2023 11:54 AM   Modules accepted: Orders

## 2023-08-18 ENCOUNTER — Other Ambulatory Visit: Payer: Self-pay

## 2023-08-31 ENCOUNTER — Other Ambulatory Visit (HOSPITAL_COMMUNITY): Payer: Self-pay

## 2023-08-31 ENCOUNTER — Other Ambulatory Visit: Payer: Self-pay | Admitting: Cardiology

## 2023-08-31 MED ORDER — CARVEDILOL 25 MG PO TABS
25.0000 mg | ORAL_TABLET | Freq: Two times a day (BID) | ORAL | 0 refills | Status: DC
  Filled 2023-08-31: qty 60, 30d supply, fill #0

## 2023-09-06 ENCOUNTER — Other Ambulatory Visit: Payer: Self-pay

## 2023-09-09 NOTE — Progress Notes (Signed)
 Remote ICD transmission.

## 2023-09-11 ENCOUNTER — Other Ambulatory Visit: Payer: Self-pay | Admitting: Cardiology

## 2023-09-12 ENCOUNTER — Other Ambulatory Visit (HOSPITAL_COMMUNITY): Payer: Self-pay

## 2023-09-20 ENCOUNTER — Other Ambulatory Visit: Payer: Self-pay

## 2023-09-21 NOTE — Progress Notes (Deleted)
  Cardiology Office Note:   Date:  09/21/2023  ID:  MASSAI HANKERSON, DOB 1965/12/16, MRN [id number] PCP: Patient, No Pcp Per  [facility] Providers Cardiologist:  Eilleen Grates, MD Electrophysiologist:  Boyce Byes, MD {  History of Present Illness:   DURREL MCNEE is a 58 y.o. 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  His EF had been 20% in 2008 but improved with medical management. Unfortunately he eventually stopped his meds and his EF went from a high of 55% to 25% in 2015 and after med titration only went up to 30% in 2016.  However, in August his EF was 20 - 25%.   I have had a difficult time titrating his meds because of hypotension.  I did send him for a CPX recently.  He had a submax effort.  Peak VO2 was 17.5.     He is status post BiV ICD. He had increased PVCs recently and was started on mexileitine.  He has canceled his last two appts.   I last saw him in 2023.   ***   ***   ***  He says that he has been doing well since he was last seen.  His palpitations happen sporadically but they are much better than they used to be. The patient denies any new symptoms such as chest discomfort, neck or arm discomfort. There has been no new shortness of breath, PND or orthopnea. There have been no reported palpitations, presyncope or syncope.  He does activities such as pushing a lawnmower.  He does fatigue.  ROS: ***  Studies Reviewed:    EKG:       ***  Risk Assessment/Calculations:   {Does this patient have ATRIAL FIBRILLATION?:[phone]} No BP recorded.  {Refresh Note OR Click here to enter BP  :1}***        Physical Exam:   VS:  There were no vitals taken for this visit.   Wt Readings from Last 3 Encounters:  04/12/22 242 lb 6.4 oz (110 kg)  01/06/22 239 lb 12.8 oz (108.8 kg)  10/02/21 242 lb 12.8 oz (110.1 kg)     GEN: Well nourished, well developed in no acute distress NECK: No JVD; No carotid bruits CARDIAC:  ***RR, *** murmurs, rubs, gallops RESPIRATORY:  Clear to auscultation without rales, wheezing or rhonchi  ABDOMEN: Soft, non-tender, non-distended EXTREMITIES:  No edema; No deformity   ASSESSMENT AND PLAN:   CHRONIC SYSTOLIC HF:   ***   He seems to be euvolemic.  He did not want to consider [place] with cost but he is tolerating other medications.  The blood pressure will not allow med titration.  No change in therapy.   PVCs:   ***  He is now status post CRT-D and on optimal medical therapy that his blood pressure will allow.   HTN:    This is ***  being managed in the context of treating his CHF   TOBACCO ABUSE:  ***  He is unable to quit smoking and we talked about this again today.   SLEEP APNEA:    ***  He is using CPAP.    BiVICD:  He is up to date with follow up in March 2025.  ***      Follow up ***  Signed, Eilleen Grates, MD

## 2023-09-23 ENCOUNTER — Other Ambulatory Visit: Payer: Self-pay | Admitting: Cardiology

## 2023-09-23 ENCOUNTER — Ambulatory Visit: Admitting: Cardiology

## 2023-09-23 ENCOUNTER — Other Ambulatory Visit (HOSPITAL_COMMUNITY): Payer: Self-pay

## 2023-09-23 ENCOUNTER — Other Ambulatory Visit (HOSPITAL_BASED_OUTPATIENT_CLINIC_OR_DEPARTMENT_OTHER): Payer: Self-pay

## 2023-09-23 MED ORDER — MEXILETINE HCL 150 MG PO CAPS
150.0000 mg | ORAL_CAPSULE | Freq: Two times a day (BID) | ORAL | 0 refills | Status: DC
  Filled 2023-09-23: qty 30, 15d supply, fill #0

## 2023-09-29 ENCOUNTER — Other Ambulatory Visit: Payer: Self-pay | Admitting: Cardiology

## 2023-09-29 ENCOUNTER — Other Ambulatory Visit (HOSPITAL_COMMUNITY): Payer: Self-pay

## 2023-09-29 MED ORDER — CARVEDILOL 25 MG PO TABS
25.0000 mg | ORAL_TABLET | Freq: Two times a day (BID) | ORAL | 0 refills | Status: DC
  Filled 2023-09-29: qty 60, 30d supply, fill #0

## 2023-10-05 ENCOUNTER — Other Ambulatory Visit: Payer: Self-pay | Admitting: Cardiology

## 2023-10-06 ENCOUNTER — Other Ambulatory Visit (HOSPITAL_COMMUNITY): Payer: Self-pay

## 2023-10-06 MED ORDER — SPIRONOLACTONE 25 MG PO TABS
25.0000 mg | ORAL_TABLET | Freq: Every day | ORAL | 1 refills | Status: DC
  Filled 2023-10-06: qty 30, 30d supply, fill #0
  Filled 2023-10-31: qty 30, 30d supply, fill #1

## 2023-10-18 ENCOUNTER — Other Ambulatory Visit: Payer: Self-pay | Admitting: Cardiology

## 2023-10-19 NOTE — Progress Notes (Signed)
 Cardiology Office Note:   Date:  10/20/2023  ID:  Cody Carpenter, DOB 1965-12-03, MRN [id number] PCP: Patient, No Pcp Per  [facility] Providers Cardiologist:  Lynwood Schilling, MD Electrophysiologist:  OLE ONEIDA HOLTS, MD {  History of Present Illness:   Cody Carpenter is a 58 y.o. 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  His EF had been 20% in 2008 but improved with medical management. Unfortunately he eventually stopped his meds and his EF went from a high of 55% to 25% in 2015 and after med titration only went up to 30% in 2016.  However, in August his EF was 20 - 25%.   I have had a difficult time titrating his meds because of hypotension.  I did send him for a CPX recently.  He had a submax effort.  Peak VO2 was 17.5.     He is status post BiV ICD. He had increased PVCs recently and was started on mexileitine he is currently on amiodarone  as well.   His last echocardiogram in January 2024 demonstrated EF 20 to 25%.  Since I last saw him he has done well.  The patient denies any new symptoms such as chest discomfort, neck or arm discomfort. There has been no new shortness of breath, PND or orthopnea. There have been no reported palpitations, presyncope or syncope.   He does do some activities such as driving in his yard and does not have any overt symptoms.   ROS:  As stated in the HPI and negative for all other systems.  Studies Reviewed:    EKG:   EKG Interpretation Date/Time:  Thursday October 20 2023 09:12:03 EDT Ventricular Rate:  70 PR Interval:  178 QRS Duration:  148 QT Interval:  484 QTC Calculation: 522 R Axis:   222  Text Interpretation: AV dual-paced rhythm Biventricular pacemaker detected When compared with ECG of 15-Jun-2018 14:10, Electronic ventricular pacemaker has replaced Sinus rhythm Confirmed by Schilling Lynwood ([id number]) on 10/20/2023 9:44:42 AM    Risk Assessment/Calculations:              Physical Exam:    VS:  BP 102/74   Pulse 69   Ht 5' 8 (1.727 m)   Wt 233 lb (105.7 kg)   SpO2 96%   BMI 35.43 kg/m    Wt Readings from Last 3 Encounters:  10/20/23 233 lb (105.7 kg)  04/12/22 242 lb 6.4 oz (110 kg)  01/06/22 239 lb 12.8 oz (108.8 kg)     GEN: Well nourished, well developed in no acute distress NECK: No JVD; No carotid bruits CARDIAC: RRR, no murmurs, rubs, gallops RESPIRATORY:  Clear to auscultation without rales, wheezing or rhonchi  ABDOMEN: Soft, non-tender, non-distended EXTREMITIES:  No edema; No deformity   ASSESSMENT AND PLAN:   CHRONIC SYSTOLIC HF:   He is euvolemic.  He is tolerating the meds as listed.  He really has not wanted a lot of med titration as blood pressure would not allow.  Doreen has been cost prohibitive.  No change in therapy.   PVCs:   He is status post CRT-D.  He is on the amiodarone  and mexiletine.  I will follow-up with liver enzymes and a TSH today as well as metabolic profile and lipids.  He will also get a CBC.    HTN:    This is being managed in the context of treating his cardiomyopathy.    TOBACCO ABUSE: He  is unable to quit smoking and we talked about this multiple times.   SLEEP APNEA:    He uses his CPAP.  No change in therapy.  BiVICD:  He is up to date with follow up in March 2025.        Follow up with me in one year.   Signed, Lynwood Schilling, MD

## 2023-10-20 ENCOUNTER — Other Ambulatory Visit (HOSPITAL_COMMUNITY): Payer: Self-pay

## 2023-10-20 ENCOUNTER — Ambulatory Visit: Attending: Cardiology | Admitting: Cardiology

## 2023-10-20 ENCOUNTER — Encounter: Payer: Self-pay | Admitting: Cardiology

## 2023-10-20 ENCOUNTER — Other Ambulatory Visit: Payer: Self-pay | Admitting: Cardiology

## 2023-10-20 VITALS — BP 102/74 | HR 69 | Ht 68.0 in | Wt 233.0 lb

## 2023-10-20 DIAGNOSIS — I5022 Chronic systolic (congestive) heart failure: Secondary | ICD-10-CM | POA: Diagnosis not present

## 2023-10-20 DIAGNOSIS — I420 Dilated cardiomyopathy: Secondary | ICD-10-CM | POA: Insufficient documentation

## 2023-10-20 DIAGNOSIS — R002 Palpitations: Secondary | ICD-10-CM | POA: Diagnosis present

## 2023-10-20 DIAGNOSIS — I493 Ventricular premature depolarization: Secondary | ICD-10-CM | POA: Insufficient documentation

## 2023-10-20 DIAGNOSIS — I10 Essential (primary) hypertension: Secondary | ICD-10-CM | POA: Diagnosis present

## 2023-10-20 LAB — CBC

## 2023-10-20 LAB — LIPID PANEL

## 2023-10-20 NOTE — Patient Instructions (Signed)
 Medication Instructions:  Your physician recommends that you continue on your current medications as directed. Please refer to the Current Medication list given to you today.  *If you need a refill on your cardiac medications before your next appointment, please call your pharmacy*  Lab Work: CBC, CMET, Lipids, TSH If you have labs (blood work) drawn today and your tests are completely normal, you will receive your results only by: MyChart Message (if you have MyChart) OR A paper copy in the mail If you have any lab test that is abnormal or we need to change your treatment, we will call you to review the results.  Testing/Procedures: NONE  Follow-Up: At [facility], you and your health needs are our priority.  As part of our continuing mission to provide you with exceptional heart care, our providers are all part of one team.  This team includes your primary Cardiologist (physician) and Advanced Practice Providers or APPs (Physician Assistants and Nurse Practitioners) who all work together to provide you with the care you need, when you need it.  Your next appointment:   1 year  Provider:   Lavonne Prairie, MD  We recommend signing up for the patient portal called MyChart.  Sign up information is provided on this After Visit Summary.  MyChart is used to connect with patients for Virtual Visits (Telemedicine).  Patients are able to view lab/test results, encounter notes, upcoming appointments, etc.  Non-urgent messages can be sent to your provider as well.   To learn more about what you can do with MyChart, go to [email or web address].

## 2023-10-21 ENCOUNTER — Other Ambulatory Visit (HOSPITAL_COMMUNITY): Payer: Self-pay

## 2023-10-21 ENCOUNTER — Ambulatory Visit: Payer: Self-pay | Admitting: Cardiology

## 2023-10-21 LAB — CBC
Hematocrit: 44.9 % (ref 37.5–51.0)
Hemoglobin: 14.3 g/dL (ref 13.0–17.7)
MCH: 30.4 pg (ref 26.6–33.0)
MCHC: 31.8 g/dL (ref 31.5–35.7)
MCV: 95 fL (ref 79–97)
Platelets: 193 10*3/uL (ref 150–450)
RBC: 4.71 x10E6/uL (ref 4.14–5.80)
RDW: 14.1 % (ref 11.6–15.4)
WBC: 6.6 10*3/uL (ref 3.4–10.8)

## 2023-10-21 LAB — COMPREHENSIVE METABOLIC PANEL WITH GFR
ALT: 21 IU/L (ref 0–44)
AST: 19 IU/L (ref 0–40)
Albumin: 4.2 g/dL (ref 3.8–4.9)
Alkaline Phosphatase: 84 IU/L (ref 44–121)
BUN/Creatinine Ratio: 21 — ABNORMAL HIGH (ref 9–20)
BUN: 22 mg/dL (ref 6–24)
Bilirubin Total: 0.5 mg/dL (ref 0.0–1.2)
CO2: 19 mmol/L — ABNORMAL LOW (ref 20–29)
Calcium: 9.2 mg/dL (ref 8.7–10.2)
Chloride: 104 mmol/L (ref 96–106)
Creatinine, Ser: 1.05 mg/dL (ref 0.76–1.27)
Globulin, Total: 2.9 g/dL (ref 1.5–4.5)
Glucose: 92 mg/dL (ref 70–99)
Potassium: 4.9 mmol/L (ref 3.5–5.2)
Sodium: 138 mmol/L (ref 134–144)
Total Protein: 7.1 g/dL (ref 6.0–8.5)
eGFR: 83 mL/min/{1.73_m2} (ref >59)

## 2023-10-21 LAB — TSH: TSH: 2.33 u[IU]/mL (ref 0.450–4.500)

## 2023-10-21 LAB — LIPID PANEL
Chol/HDL Ratio: 6.1 ratio — ABNORMAL HIGH (ref 0.0–5.0)
Cholesterol, Total: 190 mg/dL (ref 100–199)
HDL: 31 mg/dL — ABNORMAL LOW (ref >39)
LDL Chol Calc (NIH): 124 mg/dL — ABNORMAL HIGH (ref 0–99)
Triglycerides: 194 mg/dL — ABNORMAL HIGH (ref 0–149)
VLDL Cholesterol Cal: 35 mg/dL (ref 5–40)

## 2023-10-22 ENCOUNTER — Other Ambulatory Visit (HOSPITAL_COMMUNITY): Payer: Self-pay

## 2023-10-24 ENCOUNTER — Other Ambulatory Visit (HOSPITAL_COMMUNITY): Payer: Self-pay

## 2023-10-24 MED ORDER — MEXILETINE HCL 150 MG PO CAPS
150.0000 mg | ORAL_CAPSULE | Freq: Two times a day (BID) | ORAL | 3 refills | Status: AC
  Filled 2023-10-24: qty 180, 90d supply, fill #0
  Filled 2024-01-18: qty 180, 90d supply, fill #1
  Filled 2024-05-14: qty 180, 90d supply, fill #2

## 2023-10-25 ENCOUNTER — Ambulatory Visit (INDEPENDENT_AMBULATORY_CARE_PROVIDER_SITE_OTHER): Payer: Medicaid Other

## 2023-10-25 DIAGNOSIS — I5022 Chronic systolic (congestive) heart failure: Secondary | ICD-10-CM

## 2023-10-25 LAB — CUP PACEART REMOTE DEVICE CHECK
Battery Remaining Longevity: 48 mo
Battery Remaining Percentage: 57 %
Battery Voltage: 2.96 V
Brady Statistic AP VP Percent: 84 %
Brady Statistic AP VS Percent: 1 %
Brady Statistic AS VP Percent: 15 %
Brady Statistic AS VS Percent: 1 %
Brady Statistic RA Percent Paced: 83 %
Date Time Interrogation Session: 20250624020431
HighPow Impedance: 86 Ohm
Implantable Lead Connection Status: 753985
Implantable Lead Connection Status: 753985
Implantable Lead Connection Status: 753985
Implantable Lead Implant Date: 20220328
Implantable Lead Implant Date: 20220328
Implantable Lead Implant Date: 20220328
Implantable Lead Location: 753858
Implantable Lead Location: 753859
Implantable Lead Location: 753860
Implantable Pulse Generator Implant Date: 20220328
Lead Channel Impedance Value: 1225 Ohm
Lead Channel Impedance Value: 400 Ohm
Lead Channel Impedance Value: 450 Ohm
Lead Channel Pacing Threshold Amplitude: 0.75 V
Lead Channel Pacing Threshold Amplitude: 0.75 V
Lead Channel Pacing Threshold Amplitude: 1.25 V
Lead Channel Pacing Threshold Pulse Width: 0.5 ms
Lead Channel Pacing Threshold Pulse Width: 0.5 ms
Lead Channel Pacing Threshold Pulse Width: 0.5 ms
Lead Channel Sensing Intrinsic Amplitude: 11.9 mV
Lead Channel Sensing Intrinsic Amplitude: 2.4 mV
Lead Channel Setting Pacing Amplitude: 2 V
Lead Channel Setting Pacing Amplitude: 2 V
Lead Channel Setting Pacing Amplitude: 2.5 V
Lead Channel Setting Pacing Pulse Width: 0.5 ms
Lead Channel Setting Pacing Pulse Width: 0.5 ms
Lead Channel Setting Sensing Sensitivity: 0.5 mV
Pulse Gen Serial Number: 810019794
Zone Setting Status: 755011

## 2023-10-27 ENCOUNTER — Ambulatory Visit: Payer: Self-pay | Admitting: Cardiology

## 2023-10-31 ENCOUNTER — Other Ambulatory Visit: Payer: Self-pay | Admitting: Cardiology

## 2023-11-01 ENCOUNTER — Other Ambulatory Visit (HOSPITAL_COMMUNITY): Payer: Self-pay

## 2023-11-01 MED ORDER — ENTRESTO 97-103 MG PO TABS
1.0000 | ORAL_TABLET | Freq: Two times a day (BID) | ORAL | 5 refills | Status: DC
  Filled 2023-11-01: qty 60, 30d supply, fill #0
  Filled 2023-12-06: qty 60, 30d supply, fill #1
  Filled 2024-01-03: qty 60, 30d supply, fill #2
  Filled 2024-02-21: qty 60, 30d supply, fill #3
  Filled 2024-03-26: qty 60, 30d supply, fill #4
  Filled 2024-04-23: qty 60, 30d supply, fill #5

## 2023-11-01 MED ORDER — CARVEDILOL 25 MG PO TABS
25.0000 mg | ORAL_TABLET | Freq: Two times a day (BID) | ORAL | 8 refills | Status: AC
  Filled 2023-11-01: qty 60, 30d supply, fill #0
  Filled 2023-12-02: qty 60, 30d supply, fill #1
  Filled 2024-01-01: qty 60, 30d supply, fill #2
  Filled 2024-02-29: qty 60, 30d supply, fill #3
  Filled 2024-03-26: qty 60, 30d supply, fill #4
  Filled 2024-05-18: qty 60, 30d supply, fill #5

## 2023-11-10 ENCOUNTER — Other Ambulatory Visit (HOSPITAL_COMMUNITY): Payer: Self-pay

## 2023-11-10 ENCOUNTER — Other Ambulatory Visit: Payer: Self-pay | Admitting: Cardiology

## 2023-11-10 MED ORDER — AMIODARONE HCL 200 MG PO TABS
200.0000 mg | ORAL_TABLET | Freq: Every day | ORAL | 0 refills | Status: DC
  Filled 2023-11-10: qty 15, 15d supply, fill #0

## 2023-11-29 ENCOUNTER — Other Ambulatory Visit: Payer: Self-pay | Admitting: Cardiology

## 2023-12-01 ENCOUNTER — Other Ambulatory Visit (HOSPITAL_BASED_OUTPATIENT_CLINIC_OR_DEPARTMENT_OTHER): Payer: Self-pay

## 2023-12-01 ENCOUNTER — Other Ambulatory Visit (HOSPITAL_COMMUNITY): Payer: Self-pay

## 2023-12-01 MED ORDER — SPIRONOLACTONE 25 MG PO TABS
25.0000 mg | ORAL_TABLET | Freq: Every day | ORAL | 3 refills | Status: AC
  Filled 2023-12-01: qty 90, 90d supply, fill #0
  Filled 2024-02-29: qty 90, 90d supply, fill #1
  Filled 2024-05-22: qty 90, 90d supply, fill #2

## 2023-12-06 ENCOUNTER — Other Ambulatory Visit: Payer: Self-pay | Admitting: Cardiology

## 2023-12-08 ENCOUNTER — Other Ambulatory Visit: Payer: Self-pay

## 2023-12-08 ENCOUNTER — Other Ambulatory Visit (HOSPITAL_COMMUNITY): Payer: Self-pay

## 2023-12-08 MED ORDER — AMIODARONE HCL 200 MG PO TABS
200.0000 mg | ORAL_TABLET | Freq: Every day | ORAL | 0 refills | Status: DC
  Filled 2023-12-08: qty 30, 30d supply, fill #0

## 2024-01-01 ENCOUNTER — Other Ambulatory Visit: Payer: Self-pay

## 2024-01-01 ENCOUNTER — Other Ambulatory Visit: Payer: Self-pay | Admitting: Cardiology

## 2024-01-03 ENCOUNTER — Other Ambulatory Visit (HOSPITAL_COMMUNITY): Payer: Self-pay

## 2024-01-03 MED ORDER — AMIODARONE HCL 200 MG PO TABS
200.0000 mg | ORAL_TABLET | Freq: Every day | ORAL | 0 refills | Status: DC
  Filled 2024-01-03: qty 15, 15d supply, fill #0

## 2024-01-16 ENCOUNTER — Other Ambulatory Visit: Payer: Self-pay | Admitting: Cardiology

## 2024-01-19 ENCOUNTER — Other Ambulatory Visit (HOSPITAL_COMMUNITY): Payer: Self-pay

## 2024-01-21 ENCOUNTER — Other Ambulatory Visit (HOSPITAL_COMMUNITY): Payer: Self-pay

## 2024-01-23 ENCOUNTER — Other Ambulatory Visit (HOSPITAL_COMMUNITY): Payer: Self-pay

## 2024-01-23 ENCOUNTER — Other Ambulatory Visit: Payer: Self-pay | Admitting: Cardiology

## 2024-01-24 ENCOUNTER — Ambulatory Visit (INDEPENDENT_AMBULATORY_CARE_PROVIDER_SITE_OTHER): Payer: Medicaid Other

## 2024-01-24 ENCOUNTER — Telehealth: Payer: Self-pay | Admitting: Cardiology

## 2024-01-24 ENCOUNTER — Other Ambulatory Visit: Payer: Self-pay | Admitting: Cardiology

## 2024-01-24 ENCOUNTER — Other Ambulatory Visit (HOSPITAL_COMMUNITY): Payer: Self-pay

## 2024-01-24 DIAGNOSIS — I5022 Chronic systolic (congestive) heart failure: Secondary | ICD-10-CM

## 2024-01-24 LAB — CUP PACEART REMOTE DEVICE CHECK
Battery Remaining Longevity: 46 mo
Battery Remaining Percentage: 54 %
Battery Voltage: 2.96 V
Brady Statistic AP VP Percent: 84 %
Brady Statistic AP VS Percent: 1 %
Brady Statistic AS VP Percent: 14 %
Brady Statistic AS VS Percent: 1 %
Brady Statistic RA Percent Paced: 84 %
Date Time Interrogation Session: 20250923021827
HighPow Impedance: 82 Ohm
Implantable Lead Connection Status: 753985
Implantable Lead Connection Status: 753985
Implantable Lead Connection Status: 753985
Implantable Lead Implant Date: 20220328
Implantable Lead Implant Date: 20220328
Implantable Lead Implant Date: 20220328
Implantable Lead Location: 753858
Implantable Lead Location: 753859
Implantable Lead Location: 753860
Implantable Pulse Generator Implant Date: 20220328
Lead Channel Impedance Value: 1125 Ohm
Lead Channel Impedance Value: 400 Ohm
Lead Channel Impedance Value: 410 Ohm
Lead Channel Pacing Threshold Amplitude: 0.75 V
Lead Channel Pacing Threshold Amplitude: 0.75 V
Lead Channel Pacing Threshold Amplitude: 1.25 V
Lead Channel Pacing Threshold Pulse Width: 0.5 ms
Lead Channel Pacing Threshold Pulse Width: 0.5 ms
Lead Channel Pacing Threshold Pulse Width: 0.5 ms
Lead Channel Sensing Intrinsic Amplitude: 0.9 mV
Lead Channel Sensing Intrinsic Amplitude: 11.9 mV
Lead Channel Setting Pacing Amplitude: 2 V
Lead Channel Setting Pacing Amplitude: 2 V
Lead Channel Setting Pacing Amplitude: 2.5 V
Lead Channel Setting Pacing Pulse Width: 0.5 ms
Lead Channel Setting Pacing Pulse Width: 0.5 ms
Lead Channel Setting Sensing Sensitivity: 0.5 mV
Pulse Gen Serial Number: 810019794
Zone Setting Status: 755011

## 2024-01-24 MED ORDER — AMIODARONE HCL 200 MG PO TABS
200.0000 mg | ORAL_TABLET | Freq: Every day | ORAL | 4 refills | Status: AC
  Filled 2024-01-24: qty 30, 30d supply, fill #0
  Filled 2024-02-21: qty 30, 30d supply, fill #1
  Filled 2024-03-21: qty 30, 30d supply, fill #2
  Filled 2024-04-23: qty 30, 30d supply, fill #3
  Filled 2024-05-22: qty 30, 30d supply, fill #4

## 2024-01-24 NOTE — Telephone Encounter (Signed)
 *  STAT* If patient is at the pharmacy, call can be transferred to refill team.   1. Which medications need to be refilled? (please list name of each medication and dose if known)   amiodarone  (PACERONE ) 200 MG tablet   2. Which pharmacy/location (including street and city if local pharmacy) is medication to be sent to?  [place] [facility]    3. Do they need a 30 day or 90 day supply? 90 days   Patient is completely out of medication and has an appt on 01/26/24

## 2024-01-24 NOTE — Telephone Encounter (Signed)
 Spoke to the provider Daphne Barrack NP since his appt is 2 days away. Per Daphne Barrack NP, this RX is a long lasting RX and Pt must come in to receive any fills. No refill today.

## 2024-01-25 ENCOUNTER — Ambulatory Visit: Payer: Self-pay | Admitting: Cardiology

## 2024-01-25 NOTE — Progress Notes (Deleted)
  Electrophysiology Office Note:   Date:  01/25/2024  ID:  Cody Carpenter, DOB [birth year], MRN [id number]  Primary Cardiologist: Lynwood Schilling, MD Primary Heart Failure: None Electrophysiologist: OLE ONEIDA HOLTS, MD   {Click to update primary MD,subspecialty MD or APP then REFRESH:1}    History of Present Illness:   Cody Carpenter is a 59 y.o. male with h/o HFrEF / Dilated cardiomyopathy s/p ICD, NSVT, PVC's, HTN seen today for routine electrophysiology followup.   Since last being seen in our clinic the patient reports doing ***.    He denies chest pain, palpitations, dyspnea, PND, orthopnea, nausea, vomiting, dizziness, syncope, edema, weight gain, or early satiety.   Review of systems complete and found to be negative unless listed in HPI.      EP Information / Studies Reviewed:    EKG is not ordered today. EKG from 10/20/23 reviewed which showed AV dual paced 70 bpm      ICD Interrogation-  reviewed in detail today,  See PACEART report.  Device History: Abbott BiV ICD implanted 07/28/20 for HFrEF, LBBB History of appropriate therapy: No History of AAD therapy: Yes; currently on amiodarone , mexiletine    Risk Assessment/Calculations:     No BP recorded.  {Refresh Note OR Click here to enter BP  :1}***        Physical Exam:   VS:  There were no vitals taken for this visit.   Wt Readings from Last 3 Encounters:  10/20/23 233 lb (105.7 kg)  04/12/22 242 lb 6.4 oz (110 kg)  01/06/22 239 lb 12.8 oz (108.8 kg)     GEN: Well nourished, well developed in no acute distress NECK: No JVD; No carotid bruits CARDIAC: {EPRHYTHM:28826}, no murmurs, rubs, gallops RESPIRATORY:  Clear to auscultation without rales, wheezing or rhonchi  ABDOMEN: Soft, non-tender, non-distended EXTREMITIES:  No edema; No deformity   ASSESSMENT AND PLAN:    Chronic Systolic Dysfunction s/p Abbott CRT-D  PVC's  High Risk Medication Monitoring: Amiodarone   LVEF 20-25%  -euvolemic on exam / by  device *** -Stable on an appropriate medical regimen -Normal ICD function -See Pace Art report -No changes today -continue amiodarone , mexiletine  -amio labs from 10/2023 wnl     Disposition:   Follow up with Dr. HOLTS {EPFOLLOW LE:71826}   Signed, Daphne Barrack, NP-C, AGACNP-BC [facility] - Electrophysiology  01/25/2024, 2:31 PM

## 2024-01-25 NOTE — Progress Notes (Signed)
Remote ICD Transmission.

## 2024-01-26 ENCOUNTER — Ambulatory Visit: Admitting: Pulmonary Disease

## 2024-01-26 DIAGNOSIS — I420 Dilated cardiomyopathy: Secondary | ICD-10-CM

## 2024-01-26 DIAGNOSIS — I493 Ventricular premature depolarization: Secondary | ICD-10-CM

## 2024-01-26 DIAGNOSIS — I447 Left bundle-branch block, unspecified: Secondary | ICD-10-CM

## 2024-01-26 DIAGNOSIS — Z95810 Presence of automatic (implantable) cardiac defibrillator: Secondary | ICD-10-CM

## 2024-01-26 DIAGNOSIS — Z79899 Other long term (current) drug therapy: Secondary | ICD-10-CM

## 2024-01-26 DIAGNOSIS — I4729 Other ventricular tachycardia: Secondary | ICD-10-CM

## 2024-01-26 DIAGNOSIS — I5022 Chronic systolic (congestive) heart failure: Secondary | ICD-10-CM

## 2024-01-26 NOTE — Progress Notes (Signed)
Remote ICD Transmission.

## 2024-02-09 NOTE — Progress Notes (Unsigned)
  Electrophysiology Office Note:   Date:  02/09/2024  ID:  Cody Carpenter, DOB 06/03/1965, MRN [id number]  Primary Cardiologist: Lynwood Schilling, MD Primary Heart Failure: None Electrophysiologist: OLE ONEIDA HOLTS, MD   {Click to update primary MD,subspecialty MD or APP then REFRESH:1}    History of Present Illness:   Cody Carpenter is a 58 y.o. male with h/o HFrEF / Dilated cardiomyopathy s/p ICD, NSVT, PVC's, HTN seen today for routine electrophysiology followup.   Since last being seen in our clinic the patient reports doing ***.    He *** denies chest pain, palpitations, dyspnea, PND, orthopnea, nausea, vomiting, dizziness, syncope, edema, weight gain, or early satiety.   Review of systems complete and found to be negative unless listed in HPI.    EP Information / Studies Reviewed:    EKG is not ordered today. EKG from 10/20/23 reviewed which showed AV dual paced 70 bpm      ICD Interrogation-  reviewed in detail today,  See PACEART report.  Device History: Abbott BiV ICD implanted 07/28/20 for HFrEF, LBBB History of appropriate therapy: No History of AAD therapy: Yes; currently on amiodarone , mexiletine    Risk Assessment/Calculations:     No BP recorded.  {Refresh Note OR Click here to enter BP  :1}***        Physical Exam:   VS:  There were no vitals taken for this visit.   Wt Readings from Last 3 Encounters:  10/20/23 233 lb (105.7 kg)  04/12/22 242 lb 6.4 oz (110 kg)  01/06/22 239 lb 12.8 oz (108.8 kg)     GEN: Well nourished, well developed in no acute distress NECK: No JVD; No carotid bruits CARDIAC: {EPRHYTHM:28826}, no murmurs, rubs, gallops RESPIRATORY:  Clear to auscultation without rales, wheezing or rhonchi  ABDOMEN: Soft, non-tender, non-distended EXTREMITIES:  No edema; No deformity   ASSESSMENT AND PLAN:    Chronic Systolic Dysfunction s/p Abbott CRT-D  PVC's  High Risk Medication Monitoring: Amiodarone   LVEF 20-25%  -euvolemic on exam / by  device *** -Stable on an appropriate medical regimen -Normal ICD function -See Pace Art report -No changes today -continue amiodarone , mexiletine  -amio labs from 10/2023 wnl     Disposition:   Follow up with Dr. HOLTS {EPFOLLOW LE:71826}   Signed, Daphne Barrack, NP-C, AGACNP-BC [facility] - Electrophysiology  02/09/2024, 8:03 AM

## 2024-02-10 ENCOUNTER — Encounter: Payer: Self-pay | Admitting: Pulmonary Disease

## 2024-02-10 ENCOUNTER — Ambulatory Visit: Attending: Pulmonary Disease | Admitting: Pulmonary Disease

## 2024-02-10 VITALS — BP 90/58 | HR 71 | Ht 68.0 in | Wt 242.0 lb

## 2024-02-10 DIAGNOSIS — Z79899 Other long term (current) drug therapy: Secondary | ICD-10-CM | POA: Diagnosis present

## 2024-02-10 DIAGNOSIS — I10 Essential (primary) hypertension: Secondary | ICD-10-CM | POA: Insufficient documentation

## 2024-02-10 DIAGNOSIS — I5022 Chronic systolic (congestive) heart failure: Secondary | ICD-10-CM | POA: Diagnosis present

## 2024-02-10 DIAGNOSIS — R002 Palpitations: Secondary | ICD-10-CM | POA: Diagnosis present

## 2024-02-10 DIAGNOSIS — Z95810 Presence of automatic (implantable) cardiac defibrillator: Secondary | ICD-10-CM | POA: Diagnosis present

## 2024-02-10 DIAGNOSIS — I447 Left bundle-branch block, unspecified: Secondary | ICD-10-CM | POA: Diagnosis present

## 2024-02-10 DIAGNOSIS — I493 Ventricular premature depolarization: Secondary | ICD-10-CM | POA: Insufficient documentation

## 2024-02-10 LAB — CUP PACEART INCLINIC DEVICE CHECK
Date Time Interrogation Session: 20251010130335
Implantable Lead Connection Status: 753985
Implantable Lead Connection Status: 753985
Implantable Lead Connection Status: 753985
Implantable Lead Implant Date: 20220328
Implantable Lead Implant Date: 20220328
Implantable Lead Implant Date: 20220328
Implantable Lead Location: 753858
Implantable Lead Location: 753859
Implantable Lead Location: 753860
Implantable Pulse Generator Implant Date: 20220328
Pulse Gen Serial Number: 810019794

## 2024-02-10 NOTE — Patient Instructions (Addendum)
 Medication Instructions:    Your physician recommends that you continue on your current medications as directed. Please refer to the Current Medication list given to you today.  *If you need a refill on your cardiac medications before your next appointment, please call your pharmacy*   Lab Work: NONE ORDERED  TODAY   If you have labs (blood work) drawn today and your tests are completely normal, you will receive your results only by: MyChart Message (if you have MyChart) OR A paper copy in the mail If you have any lab test that is abnormal or we need to change your treatment, we will call you to review the results.   Testing/Procedures: NONE ORDERED  TODAY   Follow-Up: At [facility], you and your health needs are our priority.  As part of our continuing mission to provide you with exceptional heart care, our providers are all part of one team.  This team includes your primary Cardiologist (physician) and Advanced Practice Providers or APPs (Physician Assistants and Nurse Practitioners) who all work together to provide you with the care you need, when you need it.  Your next appointment:    1 year(s)  Provider:    You may see OLE ONEIDA HOLTS, MD  or one of the following Advanced Practice Providers on your designated Care Team:   Daphne Barrack, NP-C      We recommend signing up for the patient portal called MyChart.  Sign up information is provided on this After Visit Summary.  MyChart is used to connect with patients for Virtual Visits (Telemedicine).  Patients are able to view lab/test results, encounter notes, upcoming appointments, etc.  Non-urgent messages can be sent to your provider as well.   To learn more about what you can do with MyChart, go to [email or web address].   Other Instructions

## 2024-02-13 ENCOUNTER — Ambulatory Visit: Payer: Self-pay | Admitting: Cardiology

## 2024-02-21 ENCOUNTER — Other Ambulatory Visit: Payer: Self-pay

## 2024-02-29 ENCOUNTER — Other Ambulatory Visit: Payer: Self-pay

## 2024-03-03 ENCOUNTER — Other Ambulatory Visit: Payer: Self-pay

## 2024-03-03 ENCOUNTER — Emergency Department (HOSPITAL_COMMUNITY)
Admission: EM | Admit: 2024-03-03 | Discharge: 2024-03-04 | Disposition: A | Discharge status: Home / Self Care | Attending: Emergency Medicine | Admitting: Emergency Medicine

## 2024-03-03 ENCOUNTER — Emergency Department (HOSPITAL_COMMUNITY)

## 2024-03-03 ENCOUNTER — Encounter (HOSPITAL_COMMUNITY): Payer: Self-pay | Admitting: *Deleted

## 2024-03-03 DIAGNOSIS — J441 Chronic obstructive pulmonary disease with (acute) exacerbation: Secondary | ICD-10-CM | POA: Diagnosis not present

## 2024-03-03 DIAGNOSIS — R0902 Hypoxemia: Secondary | ICD-10-CM

## 2024-03-03 DIAGNOSIS — R0602 Shortness of breath: Secondary | ICD-10-CM | POA: Diagnosis present

## 2024-03-03 DIAGNOSIS — I110 Hypertensive heart disease with heart failure: Secondary | ICD-10-CM | POA: Insufficient documentation

## 2024-03-03 DIAGNOSIS — F17200 Nicotine dependence, unspecified, uncomplicated: Secondary | ICD-10-CM | POA: Diagnosis not present

## 2024-03-03 DIAGNOSIS — I5020 Unspecified systolic (congestive) heart failure: Secondary | ICD-10-CM | POA: Insufficient documentation

## 2024-03-03 DIAGNOSIS — R911 Solitary pulmonary nodule: Secondary | ICD-10-CM | POA: Insufficient documentation

## 2024-03-03 DIAGNOSIS — Z95810 Presence of automatic (implantable) cardiac defibrillator: Secondary | ICD-10-CM | POA: Diagnosis not present

## 2024-03-03 LAB — BASIC METABOLIC PANEL WITH GFR
Anion gap: 10 (ref 5–15)
BUN: 22 mg/dL — ABNORMAL HIGH (ref 6–20)
CO2: 22 mmol/L (ref 22–32)
Calcium: 9 mg/dL (ref 8.9–10.3)
Chloride: 105 mmol/L (ref 98–111)
Creatinine, Ser: 1.5 mg/dL — ABNORMAL HIGH (ref 0.61–1.24)
GFR, Estimated: 54 mL/min — ABNORMAL LOW (ref >60)
Glucose, Bld: 115 mg/dL — ABNORMAL HIGH (ref 70–99)
Potassium: 4.3 mmol/L (ref 3.5–5.1)
Sodium: 137 mmol/L (ref 135–145)

## 2024-03-03 LAB — CBC
HCT: 42 % (ref 39.0–52.0)
Hemoglobin: 13.4 g/dL (ref 13.0–17.0)
MCH: 30.7 pg (ref 26.0–34.0)
MCHC: 31.9 g/dL (ref 30.0–36.0)
MCV: 96.3 fL (ref 80.0–100.0)
Platelets: 210 K/uL (ref 150–400)
RBC: 4.36 MIL/uL (ref 4.22–5.81)
RDW: 13.3 % (ref 11.5–15.5)
WBC: 10.2 K/uL (ref 4.0–10.5)
nRBC: 0 % (ref 0.0–0.2)

## 2024-03-03 LAB — BRAIN NATRIURETIC PEPTIDE: B Natriuretic Peptide: 39.5 pg/mL (ref 0.0–100.0)

## 2024-03-03 LAB — D-DIMER, QUANTITATIVE: D-Dimer, Quant: 1.17 ug{FEU}/mL — ABNORMAL HIGH (ref 0.00–0.50)

## 2024-03-03 LAB — TROPONIN I (HIGH SENSITIVITY): Troponin I (High Sensitivity): 6 ng/L (ref <18)

## 2024-03-03 MED ORDER — AMOXICILLIN-POT CLAVULANATE 875-125 MG PO TABS
1.0000 | ORAL_TABLET | Freq: Two times a day (BID) | ORAL | 0 refills | Status: AC
  Filled 2024-03-03: qty 14, 7d supply, fill #0

## 2024-03-03 MED ORDER — AZITHROMYCIN 250 MG PO TABS
250.0000 mg | ORAL_TABLET | Freq: Every day | ORAL | 0 refills | Status: AC
  Filled 2024-03-03: qty 4, 3d supply, fill #0

## 2024-03-03 MED ORDER — METHYLPREDNISOLONE SODIUM SUCC 125 MG IJ SOLR
125.0000 mg | Freq: Once | INTRAMUSCULAR | Status: AC
  Administered 2024-03-03: 125 mg via INTRAVENOUS
  Filled 2024-03-03: qty 2

## 2024-03-03 MED ORDER — IPRATROPIUM-ALBUTEROL 0.5-2.5 (3) MG/3ML IN SOLN
3.0000 mL | Freq: Once | RESPIRATORY_TRACT | Status: AC
  Administered 2024-03-03: 3 mL via RESPIRATORY_TRACT
  Filled 2024-03-03: qty 3

## 2024-03-03 MED ORDER — ALBUTEROL SULFATE HFA 108 (90 BASE) MCG/ACT IN AERS
6.0000 | INHALATION_SPRAY | Freq: Once | RESPIRATORY_TRACT | Status: AC
  Administered 2024-03-03: 6 via RESPIRATORY_TRACT
  Filled 2024-03-03: qty 6.7

## 2024-03-03 MED ORDER — IOHEXOL 350 MG/ML SOLN
75.0000 mL | Freq: Once | INTRAVENOUS | Status: AC | PRN
Start: 2024-03-03 — End: 2024-03-03
  Administered 2024-03-03: 75 mL via INTRAVENOUS

## 2024-03-03 MED ORDER — SODIUM CHLORIDE 0.9 % IV SOLN
500.0000 mg | Freq: Once | INTRAVENOUS | Status: AC
  Administered 2024-03-03: 500 mg via INTRAVENOUS
  Filled 2024-03-03: qty 5

## 2024-03-03 MED ORDER — PREDNISONE 20 MG PO TABS
40.0000 mg | ORAL_TABLET | Freq: Every day | ORAL | 0 refills | Status: AC
  Filled 2024-03-03: qty 5, 2d supply, fill #0

## 2024-03-03 NOTE — ED Notes (Signed)
 Warm blankets provided.

## 2024-03-03 NOTE — ED Notes (Signed)
 Nasal 02 at 2 liters for a sat of 93%

## 2024-03-03 NOTE — Discharge Instructions (Addendum)
 Cody Carpenter:  Thank you for allowing us  to take care of you today.  We hope you begin feeling better soon. You were seen today for shortness of breath, cough, increased butyrin production.  While you were here we performed lab work, imaging, EKG studies as well as CT and x-ray studies.  There is no emergent cause of your symptoms today.  Specifically no signs of a focal consolidation concerning for pneumonia, no blood clots, no acute cardiac pathology.    Your CT scan showed an incidental small pulmonary nodule.  Follow-up with your PCP for further surveillance.    You received treatment for COPD exacerbation.  Please take both antibiotics azithromycin once daily for the next 4 days and then Augmentin twice daily for the next 7 days.  Additionally please take prednisone 40 mg daily for the next 5 days.  Use the inhaler as needed for increased work of breathing. To-Do:  Please follow-up with your primary doctor within the next 2-3 days. It is important that you review any labs or imaging results (if any) that you had today with them. Your preliminary imaging results (if any) are attached. Please return to the Emergency Department or call 911 if you experience chest pain, shortness of breath, severe pain, severe fever, altered mental status, or have any reason to think that you need emergency medical care.  Thank you again.  Hope you feel better soon.  Department of Emergency Medicine

## 2024-03-03 NOTE — ED Triage Notes (Signed)
 The pt is c/o sob for 2-3 days  he has a history of chf and he thinks that is the reason for the sob now

## 2024-03-03 NOTE — ED Provider Notes (Signed)
  [place] EMERGENCY DEPARTMENT AT [facility] Provider Note  MDM   HPI/ROS:  Cody Carpenter is a 58 y.o. male with a PMHx of HFrEF/dilated cardiomyopathy s/p ICD, NSVT, PVCs, HTN, tobacco use who presents to the ED for approximately 7 days of cough, congestion, shortness of breath.  He states that he has become worsening shortness of breath with productive cough, denies any fevers or chills.  DDx includes but is not limited to CHF exacerbation, COPD exacerbation, infection, pneumonia, ACS  Physical exam is notable for: - Patient is euvolemic, no lower extremity edema, no periorbital edema, no abdominal distention or tenderness. -- Lungs with right sided rhonchi that clear with cough as well as slight end expiratory wheeze --   On my initial evaluation, patient is:  -Vital signs stable. Patient afebrile, hemodynamically stable, and non-toxic appearing. -Additional history obtained from   This patient's current presentation, including their history and physical exam, is most consistent with ***. Differentials include ***.     Interpretations, interventions, and the patient's course of care are documented below.      ***   Disposition:  {ED Dispo:29898}  Clinical Impression: No diagnosis found.  Rx / DC Orders ED Discharge Orders     None       The plan for this patient was discussed with Dr. ***, who voiced agreement and who oversaw evaluation and treatment of this patient.   Clinical Complexity A medically appropriate history, review of systems, and physical exam was performed.  My independent interpretations of EKG, labs, and radiology are documented in the ED course above.   If decision rules were used in this patient's evaluation, they are listed below.  *** Click here for ABCD2, HEART and other calculatorsREFRESH Note before signing   Patient's presentation is most consistent with {EM COPA:27473}  Medical Decision Making Amount and/or Complexity of  Data Reviewed Labs: ordered. Radiology: ordered.  Risk Prescription drug management.    HPI/ROS      See MDM section for pertinent HPI and ROS. A complete ROS was performed with pertinent positives/negatives noted above.   Past Medical History:  Diagnosis Date   CHF (congestive heart failure) (HCC)    Hypertension    Sleep apnea    CPAP    Past Surgical History:  Procedure Laterality Date   BIV ICD INSERTION CRT-D N/A 07/28/2020   Procedure: BIV ICD INSERTION CRT-D;  Surgeon: Cindie Ole DASEN, MD;  Location: [facility] INVASIVE CV LAB;  Service: Cardiovascular;  Laterality: N/A;   NO PAST SURGERIES        Physical Exam   Vitals:   03/03/24 1907 03/03/24 1938  BP: 122/73   Pulse: 72   Resp: (!) 23   Temp: 98.4 F (36.9 C)   SpO2: 93%   Weight:  109.8 kg  Height:  5' 8 (1.727 m)    Physical Exam   Procedures   If procedures were preformed on this patient, they are listed below:  Procedures   @BBSIG @   Please note that this documentation was produced with the assistance of voice-to-text technology and may contain errors.

## 2024-03-04 ENCOUNTER — Other Ambulatory Visit (HOSPITAL_COMMUNITY): Payer: Self-pay

## 2024-03-04 NOTE — ED Provider Notes (Signed)
 Patient here with shortness of breath.  Smoking history.  Differential diagnosis likely bronchitis versus less likely PE ACS.  No signs of volume overload on exam.  No significant leukocytosis anemia or electrolyte abnormality.  Troponin normal.  BNP normal.  D-dimer elevated.  Got a CT scan of the chest that showed multifocal infiltrates.  Overall patient given IV antibiotics here steroids breathing treatments.  I do think patient is stable for discharge for home.  He has no fever no white count.  I do think that this is likely bronchitis has not developed into pneumonia.  Will do steroids breathing treatments antibiotics for home.  Understands return precautions.  Discharge.  This chart was dictated using voice recognition software.  Despite best efforts to proofread,  errors can occur which can change the documentation meaning.    Ruthe Cornet, DO 03/04/24 0004

## 2024-03-09 ENCOUNTER — Other Ambulatory Visit (HOSPITAL_COMMUNITY): Payer: Self-pay

## 2024-03-09 MED ORDER — ALBUTEROL SULFATE HFA 108 (90 BASE) MCG/ACT IN AERS
2.0000 | INHALATION_SPRAY | RESPIRATORY_TRACT | 3 refills | Status: AC
  Filled 2024-03-09: qty 6.7, 16d supply, fill #0

## 2024-03-26 ENCOUNTER — Other Ambulatory Visit: Payer: Self-pay

## 2024-04-24 ENCOUNTER — Other Ambulatory Visit: Payer: Self-pay

## 2024-04-24 ENCOUNTER — Ambulatory Visit: Payer: Medicaid Other

## 2024-04-24 DIAGNOSIS — I447 Left bundle-branch block, unspecified: Secondary | ICD-10-CM

## 2024-04-24 LAB — CUP PACEART REMOTE DEVICE CHECK
Battery Remaining Longevity: 42 mo
Battery Remaining Percentage: 50 %
Battery Voltage: 2.95 V
Brady Statistic AP VP Percent: 86 %
Brady Statistic AP VS Percent: 1.3 %
Brady Statistic AS VP Percent: 12 %
Brady Statistic AS VS Percent: 1 %
Brady Statistic RA Percent Paced: 87 %
Date Time Interrogation Session: 20251223020056
HighPow Impedance: 88 Ohm
Implantable Lead Connection Status: 753985
Implantable Lead Connection Status: 753985
Implantable Lead Connection Status: 753985
Implantable Lead Implant Date: 20220328
Implantable Lead Implant Date: 20220328
Implantable Lead Implant Date: 20220328
Implantable Lead Location: 753858
Implantable Lead Location: 753859
Implantable Lead Location: 753860
Implantable Pulse Generator Implant Date: 20220328
Lead Channel Impedance Value: 1275 Ohm
Lead Channel Impedance Value: 390 Ohm
Lead Channel Impedance Value: 430 Ohm
Lead Channel Pacing Threshold Amplitude: 0.75 V
Lead Channel Pacing Threshold Amplitude: 0.75 V
Lead Channel Pacing Threshold Amplitude: 1.25 V
Lead Channel Pacing Threshold Pulse Width: 0.5 ms
Lead Channel Pacing Threshold Pulse Width: 0.5 ms
Lead Channel Pacing Threshold Pulse Width: 0.5 ms
Lead Channel Sensing Intrinsic Amplitude: 0.7 mV
Lead Channel Sensing Intrinsic Amplitude: 12 mV
Lead Channel Setting Pacing Amplitude: 2 V
Lead Channel Setting Pacing Amplitude: 2 V
Lead Channel Setting Pacing Amplitude: 2.5 V
Lead Channel Setting Pacing Pulse Width: 0.5 ms
Lead Channel Setting Pacing Pulse Width: 0.5 ms
Lead Channel Setting Sensing Sensitivity: 0.5 mV
Pulse Gen Serial Number: 810019794
Zone Setting Status: 755011

## 2024-04-25 NOTE — Progress Notes (Signed)
 Remote ICD Transmission

## 2024-04-28 ENCOUNTER — Ambulatory Visit: Payer: Self-pay | Admitting: Cardiology

## 2024-05-18 ENCOUNTER — Other Ambulatory Visit (HOSPITAL_COMMUNITY): Payer: Self-pay

## 2024-05-19 ENCOUNTER — Other Ambulatory Visit (HOSPITAL_COMMUNITY): Payer: Self-pay

## 2024-05-22 ENCOUNTER — Other Ambulatory Visit: Payer: Self-pay

## 2024-06-06 ENCOUNTER — Other Ambulatory Visit: Payer: Self-pay | Admitting: Cardiology

## 2024-06-06 ENCOUNTER — Other Ambulatory Visit (HOSPITAL_COMMUNITY): Payer: Self-pay

## 2024-06-06 MED ORDER — SACUBITRIL-VALSARTAN 97-103 MG PO TABS
1.0000 | ORAL_TABLET | Freq: Two times a day (BID) | ORAL | 5 refills | Status: AC
  Filled 2024-06-06: qty 60, 30d supply, fill #0

## 2024-07-24 ENCOUNTER — Ambulatory Visit

## 2024-10-23 ENCOUNTER — Ambulatory Visit

## 2025-01-22 ENCOUNTER — Ambulatory Visit

## 2025-04-23 ENCOUNTER — Ambulatory Visit
